# Patient Record
Sex: Male | Born: 1935 | Hispanic: No | Smoking: Former smoker
Health system: Southern US, Community
[De-identification: ages and names within clinical notes are randomized; demographics above are authoritative.]

## PROBLEM LIST (undated history)

## (undated) DIAGNOSIS — K222 Esophageal obstruction: Secondary | ICD-10-CM

## (undated) DIAGNOSIS — N400 Enlarged prostate without lower urinary tract symptoms: Secondary | ICD-10-CM

## (undated) DIAGNOSIS — R972 Elevated prostate specific antigen [PSA]: Secondary | ICD-10-CM

## (undated) DIAGNOSIS — K648 Other hemorrhoids: Secondary | ICD-10-CM

## (undated) DIAGNOSIS — F419 Anxiety disorder, unspecified: Secondary | ICD-10-CM

## (undated) DIAGNOSIS — K4090 Unilateral inguinal hernia, without obstruction or gangrene, not specified as recurrent: Secondary | ICD-10-CM

## (undated) DIAGNOSIS — G629 Polyneuropathy, unspecified: Secondary | ICD-10-CM

## (undated) DIAGNOSIS — M1990 Unspecified osteoarthritis, unspecified site: Secondary | ICD-10-CM

## (undated) DIAGNOSIS — I639 Cerebral infarction, unspecified: Secondary | ICD-10-CM

## (undated) DIAGNOSIS — Z860100 Personal history of colon polyps, unspecified: Secondary | ICD-10-CM

## (undated) DIAGNOSIS — D649 Anemia, unspecified: Secondary | ICD-10-CM

## (undated) DIAGNOSIS — I10 Essential (primary) hypertension: Secondary | ICD-10-CM

## (undated) DIAGNOSIS — E785 Hyperlipidemia, unspecified: Secondary | ICD-10-CM

## (undated) DIAGNOSIS — K449 Diaphragmatic hernia without obstruction or gangrene: Secondary | ICD-10-CM

## (undated) DIAGNOSIS — K8050 Calculus of bile duct without cholangitis or cholecystitis without obstruction: Secondary | ICD-10-CM

## (undated) DIAGNOSIS — K823 Fistula of gallbladder: Secondary | ICD-10-CM

## (undated) DIAGNOSIS — K219 Gastro-esophageal reflux disease without esophagitis: Secondary | ICD-10-CM

## (undated) HISTORY — DX: Gastro-esophageal reflux disease without esophagitis: K21.9

## (undated) HISTORY — DX: Anemia, unspecified: D64.9

## (undated) HISTORY — DX: Other hemorrhoids: K64.8

## (undated) HISTORY — PX: ERCP W/ SPHICTEROTOMY: SHX1523

## (undated) HISTORY — DX: Benign prostatic hyperplasia without lower urinary tract symptoms: N40.0

## (undated) HISTORY — DX: Anxiety disorder, unspecified: F41.9

## (undated) HISTORY — DX: Personal history of colonic polyps: Z86.010

## (undated) HISTORY — DX: Polyneuropathy, unspecified: G62.9

## (undated) HISTORY — DX: Calculus of bile duct without cholangitis or cholecystitis without obstruction: K80.50

## (undated) HISTORY — DX: Unilateral inguinal hernia, without obstruction or gangrene, not specified as recurrent: K40.90

## (undated) HISTORY — DX: Elevated prostate specific antigen (PSA): R97.20

## (undated) HISTORY — DX: Unspecified osteoarthritis, unspecified site: M19.90

## (undated) HISTORY — DX: Cerebral infarction, unspecified: I63.9

## (undated) HISTORY — PX: HERNIA REPAIR: SHX51

## (undated) HISTORY — PX: OTHER SURGICAL HISTORY: SHX169

## (undated) HISTORY — DX: Essential (primary) hypertension: I10

## (undated) HISTORY — DX: Hyperlipidemia, unspecified: E78.5

## (undated) HISTORY — DX: Personal history of colon polyps, unspecified: Z86.0100

## (undated) HISTORY — DX: Fistula of gallbladder: K82.3

## (undated) HISTORY — PX: CATARACT EXTRACTION, BILATERAL: SHX1313

## (undated) HISTORY — DX: Esophageal obstruction: K22.2

## (undated) HISTORY — DX: Diaphragmatic hernia without obstruction or gangrene: K44.9

---

## 1997-04-12 ENCOUNTER — Encounter: Admission: RE | Admit: 1997-04-12 | Discharge: 1997-04-12 | Payer: Self-pay | Admitting: Hematology and Oncology

## 2001-03-17 ENCOUNTER — Encounter: Payer: Self-pay | Admitting: Gastroenterology

## 2001-03-17 ENCOUNTER — Ambulatory Visit (HOSPITAL_COMMUNITY): Admission: RE | Admit: 2001-03-17 | Discharge: 2001-03-17 | Payer: Self-pay | Admitting: Gastroenterology

## 2001-08-23 ENCOUNTER — Ambulatory Visit (HOSPITAL_COMMUNITY): Admission: RE | Admit: 2001-08-23 | Discharge: 2001-08-23 | Payer: Self-pay | Admitting: Internal Medicine

## 2001-08-23 ENCOUNTER — Encounter: Payer: Self-pay | Admitting: Internal Medicine

## 2001-10-12 ENCOUNTER — Ambulatory Visit (HOSPITAL_COMMUNITY): Admission: RE | Admit: 2001-10-12 | Discharge: 2001-10-12 | Payer: Self-pay | Admitting: Ophthalmology

## 2001-10-17 ENCOUNTER — Ambulatory Visit (HOSPITAL_COMMUNITY): Admission: RE | Admit: 2001-10-17 | Discharge: 2001-10-17 | Payer: Self-pay | Admitting: Ophthalmology

## 2003-10-17 ENCOUNTER — Encounter: Admission: RE | Admit: 2003-10-17 | Discharge: 2003-10-17 | Payer: Self-pay | Admitting: Family Medicine

## 2004-06-10 ENCOUNTER — Ambulatory Visit: Payer: Self-pay | Admitting: Internal Medicine

## 2004-06-13 ENCOUNTER — Ambulatory Visit: Payer: Self-pay

## 2004-06-24 ENCOUNTER — Ambulatory Visit: Payer: Self-pay | Admitting: Internal Medicine

## 2004-10-16 ENCOUNTER — Ambulatory Visit: Payer: Self-pay | Admitting: Internal Medicine

## 2004-10-22 ENCOUNTER — Ambulatory Visit: Payer: Self-pay | Admitting: Internal Medicine

## 2004-10-23 ENCOUNTER — Ambulatory Visit: Payer: Self-pay | Admitting: Internal Medicine

## 2005-03-09 ENCOUNTER — Ambulatory Visit: Payer: Self-pay | Admitting: Internal Medicine

## 2005-04-27 ENCOUNTER — Ambulatory Visit: Payer: Self-pay | Admitting: Internal Medicine

## 2005-06-10 ENCOUNTER — Ambulatory Visit: Payer: Self-pay | Admitting: Internal Medicine

## 2005-09-02 ENCOUNTER — Ambulatory Visit: Payer: Self-pay | Admitting: Internal Medicine

## 2005-09-21 ENCOUNTER — Ambulatory Visit: Payer: Self-pay | Admitting: Family Medicine

## 2006-03-08 ENCOUNTER — Ambulatory Visit: Payer: Self-pay | Admitting: Internal Medicine

## 2006-04-16 ENCOUNTER — Ambulatory Visit (HOSPITAL_COMMUNITY): Admission: RE | Admit: 2006-04-16 | Discharge: 2006-04-16 | Payer: Self-pay | Admitting: Endocrinology

## 2006-04-21 ENCOUNTER — Inpatient Hospital Stay (HOSPITAL_COMMUNITY): Admission: RE | Admit: 2006-04-21 | Discharge: 2006-04-25 | Payer: Self-pay | Admitting: Orthopedic Surgery

## 2006-04-25 ENCOUNTER — Encounter: Payer: Self-pay | Admitting: Internal Medicine

## 2006-05-06 HISTORY — PX: TOTAL KNEE ARTHROPLASTY: SHX125

## 2006-05-17 ENCOUNTER — Encounter: Admission: RE | Admit: 2006-05-17 | Discharge: 2006-07-05 | Payer: Self-pay | Admitting: Orthopedic Surgery

## 2006-05-17 DIAGNOSIS — K219 Gastro-esophageal reflux disease without esophagitis: Secondary | ICD-10-CM | POA: Insufficient documentation

## 2006-05-17 DIAGNOSIS — N400 Enlarged prostate without lower urinary tract symptoms: Secondary | ICD-10-CM | POA: Insufficient documentation

## 2006-05-17 DIAGNOSIS — K222 Esophageal obstruction: Secondary | ICD-10-CM | POA: Insufficient documentation

## 2006-05-17 DIAGNOSIS — I10 Essential (primary) hypertension: Secondary | ICD-10-CM | POA: Insufficient documentation

## 2006-05-17 DIAGNOSIS — J309 Allergic rhinitis, unspecified: Secondary | ICD-10-CM | POA: Insufficient documentation

## 2006-05-18 ENCOUNTER — Ambulatory Visit: Payer: Self-pay | Admitting: Internal Medicine

## 2006-05-18 LAB — CONVERTED CEMR LAB
BUN: 8 mg/dL (ref 6–23)
Creatinine, Ser: 0.7 mg/dL (ref 0.4–1.5)
Hgb A1c MFr Bld: 5.2 % (ref 4.6–6.0)
Potassium: 4 meq/L (ref 3.5–5.1)

## 2006-06-28 ENCOUNTER — Ambulatory Visit: Payer: Self-pay | Admitting: Internal Medicine

## 2006-06-28 DIAGNOSIS — D126 Benign neoplasm of colon, unspecified: Secondary | ICD-10-CM | POA: Insufficient documentation

## 2006-06-30 LAB — CONVERTED CEMR LAB
ALT: 15 units/L (ref 0–40)
AST: 22 units/L (ref 0–37)
Albumin: 3.9 g/dL (ref 3.5–5.2)
Alkaline Phosphatase: 54 units/L (ref 39–117)
Basophils Absolute: 0 10*3/uL (ref 0.0–0.1)
Basophils Relative: 0.8 % (ref 0.0–1.0)
Bilirubin, Direct: 0.2 mg/dL (ref 0.0–0.3)
Cholesterol: 176 mg/dL (ref 0–200)
Eosinophils Absolute: 0.2 10*3/uL (ref 0.0–0.6)
Eosinophils Relative: 3.5 % (ref 0.0–5.0)
HCT: 40.3 % (ref 39.0–52.0)
HDL: 39.3 mg/dL (ref >39.0)
Hemoglobin: 13.9 g/dL (ref 13.0–17.0)
LDL Cholesterol: 100 mg/dL — ABNORMAL HIGH (ref 0–99)
Lymphocytes Relative: 32.1 % (ref 12.0–46.0)
MCHC: 34.5 g/dL (ref 30.0–36.0)
MCV: 91.9 fL (ref 78.0–100.0)
Monocytes Absolute: 0.6 10*3/uL (ref 0.2–0.7)
Monocytes Relative: 10.1 % (ref 3.0–11.0)
Neutro Abs: 3.1 10*3/uL (ref 1.4–7.7)
Neutrophils Relative %: 53.5 % (ref 43.0–77.0)
Platelets: 217 10*3/uL (ref 150–400)
RBC: 4.38 M/uL (ref 4.22–5.81)
RDW: 15.1 % — ABNORMAL HIGH (ref 11.5–14.6)
TSH: 2.63 microintl units/mL (ref 0.35–5.50)
Total Bilirubin: 1.4 mg/dL — ABNORMAL HIGH (ref 0.3–1.2)
Total CHOL/HDL Ratio: 4.5
Total Protein: 7.3 g/dL (ref 6.0–8.3)
Triglycerides: 185 mg/dL — ABNORMAL HIGH (ref 0–149)
VLDL: 37 mg/dL (ref 0–40)
WBC: 5.8 10*3/uL (ref 4.5–10.5)

## 2006-08-06 HISTORY — PX: TOTAL KNEE ARTHROPLASTY: SHX125

## 2006-08-16 ENCOUNTER — Ambulatory Visit: Payer: Self-pay | Admitting: Gastroenterology

## 2006-08-17 ENCOUNTER — Ambulatory Visit: Payer: Self-pay | Admitting: Gastroenterology

## 2006-08-20 ENCOUNTER — Encounter: Payer: Self-pay | Admitting: Internal Medicine

## 2006-08-20 ENCOUNTER — Encounter: Payer: Self-pay | Admitting: Gastroenterology

## 2006-08-20 ENCOUNTER — Ambulatory Visit: Payer: Self-pay | Admitting: Gastroenterology

## 2006-08-20 LAB — HM COLONOSCOPY: HM Colonoscopy: NORMAL

## 2006-08-24 ENCOUNTER — Ambulatory Visit: Payer: Self-pay | Admitting: Internal Medicine

## 2006-08-24 DIAGNOSIS — M1990 Unspecified osteoarthritis, unspecified site: Secondary | ICD-10-CM | POA: Insufficient documentation

## 2006-08-24 DIAGNOSIS — D649 Anemia, unspecified: Secondary | ICD-10-CM | POA: Insufficient documentation

## 2006-08-25 ENCOUNTER — Encounter: Payer: Self-pay | Admitting: Internal Medicine

## 2006-09-15 ENCOUNTER — Inpatient Hospital Stay (HOSPITAL_COMMUNITY): Admission: RE | Admit: 2006-09-15 | Discharge: 2006-09-18 | Payer: Self-pay | Admitting: Orthopedic Surgery

## 2006-10-04 ENCOUNTER — Ambulatory Visit: Payer: Self-pay | Admitting: Internal Medicine

## 2006-10-06 ENCOUNTER — Encounter (INDEPENDENT_AMBULATORY_CARE_PROVIDER_SITE_OTHER): Payer: Self-pay | Admitting: *Deleted

## 2006-10-06 LAB — CONVERTED CEMR LAB
BUN: 10 mg/dL (ref 6–23)
CO2: 31 meq/L (ref 19–32)
Calcium: 8.9 mg/dL (ref 8.4–10.5)
Chloride: 96 meq/L (ref 96–112)
Creatinine, Ser: 0.7 mg/dL (ref 0.4–1.5)
GFR calc Af Amer: 140 mL/min
GFR calc non Af Amer: 116 mL/min
Glucose, Bld: 116 mg/dL — ABNORMAL HIGH (ref 70–99)
Hemoglobin: 12.5 g/dL — ABNORMAL LOW (ref 13.0–17.0)
Potassium: 3.8 meq/L (ref 3.5–5.1)
Sodium: 132 meq/L — ABNORMAL LOW (ref 135–145)

## 2006-10-11 ENCOUNTER — Encounter: Admission: RE | Admit: 2006-10-11 | Discharge: 2006-11-24 | Payer: Self-pay | Admitting: Orthopedic Surgery

## 2006-10-25 ENCOUNTER — Ambulatory Visit: Payer: Self-pay | Admitting: Gastroenterology

## 2006-11-04 ENCOUNTER — Ambulatory Visit: Payer: Self-pay | Admitting: Internal Medicine

## 2006-11-04 DIAGNOSIS — R42 Dizziness and giddiness: Secondary | ICD-10-CM | POA: Insufficient documentation

## 2006-11-04 LAB — CONVERTED CEMR LAB: Hemoglobin: 14.4 g/dL

## 2006-11-09 ENCOUNTER — Ambulatory Visit: Payer: Self-pay | Admitting: Cardiology

## 2007-03-10 DIAGNOSIS — K648 Other hemorrhoids: Secondary | ICD-10-CM | POA: Insufficient documentation

## 2007-03-10 DIAGNOSIS — I6350 Cerebral infarction due to unspecified occlusion or stenosis of unspecified cerebral artery: Secondary | ICD-10-CM | POA: Insufficient documentation

## 2007-05-04 ENCOUNTER — Encounter: Payer: Self-pay | Admitting: Internal Medicine

## 2007-05-23 ENCOUNTER — Ambulatory Visit: Payer: Self-pay | Admitting: Internal Medicine

## 2007-05-23 ENCOUNTER — Telehealth (INDEPENDENT_AMBULATORY_CARE_PROVIDER_SITE_OTHER): Payer: Self-pay | Admitting: *Deleted

## 2007-06-02 ENCOUNTER — Telehealth (INDEPENDENT_AMBULATORY_CARE_PROVIDER_SITE_OTHER): Payer: Self-pay | Admitting: *Deleted

## 2007-06-02 ENCOUNTER — Ambulatory Visit: Payer: Self-pay | Admitting: Internal Medicine

## 2007-06-02 DIAGNOSIS — E785 Hyperlipidemia, unspecified: Secondary | ICD-10-CM | POA: Insufficient documentation

## 2007-06-02 DIAGNOSIS — R739 Hyperglycemia, unspecified: Secondary | ICD-10-CM | POA: Insufficient documentation

## 2007-06-06 ENCOUNTER — Telehealth (INDEPENDENT_AMBULATORY_CARE_PROVIDER_SITE_OTHER): Payer: Self-pay | Admitting: *Deleted

## 2007-06-10 ENCOUNTER — Telehealth (INDEPENDENT_AMBULATORY_CARE_PROVIDER_SITE_OTHER): Payer: Self-pay | Admitting: *Deleted

## 2007-09-14 ENCOUNTER — Ambulatory Visit: Payer: Self-pay | Admitting: Internal Medicine

## 2007-09-16 ENCOUNTER — Ambulatory Visit: Payer: Self-pay | Admitting: Internal Medicine

## 2007-09-22 ENCOUNTER — Telehealth (INDEPENDENT_AMBULATORY_CARE_PROVIDER_SITE_OTHER): Payer: Self-pay | Admitting: *Deleted

## 2007-09-22 LAB — CONVERTED CEMR LAB
ALT: 21 units/L (ref 0–53)
AST: 25 units/L (ref 0–37)
BUN: 12 mg/dL (ref 6–23)
CO2: 29 meq/L (ref 19–32)
Calcium: 8.9 mg/dL (ref 8.4–10.5)
Chloride: 104 meq/L (ref 96–112)
Cholesterol: 87 mg/dL (ref 0–200)
Creatinine, Ser: 0.8 mg/dL (ref 0.4–1.5)
GFR calc Af Amer: 120 mL/min
GFR calc non Af Amer: 99 mL/min
Glucose, Bld: 103 mg/dL — ABNORMAL HIGH (ref 70–99)
HDL: 30.6 mg/dL — ABNORMAL LOW (ref >39.0)
Hgb A1c MFr Bld: 5.4 % (ref 4.6–6.0)
LDL Cholesterol: 40 mg/dL (ref 0–99)
Potassium: 3.9 meq/L (ref 3.5–5.1)
Sodium: 138 meq/L (ref 135–145)
Total CHOL/HDL Ratio: 2.8
Triglycerides: 80 mg/dL (ref 0–149)
VLDL: 16 mg/dL (ref 0–40)
Vit D, 1,25-Dihydroxy: 58 (ref 30–89)

## 2007-09-28 ENCOUNTER — Ambulatory Visit: Payer: Self-pay | Admitting: Internal Medicine

## 2007-11-10 ENCOUNTER — Encounter: Payer: Self-pay | Admitting: Internal Medicine

## 2007-11-10 ENCOUNTER — Ambulatory Visit: Payer: Self-pay | Admitting: Internal Medicine

## 2007-11-28 ENCOUNTER — Encounter (INDEPENDENT_AMBULATORY_CARE_PROVIDER_SITE_OTHER): Payer: Self-pay | Admitting: *Deleted

## 2007-12-19 ENCOUNTER — Ambulatory Visit: Payer: Self-pay | Admitting: Internal Medicine

## 2007-12-19 LAB — CONVERTED CEMR LAB
Bilirubin Urine: NEGATIVE
Blood in Urine, dipstick: NEGATIVE
Glucose, Urine, Semiquant: NEGATIVE
Ketones, urine, test strip: NEGATIVE
Nitrite: NEGATIVE
Protein, U semiquant: NEGATIVE
Specific Gravity, Urine: 1.01
Urobilinogen, UA: 0.2
WBC Urine, dipstick: NEGATIVE
pH: 6

## 2008-01-18 ENCOUNTER — Telehealth (INDEPENDENT_AMBULATORY_CARE_PROVIDER_SITE_OTHER): Payer: Self-pay | Admitting: *Deleted

## 2008-01-19 ENCOUNTER — Telehealth (INDEPENDENT_AMBULATORY_CARE_PROVIDER_SITE_OTHER): Payer: Self-pay | Admitting: *Deleted

## 2008-03-13 ENCOUNTER — Encounter: Payer: Self-pay | Admitting: Internal Medicine

## 2008-03-14 ENCOUNTER — Encounter: Payer: Self-pay | Admitting: Internal Medicine

## 2008-03-14 LAB — CONVERTED CEMR LAB
ALT: 55 units/L
AST: 35 units/L
Alkaline Phosphatase: 53 units/L
BUN: 17 mg/dL
Calcium: 9 mg/dL
Cholesterol: 117 mg/dL
Creatinine, Ser: 0.9 mg/dL
Glucose, Bld: 120 mg/dL
HDL: 38 mg/dL
Hemoglobin: 13.5 g/dL
Hgb A1c MFr Bld: 5.7 %
LDL Cholesterol: 61 mg/dL
Platelets: 168 10*3/uL
Potassium, serum: 3.4 mmol/L
Sed Rate: 27 mm/hr
Total Bilirubin: 1.3 mg/dL
Triglycerides: 91 mg/dL
WBC, blood: 5.33 10*3/uL

## 2008-03-26 ENCOUNTER — Ambulatory Visit: Payer: Self-pay | Admitting: Internal Medicine

## 2008-03-26 DIAGNOSIS — F411 Generalized anxiety disorder: Secondary | ICD-10-CM | POA: Insufficient documentation

## 2008-03-26 LAB — CONVERTED CEMR LAB
Glucose, Urine, Semiquant: NEGATIVE
Nitrite: NEGATIVE
Protein, U semiquant: NEGATIVE
Specific Gravity, Urine: 1.015
Urobilinogen, UA: 0.2
pH: 6.5

## 2008-03-27 ENCOUNTER — Encounter: Payer: Self-pay | Admitting: Internal Medicine

## 2008-03-27 LAB — CONVERTED CEMR LAB
Bacteria, UA: NONE SEEN
RBC / HPF: NONE SEEN (ref <3)

## 2008-03-28 ENCOUNTER — Encounter (INDEPENDENT_AMBULATORY_CARE_PROVIDER_SITE_OTHER): Payer: Self-pay | Admitting: *Deleted

## 2008-03-28 ENCOUNTER — Encounter: Payer: Self-pay | Admitting: Internal Medicine

## 2008-03-28 LAB — CONVERTED CEMR LAB
ALT: 20 units/L (ref 0–53)
AST: 25 units/L (ref 0–37)
Basophils Absolute: 0.1 10*3/uL (ref 0.0–0.1)
Basophils Relative: 0.9 % (ref 0.0–3.0)
Eosinophils Absolute: 0.1 10*3/uL (ref 0.0–0.7)
Eosinophils Relative: 1.7 % (ref 0.0–5.0)
HCT: 40.2 % (ref 39.0–52.0)
Hemoglobin: 14.2 g/dL (ref 13.0–17.0)
Iron: 94 ug/dL (ref 42–165)
Lymphocytes Relative: 30.5 % (ref 12.0–46.0)
Lymphs Abs: 1.9 10*3/uL (ref 0.7–4.0)
MCHC: 35.3 g/dL (ref 30.0–36.0)
MCV: 94.7 fL (ref 78.0–100.0)
Monocytes Absolute: 0.8 10*3/uL (ref 0.1–1.0)
Monocytes Relative: 13.2 % — ABNORMAL HIGH (ref 3.0–12.0)
Neutro Abs: 3.4 10*3/uL (ref 1.4–7.7)
Neutrophils Relative %: 53.7 % (ref 43.0–77.0)
Platelets: 218 10*3/uL (ref 150.0–400.0)
RBC: 4.25 M/uL (ref 4.22–5.81)
RDW: 12.6 % (ref 11.5–14.6)
Saturation Ratios: 23.8 % (ref 20.0–50.0)
Transferrin: 282 mg/dL (ref 212.0–360.0)
WBC: 6.3 10*3/uL (ref 4.5–10.5)

## 2008-04-12 ENCOUNTER — Ambulatory Visit: Payer: Self-pay | Admitting: Gastroenterology

## 2008-04-12 ENCOUNTER — Telehealth: Payer: Self-pay | Admitting: Gastroenterology

## 2008-04-13 ENCOUNTER — Encounter: Payer: Self-pay | Admitting: Gastroenterology

## 2008-04-13 ENCOUNTER — Ambulatory Visit (HOSPITAL_COMMUNITY): Admission: RE | Admit: 2008-04-13 | Discharge: 2008-04-13 | Payer: Self-pay | Admitting: Gastroenterology

## 2008-04-13 LAB — CONVERTED CEMR LAB
ALT: 245 U/L — ABNORMAL HIGH
AST: 246 U/L — ABNORMAL HIGH
Albumin: 3.6 g/dL
Alkaline Phosphatase: 123 U/L — ABNORMAL HIGH
Amylase: 52 U/L
Bilirubin, Direct: 4.8 mg/dL — ABNORMAL HIGH
Lipase: 21 U/L
Total Bilirubin: 8.2 mg/dL — ABNORMAL HIGH
Total Protein: 7 g/dL

## 2008-04-16 ENCOUNTER — Telehealth: Payer: Self-pay | Admitting: Gastroenterology

## 2008-04-18 ENCOUNTER — Ambulatory Visit: Payer: Self-pay | Admitting: Gastroenterology

## 2008-04-18 LAB — CONVERTED CEMR LAB
ALT: 56 units/L — ABNORMAL HIGH (ref 0–53)
AST: 28 units/L (ref 0–37)
Albumin: 3.6 g/dL (ref 3.5–5.2)
Alkaline Phosphatase: 70 units/L (ref 39–117)
Bilirubin, Direct: 0.6 mg/dL — ABNORMAL HIGH (ref 0.0–0.3)
Total Bilirubin: 2.2 mg/dL — ABNORMAL HIGH (ref 0.3–1.2)
Total Protein: 7 g/dL (ref 6.0–8.3)

## 2008-04-26 ENCOUNTER — Ambulatory Visit: Payer: Self-pay | Admitting: Internal Medicine

## 2008-05-07 ENCOUNTER — Ambulatory Visit: Payer: Self-pay | Admitting: Gastroenterology

## 2008-05-07 LAB — CONVERTED CEMR LAB
ALT: 22 units/L (ref 0–53)
AST: 26 units/L (ref 0–37)
Albumin: 3.8 g/dL (ref 3.5–5.2)
Alkaline Phosphatase: 38 units/L — ABNORMAL LOW (ref 39–117)
Bilirubin, Direct: 0.4 mg/dL — ABNORMAL HIGH (ref 0.0–0.3)
Total Bilirubin: 1.6 mg/dL — ABNORMAL HIGH (ref 0.3–1.2)
Total Protein: 6.6 g/dL (ref 6.0–8.3)

## 2008-05-09 ENCOUNTER — Encounter (INDEPENDENT_AMBULATORY_CARE_PROVIDER_SITE_OTHER): Payer: Self-pay | Admitting: *Deleted

## 2008-05-09 ENCOUNTER — Ambulatory Visit: Payer: Self-pay | Admitting: Gastroenterology

## 2008-10-25 ENCOUNTER — Encounter (INDEPENDENT_AMBULATORY_CARE_PROVIDER_SITE_OTHER): Payer: Self-pay | Admitting: *Deleted

## 2008-11-14 ENCOUNTER — Telehealth (INDEPENDENT_AMBULATORY_CARE_PROVIDER_SITE_OTHER): Payer: Self-pay | Admitting: *Deleted

## 2009-01-05 HISTORY — PX: CATARACT EXTRACTION: SUR2

## 2009-02-19 ENCOUNTER — Telehealth (INDEPENDENT_AMBULATORY_CARE_PROVIDER_SITE_OTHER): Payer: Self-pay | Admitting: *Deleted

## 2009-02-19 ENCOUNTER — Ambulatory Visit: Payer: Self-pay | Admitting: Internal Medicine

## 2009-02-19 LAB — HM DIABETES FOOT EXAM

## 2009-02-21 LAB — CONVERTED CEMR LAB
ALT: 24 units/L (ref 0–53)
AST: 27 units/L (ref 0–37)
BUN: 14 mg/dL (ref 6–23)
Basophils Absolute: 0 10*3/uL (ref 0.0–0.1)
Basophils Relative: 0.5 % (ref 0.0–3.0)
CO2: 31 meq/L (ref 19–32)
Calcium: 9.5 mg/dL (ref 8.4–10.5)
Chloride: 99 meq/L (ref 96–112)
Creatinine, Ser: 0.8 mg/dL (ref 0.4–1.5)
Eosinophils Absolute: 0.2 10*3/uL (ref 0.0–0.7)
Eosinophils Relative: 3.3 % (ref 0.0–5.0)
GFR calc non Af Amer: 98.7 mL/min (ref >60)
Glucose, Bld: 99 mg/dL (ref 70–99)
HCT: 41.9 % (ref 39.0–52.0)
Hemoglobin: 14.2 g/dL (ref 13.0–17.0)
Hgb A1c MFr Bld: 5.5 % (ref 4.6–6.5)
Lymphocytes Relative: 27.7 % (ref 12.0–46.0)
Lymphs Abs: 2.1 10*3/uL (ref 0.7–4.0)
MCHC: 34 g/dL (ref 30.0–36.0)
MCV: 96.1 fL (ref 78.0–100.0)
Monocytes Absolute: 0.9 10*3/uL (ref 0.1–1.0)
Monocytes Relative: 12.1 % — ABNORMAL HIGH (ref 3.0–12.0)
Neutro Abs: 4.3 10*3/uL (ref 1.4–7.7)
Neutrophils Relative %: 56.4 % (ref 43.0–77.0)
Platelets: 181 10*3/uL (ref 150.0–400.0)
Potassium: 4.2 meq/L (ref 3.5–5.1)
RBC: 4.36 M/uL (ref 4.22–5.81)
RDW: 12.6 % (ref 11.5–14.6)
Sodium: 135 meq/L (ref 135–145)
TSH: 2.76 microintl units/mL (ref 0.35–5.50)
WBC: 7.5 10*3/uL (ref 4.5–10.5)

## 2009-03-12 ENCOUNTER — Ambulatory Visit: Payer: Self-pay | Admitting: Internal Medicine

## 2009-03-13 ENCOUNTER — Ambulatory Visit: Payer: Self-pay | Admitting: Internal Medicine

## 2009-03-15 LAB — CONVERTED CEMR LAB
Cholesterol: 114 mg/dL (ref 0–200)
HDL: 45.2 mg/dL (ref >39.00)
LDL Cholesterol: 40 mg/dL (ref 0–99)
Total CHOL/HDL Ratio: 3
Triglycerides: 144 mg/dL (ref 0.0–149.0)
VLDL: 28.8 mg/dL (ref 0.0–40.0)

## 2009-11-05 ENCOUNTER — Ambulatory Visit: Payer: Self-pay | Admitting: Internal Medicine

## 2009-11-05 ENCOUNTER — Telehealth: Payer: Self-pay | Admitting: Internal Medicine

## 2009-11-06 LAB — CONVERTED CEMR LAB
BUN: 14 mg/dL (ref 6–23)
CO2: 31 meq/L (ref 19–32)
Calcium: 9.6 mg/dL (ref 8.4–10.5)
Chloride: 100 meq/L (ref 96–112)
Creatinine, Ser: 0.9 mg/dL (ref 0.4–1.5)
GFR calc non Af Amer: 83.85 mL/min (ref >60)
Glucose, Bld: 103 mg/dL — ABNORMAL HIGH (ref 70–99)
Potassium: 4.9 meq/L (ref 3.5–5.1)
Sodium: 137 meq/L (ref 135–145)

## 2009-12-04 ENCOUNTER — Ambulatory Visit: Payer: Self-pay | Admitting: Internal Medicine

## 2009-12-04 DIAGNOSIS — J069 Acute upper respiratory infection, unspecified: Secondary | ICD-10-CM | POA: Insufficient documentation

## 2010-01-20 ENCOUNTER — Telehealth: Payer: Self-pay | Admitting: Internal Medicine

## 2010-01-26 ENCOUNTER — Encounter: Payer: Self-pay | Admitting: Gastroenterology

## 2010-01-28 ENCOUNTER — Telehealth: Payer: Self-pay | Admitting: Internal Medicine

## 2010-02-02 LAB — CONVERTED CEMR LAB
BUN: 11 mg/dL (ref 6–23)
Basophils Absolute: 0.1 10*3/uL (ref 0.0–0.1)
Basophils Relative: 1.3 % — ABNORMAL HIGH (ref 0.0–1.0)
CO2: 29 meq/L (ref 19–32)
Calcium: 9 mg/dL (ref 8.4–10.5)
Chloride: 103 meq/L (ref 96–112)
Creatinine, Ser: 0.9 mg/dL (ref 0.4–1.5)
Eosinophils Absolute: 0.2 10*3/uL (ref 0.0–0.6)
Eosinophils Relative: 3.5 % (ref 0.0–5.0)
GFR calc Af Amer: 105 mL/min
GFR calc non Af Amer: 87 mL/min
Glucose, Bld: 110 mg/dL — ABNORMAL HIGH (ref 70–99)
HCT: 38.1 % — ABNORMAL LOW (ref 39.0–52.0)
Hemoglobin: 13.2 g/dL (ref 13.0–17.0)
Hgb A1c MFr Bld: 5.2 % (ref 4.6–6.0)
Lymphocytes Relative: 29 % (ref 12.0–46.0)
MCHC: 34.8 g/dL (ref 30.0–36.0)
MCV: 92.4 fL (ref 78.0–100.0)
Monocytes Absolute: 0.4 10*3/uL (ref 0.2–0.7)
Monocytes Relative: 6.4 % (ref 3.0–11.0)
Neutro Abs: 3.6 10*3/uL (ref 1.4–7.7)
Neutrophils Relative %: 59.8 % (ref 43.0–77.0)
Platelets: 190 10*3/uL (ref 150–400)
Potassium: 3.9 meq/L (ref 3.5–5.1)
RBC: 4.12 M/uL — ABNORMAL LOW (ref 4.22–5.81)
RDW: 13.6 % (ref 11.5–14.6)
Sodium: 139 meq/L (ref 135–145)
WBC: 6.1 10*3/uL (ref 4.5–10.5)

## 2010-02-05 HISTORY — PX: CARDIOVASCULAR STRESS TEST: SHX262

## 2010-02-06 NOTE — Progress Notes (Signed)
Summary: NEEDS PAPERWORK REPRINTED  Phone Note Call from Patient   Caller: Patient Summary of Call: PATIENT IS IN LOBBY--HAS LOST ALL PAPERWORK AND INSTRUCTION SHEETS GIVEN TO HIM LAST WEEK FOR HIS PHYSICAL--NEEDS TO HAVE THESE PAPERS REPRINTED--WANTS TO WAIT IN LOBBY Initial call taken by: Jerolyn Shin,  June 06, 2007 12:47 PM  Follow-up for Phone Call        printed patient instructions & gave to pt Follow-up by: Shary Decamp,  June 06, 2007 4:06 PM

## 2010-02-06 NOTE — Letter (Signed)
Summary: Primary Care Appointment Letter  [facility]  [address] [place], [place] [address]   Phone: [phone]  Fax: [phone]    10/25/2008 MRN: [id number]  [name] [address] [place], [place]  [address]  Dear Mr. Rozell Searing,   Your Primary Care Physician Toronto E. Paz MD has indicated that:    ____X___it is time to schedule an appointment.    _______you missed your appointment on______ and need to call and          reschedule.    _______you need to have lab work done.    _______you need to schedule an appointment discuss lab or test results.    _______you need to call to reschedule your appointment that is                       scheduled on _________.     Please call our office as soon as possible. Our phone number is [phone]          _________. Please press option 1. Our office is open 8a-12noon and 1p-5p, Monday through Friday.     Thank you,    [facility]

## 2010-02-06 NOTE — Letter (Signed)
Summary: Results Follow up Letter  [place] [facility]  [address] [place], [place] [address]   Phone: [phone]  Fax: [phone]    03/28/2008 MRN: [id number]  [name] [address] [place], [place]  [address]  Dear Mr. Munce,  The following are the results of your recent test(s):  Test         Result    Pap Smear:        Normal _____  Not Normal _____ Comments: ______________________________________________________ Cholesterol: LDL(Bad cholesterol):         Your goal is less than:         HDL (Good cholesterol):       Your goal is more than: Comments:  ______________________________________________________ Mammogram:        Normal _____  Not Normal _____ Comments:  ___________________________________________________________________ Hemoccult:        Normal _____  Not normal _______ Comments:    _____________________________________________________________________ Other Tests:  All your lab work was normal.  Please follow up with Dr. Drue Novel in 3 months.  Please call me if you have any questions. Alena Bills

## 2010-02-06 NOTE — Progress Notes (Signed)
Summary: Paz--rx  Phone Note Refill Request   Refills Requested: Medication #1:  SIMVASTATIN 20 MG  TABS 1 by mouth at bedtime.  Medication #2:  AMLODIPINE BESYLATE 5 MG  TABS 1 by mouth once daily  Medication #3:  HYDROCHLOROTHIAZIDE 25 MG TABS 1 by mouth qd [facility] on [address] [phone] fax-[phone]--last filled--12.8.09  Initial call taken by: Freddy Jaksch,  January 19, 2008 8:13 AM  Follow-up for Phone Call        already faxed disregard per pharmacy Follow-up by: Kandice Hams,  January 19, 2008 3:57 PM

## 2010-02-06 NOTE — Miscellaneous (Signed)
Summary: BONE DENSITY  Clinical Lists Changes  Orders: Added new Test order of T-Bone Densitometry (77080) - Signed Added new Test order of T-Lumbar Vertebral Assessment (77082) - Signed 

## 2010-02-06 NOTE — Assessment & Plan Note (Signed)
Summary: EMP-YEARLY--N/S//PH   Vital Signs:  Patient Profile:   77 Years Old Male Weight:      133 pounds Pulse rate:   82 / minute BP sitting:   142 / 76  Vitals Entered By: Shary Decamp (Jun 02, 2007 9:57 AM)             Comments BP READINGS: 05/24/07 126/65 p 80 05/27/07 125/59 p 67 05/31/07 117/65 p 62 06/01/07 108/57 p 71  BS READINGS: 05/25/07 103 - fasting 05/28/07 128 - after dinner 05/28/07  99 - fasting 05/29/07 104 - fasting 05/31/07 114 - fasting 06/02/07  115 - fasting ...........Marland KitchenShary Decamp  Jun 02, 2007 9:59 AM      Chief Complaint:  yearly - not fasting - see comments.  History of Present Illness: yearly - not fasting - see comments had neck pain, HA, dizziness while in [place] last month, saw a local MD W/U was done, see below Was Rx Vid D and amlodipine he suggested decrease HCTZ and increase benicar due to low Na, start crestor now feels better  better  DIABETES MELLITUS-- see above CBGs BENIGN PROSTATIC HYPERTROPHY--Asx, sees urology HYPERTENSION --good amb BPs GERD--  essentially Asx       Updated Prior Medication List: HYDROCHLOROTHIAZIDE 25 MG TABS (HYDROCHLOROTHIAZIDE) 1 by mouth qd ATENOLOL 50 MG TABS (ATENOLOL) 1 by mouth qd PRILOSEC 20 MG  CPDR (OMEPRAZOLE) 1 by mouth BID BENICAR 20 MG  TABS (OLMESARTAN MEDOXOMIL) 1 by mouth once daily FLOMAX 0.4 MG  CP24 (TAMSULOSIN HCL) 1 by mouth once daily * CALCIUM  * MULIT-VITAMIN  BAYER LOW STRENGTH 81 MG  TBEC (ASPIRIN)  AMLODIPINE BESYLATE 5 MG  TABS (AMLODIPINE BESYLATE) 1 by mouth once daily * VITAMIN D 1.25 2 x/wk  Current Allergies (reviewed today): No known allergies   Past Medical History:    Reviewed history from 05/23/2007 and no changes required:       Allergic rhinitis       GERD w/ esoph stricture       DM II       Hypertension       Benign prostatic hypertrophy       Osteoarthritis       h/o CVA, per CT "old stroke"       INTERNAL HEMORRHOIDS (ICD-455.0)       h/o   ANEMIA NOS (ICD-285.9)       COLONIC POLYPS (ICD-211.3)       DEXA normal 10-07       NCS 5-07: polyneuropathy, ?diabetic       6-06 (-) cardiolite       Diabetes mellitus, type II       Hyperlipidemia  Past Surgical History:    Reviewed history from 10/04/2006 and no changes required:       Total knee replacement, 05-2006 (R)       Total knee replacement, 08-2006 (L)       BIL. Inguinal hernia repair   Family History:    N. C.  Social History:    Reviewed history from 06/28/2006 and no changes required:       Retired       Married       has two children, one in [place] and one in [place]   Risk Factors: Tobacco use:  never Alcohol use:  yes  Colonoscopy History:     Date of Last Colonoscopy:  08/20/2006    Results:  normal    Review of Systems  CV      Denies chest pain or discomfort and swelling of feet.  Resp      Denies cough, shortness of breath, and wheezing.  GI      Denies bloody stools and diarrhea.      c/o constipation x 1 week (on CCB x 1 month)  GU      Denies hematuria.  Psych      Denies anxiety and depression.   Physical Exam  General:     alert, well-developed, and well-nourished.   Neck:     no thyromegaly and normal carotid upstroke.   Lungs:     Normal respiratory effort, chest expands symmetrically. Lungs are clear to auscultation, no crackles or wheezes. Heart:     Normal rate and regular rhythm. S1 and S2 normal without gallop, murmur, click, rub or other extra sounds. Abdomen:     soft, non-tender, no hepatomegaly, and no splenomegaly.   Extremities:     no pretibial edema Neurologic:     alert & oriented X3, strength normal in all extremities, and gait normal.  speach fluent Psych:     memory intact for recent and remote, normally interactive, good eye contact, not anxious appearing, and not depressed appearing.      Impression & Recommendations:  Problem # 1:  Records  from [place] reviewed  05-03-07: Carotid u/s  w/minimal plaque B, EKG NSR , no acute 05-04-07: Na 131 , K 4.7, Cr 0.8, AST ALT Nl, Bil 2.3, CBC nl , Hg 15.6, UA neg, TC 204, HDL 56, LDL 129, TG 96, A1C 5.4, Vit D 16.2  (low)    Problem # 2:  HEALTH SCREENING (ICD-V70.0) EKG done  3-08 Tetanus/Td # 1:  Historical (11/26/1998) Pneumovax # 1:  Historical (10/05/2004) Clonoscopy 08/20/2006 : adenomatous polyp  Bone Density: 10/21/2005,   normal d/w pt shingles shot  Problem # 3:  DIABETES MELLITUS, TYPE II (ICD-250.00) 05-04-07: Na 131 , K 4.7, Cr 0.8, AST ALT Nl, Bil 2.3, CBC nl , Hg 15.6, UA neg, TC 204, HDL 56, LDL 129, TG 96, A1C 5.4, Vit D 16.2  (low) Well control LDL elevated Rec start Simvastin 10 His updated medication list for this problem includes:    Benicar 20 Mg Tabs (Olmesartan medoxomil) .Marland Kitchen... 1 by mouth once daily    Bayer Low Strength 81 Mg Tbec (Aspirin)   Problem # 4:  DIZZINESS (ICD-780.4) recent episode of HA, neck pain, dizzines now resolved similar sx 10-08, CT head neg reasses on rtc  Problem # 5:  HYPERTENSION (ICD-401.9) 05-04-07: Na 131 , K 4.7, Cr 0.8, AST ALT Nl, Bil 2.3, CBC nl , Hg 15.6, UA neg, TC 204, HDL 56, LDL 129, TG 96, A1C 5.4, Vit D 16.2  (low) BP slt high today started Amlodipine 1 month ago Na slt low: rec re asses in 6 weeks also c/o mild constipation, may be related to Amlodipine reasses on RTC His updated medication list for this problem includes:    Hydrochlorothiazide 25 Mg Tabs (Hydrochlorothiazide) .Marland Kitchen... 1 by mouth qd    Atenolol 50 Mg Tabs (Atenolol) .Marland Kitchen... 1 by mouth qd    Benicar 20 Mg Tabs (Olmesartan medoxomil) .Marland Kitchen... 1 by mouth once daily    Amlodipine Besylate 5 Mg Tabs (Amlodipine besylate) .Marland Kitchen... 1 by mouth once daily  BP today: 142/76 Prior BP: 100/52 (05/23/2007)  Labs Reviewed: Creat: 0.7 (10/04/2006) Chol: 176 (06/28/2006)   HDL: 39.3 (06/28/2006)   LDL: 100 (06/28/2006)   TG:  185 (06/28/2006)   Problem # 6:  UNSPECIFIED VITAMIN D DEFICIENCY (ICD-268.9) low  Vit D on supplements  Problem # 7:  HYPERLIPIDEMIA (ICD-272.4) 05-04-07: Na 131 , K 4.7, Cr 0.8, AST ALT Nl, Bil 2.3, CBC nl , Hg 15.6, UA neg, TC 204, HDL 56, LDL 129, TG 96, A1C 5.4, Vit D 16.2  (low) start statins His updated medication list for this problem includes:    Simvastatin 20 Mg Tabs (Simvastatin) .Marland Kitchen... 1 by mouth at bedtime  Labs Reviewed: Chol: 176 (06/28/2006)   HDL: 39.3 (06/28/2006)   LDL: 100 (06/28/2006)   TG: 185 (06/28/2006) SGOT: 22 (06/28/2006)   SGPT: 15 (06/28/2006)   Problem # 8:  F2F >30 min due to chart review and review of XR and labs done elsewhere  Complete Medication List: 1)  Hydrochlorothiazide 25 Mg Tabs (Hydrochlorothiazide) .Marland Kitchen.. 1 by mouth qd 2)  Atenolol 50 Mg Tabs (Atenolol) .Marland Kitchen.. 1 by mouth qd 3)  Prilosec 20 Mg Cpdr (Omeprazole) .Marland Kitchen.. 1 by mouth bid 4)  Benicar 20 Mg Tabs (Olmesartan medoxomil) .Marland Kitchen.. 1 by mouth once daily 5)  Flomax 0.4 Mg Cp24 (Tamsulosin hcl) .Marland Kitchen.. 1 by mouth once daily 6)  Calcium  7)  Mulit-vitamin  8)  Bayer Low Strength 81 Mg Tbec (Aspirin) 9)  Amlodipine Besylate 5 Mg Tabs (Amlodipine besylate) .Marland Kitchen.. 1 by mouth once daily 10)  Vitamin D 1.25  .... 2 x/wk 11)  Simvastatin 20 Mg Tabs (Simvastatin) .Marland Kitchen.. 1 by mouth at bedtime   Patient Instructions: 1)  take Vit D 2)  Start simvastatin at night, call if problems (muscle aches) 3)  call if constipation no better with OTC (Milk of Magnesia) 4)  Please schedule a follow-up appointment in  4 to 6  weeks.   Prescriptions: SIMVASTATIN 20 MG  TABS (SIMVASTATIN) 1 by mouth at bedtime  #30 x 3   Entered and Authorized by:   Nolon Rod. Paz MD   Signed by:   Nolon Rod. Paz MD on 06/02/2007   Method used:   Print then Give to Patient   RxID:   [id number]  ]  Tetanus/Td Immunization History:    Tetanus/Td # 1:  Historical (11/26/1998)  Pneumovax Immunization History:    Pneumovax # 1:  Historical (10/05/2004)     Preventive Care Screening  Colonoscopy:    Date:   08/20/2006    Results:  normal  Bone Density:    Date:  10/21/2005    Results:  normal  Last Pneumovax:    Date:  10/05/2004    Results:  Historical  Last Tetanus Booster:    Date:  11/26/1998    Results:  Historical

## 2010-02-06 NOTE — Assessment & Plan Note (Signed)
Summary: followup; fasting lab  FLP   dx  hyperlipidiemia///sph   Vital Signs:  Patient profile:   77 year old male Weight:      129.13 pounds Pulse rate:   72 / minute Pulse rhythm:   regular BP sitting:   130 / 72  (left arm) Cuff size:   regular  Vitals Entered By: Army Fossa CMA (November 05, 2009 8:11 AM) CC: 6 month f/u- fasting    History of Present Illness: ROV  GERD-- symptoms well controlled at present, + symptoms if misses PPIs DM-- CBGs  < 110 in AM, < 130 post prandial  Hypertension-- ambulatory BPs WNL  Osteoarthritis-- hadd foot pain, saw a podiatrist, got a local injection and Rx mobic which is helping       Current Medications (verified): 1)  Hydrochlorothiazide 25 Mg Tabs (Hydrochlorothiazide) .... 1/2 By Mouth Once Daily 2)  Atenolol 50 Mg Tabs (Atenolol) .... 1/2 By Mouth Once Daily 3)  Prilosec 20 Mg  Cpdr (Omeprazole) .Marland Kitchen.. 1 By Mouth Bid 4)  Benicar 20 Mg  Tabs (Olmesartan Medoxomil) .Marland Kitchen.. 1 By Mouth Once Daily - 5)  Flomax 0.4 Mg  Cp24 (Tamsulosin Hcl) .Marland Kitchen.. 1 By Mouth Once Daily 6)  Calcium 7)  Mulit-Vitamin 8)  Bayer Low Strength 81 Mg  Tbec (Aspirin) 9)  Amlodipine Besylate 5 Mg  Tabs (Amlodipine Besylate) .Marland Kitchen.. 1 By Mouth Once Daily 10)  Simvastatin 20 Mg  Tabs (Simvastatin) .... 1/2  By Mouth At Bedtime 11)  Fish Oil 1000 Mg Caps (Omega-3 Fatty Acids) .... Once Daily 12)  Gnp Flax Seed Oil 1000 Mg Caps (Flaxseed (Linseed)) .... Once Daily 13)  Centrum Silver  Tabs (Multiple Vitamins-Minerals) .... Once Daily 14)  Mobic 15 Mg Tabs (Meloxicam) .Marland Kitchen.. 1 By Mouth Daily  Allergies (verified): No Known Drug Allergies  Past History:  Past Medical History: Allergic rhinitis GERD w/ esoph stricture DM II HYPERLIPIDEMIA Hypertension Benign prostatic hypertrophy Osteoarthritis h/o CVA, per CT "old stroke" INTERNAL HEMORRHOIDS   h/o  ANEMIA NOS  COLONIC POLYPS (ICD-211.3) DEXA normal 10-07 NCS 5-07: polyneuropathy, ?diabetic 6-06 (-)  cardiolite anxiety  Past Surgical History: Reviewed history from 03/12/2009 and no changes required. Total knee replacement, 05-2006 (R) Total knee replacement, 08-2006 (L) BIL. Inguinal hernia repair (per patient L in the 90s, R in the 80s) Cataract extraction (01/2009) left  Social History: Retired Married original from [place] has two children, one in [place] and one in [place] Patient has never smoked.  Alcohol Use - no exercise-- walks daily  Review of Systems CV:  Denies chest pain or discomfort and swelling of feet. Resp:  Denies cough and shortness of breath. GI:  Denies bloody stools, nausea, and vomiting.  Physical Exam  General:  alert, well-developed, and well-nourished.   Lungs:  normal respiratory effort, no intercostal retractions, no accessory muscle use, and normal breath sounds.   Heart:  normal rate, regular rhythm, and no murmur.   Abdomen:  soft, non-tender, no distention, and no masses.   Extremities:  no edema   Psych:  Oriented X3, memory intact for recent and remote, normally interactive, good eye contact, not anxious appearing, and not depressed appearing.     Impression & Recommendations:  Problem # 1:  HYPERTENSION (ICD-401.9) at goal , taking NSAIDs, check a BMP His updated medication list for this problem includes:    Hydrochlorothiazide 25 Mg Tabs (Hydrochlorothiazide) .Marland Kitchen... 1/2 by mouth once daily    Atenolol 50 Mg Tabs (Atenolol) .Marland KitchenMarland KitchenMarland KitchenMarland Kitchen  1/2 by mouth once daily    Benicar 20 Mg Tabs (Olmesartan medoxomil) .Marland Kitchen... 1 by mouth once daily -    Amlodipine Besylate 5 Mg Tabs (Amlodipine besylate) .Marland Kitchen... 1 by mouth once daily  Orders: Venipuncture ([address]) TLB-BMP (Basic Metabolic Panel-BMET) (80048-METABOL)  BP today: 130/72 Prior BP: 122/60 (03/12/2009)  Labs Reviewed: K+: 4.2 (02/19/2009) Creat: : 0.8 (02/19/2009)   Chol: 114 (03/13/2009)   HDL: 45.20 (03/13/2009)   LDL: 40 (03/13/2009)   TG: 144.0 (03/13/2009)  Problem # 2:  DIABETES  MELLITUS, TYPE II (ICD-250.00) A1C < 6 consistently  His updated medication list for this problem includes:    Benicar 20 Mg Tabs (Olmesartan medoxomil) .Marland Kitchen... 1 by mouth once daily -    Bayer Low Strength 81 Mg Tbec (Aspirin)  Labs Reviewed: Creat: 0.8 (02/19/2009)    Reviewed HgBA1c results: 5.5 (02/19/2009)  5.7 (03/14/2008)  Problem # 3:  OSTEOARTHRITIS (ICD-715.90) on mobic , warned about s/e  His updated medication list for this problem includes:    Bayer Low Strength 81 Mg Tbec (Aspirin)    Mobic 15 Mg Tabs (Meloxicam) .Marland Kitchen... 1 by mouth daily  Problem # 4:  WEIGHT LOSS, RECENT (ICD-783.21) wt varies over time, labs normal   Complete Medication List: 1)  Hydrochlorothiazide 25 Mg Tabs (Hydrochlorothiazide) .... 1/2 by mouth once daily 2)  Atenolol 50 Mg Tabs (Atenolol) .... 1/2 by mouth once daily 3)  Prilosec 20 Mg Cpdr (Omeprazole) .Marland Kitchen.. 1 by mouth bid 4)  Benicar 20 Mg Tabs (Olmesartan medoxomil) .Marland Kitchen.. 1 by mouth once daily - 5)  Flomax 0.4 Mg Cp24 (Tamsulosin hcl) .Marland Kitchen.. 1 by mouth once daily 6)  Calcium  7)  Mulit-vitamin  8)  Bayer Low Strength 81 Mg Tbec (Aspirin) 9)  Amlodipine Besylate 5 Mg Tabs (Amlodipine besylate) .Marland Kitchen.. 1 by mouth once daily 10)  Simvastatin 20 Mg Tabs (Simvastatin) .... 1/2  by mouth at bedtime 11)  Fish Oil 1000 Mg Caps (Omega-3 fatty acids) .... Once daily 12)  Gnp Flax Seed Oil 1000 Mg Caps (Flaxseed (linseed)) .... Once daily 13)  Centrum Silver Tabs (Multiple vitamins-minerals) .... Once daily 14)  Mobic 15 Mg Tabs (Meloxicam) .Marland Kitchen.. 1 by mouth daily  Patient Instructions: 1)  Please schedule a follow-up appointment in 5 months , fasting, physical exam Prescriptions: SIMVASTATIN 20 MG  TABS (SIMVASTATIN) 1/2  by mouth at bedtime  #45 x 2   Entered by:   Army Fossa CMA   Authorized by:   Nolon Rod. Paz MD   Signed by:   Army Fossa CMA on 11/05/2009   Method used:   Electronically to        [id number]* (retail)        [address]       [place], [place]  [address]       Ph: [phone] or [phone]       Fax: [phone]   RxID:   [id number] AMLODIPINE BESYLATE 5 MG  TABS (AMLODIPINE BESYLATE) 1 by mouth once daily  #90 x 2   Entered by:   Army Fossa CMA   Authorized by:   Nolon Rod. Paz MD   Signed by:   Army Fossa CMA on 11/05/2009   Method used:   Electronically to        [id number]* (retail)       [address]  [place], [place]  [address]       Ph: [phone] or [phone]       Fax: [phone]   RxID:   [id number] FLOMAX 0.4 MG  CP24 (TAMSULOSIN HCL) 1 by mouth once daily  #90 x 2   Entered by:   Army Fossa CMA   Authorized by:   Nolon Rod. Paz MD   Signed by:   Army Fossa CMA on 11/05/2009   Method used:   Electronically to        [id number]* (retail)       [address]       [place], [place]  [address]       Ph: [phone] or [phone]       Fax: [phone]   RxID:   [id number] BENICAR 20 MG  TABS (OLMESARTAN MEDOXOMIL) 1 by mouth once daily -  #90 x 2   Entered by:   Army Fossa CMA   Authorized by:   Nolon Rod. Paz MD   Signed by:   Army Fossa CMA on 11/05/2009   Method used:   Electronically to        [id number] (retail)       [address]       [place], [place]  [address]       Ph: [phone] or [phone]       Fax: [phone]   RxID:   [id number] PRILOSEC 20 MG  CPDR (OMEPRAZOLE) 1 by mouth BID  #180 x 2   Entered by:   Army Fossa CMA   Authorized by:   Nolon Rod. Paz MD   Signed by:   Army Fossa CMA on 11/05/2009   Method used:   Electronically to        [id number] (retail)       [address]       [place], [place]  [address]       Ph: [phone] or [phone]       Fax: [phone]   RxID:    [id number] ATENOLOL 50 MG TABS (ATENOLOL) 1/2 by mouth once daily  #45 x 2   Entered by:   Army Fossa CMA   Authorized by:   Nolon Rod. Paz MD   Signed by:   Army Fossa CMA on 11/05/2009   Method used:   Electronically to        [id number] (retail)       [address]       [place], [place]  [address]       Ph: [phone] or [phone]       Fax: [phone]   RxID:   [id number] HYDROCHLOROTHIAZIDE 25 MG TABS (HYDROCHLOROTHIAZIDE) 1/2 by mouth once daily  #45 x 2   Entered by:   Army Fossa CMA   Authorized by:   Nolon Rod. Paz MD   Signed by:   Army Fossa CMA on 11/05/2009   Method used:   Electronically to        [id number] (retail)       [address]       [facility]  [place], [place]  [address]       Ph: [phone] or [phone]       Fax: [phone]   RxID:   [id number]    Orders Added: 1)  Venipuncture [[address]] 2)  TLB-BMP (Basic Metabolic Panel-BMET) [80048-METABOL] 3)  Est. Patient Level IV [[address]]   Immunization History:  Influenza Immunization History:    Influenza:  historical (10/05/2009)   Immunization History:  Influenza Immunization History:    Influenza:  Historical (10/05/2009)

## 2010-02-06 NOTE — Letter (Signed)
Summary: Results Follow up Letter  [facility]  [address] [place], [place] [address]   Phone: [phone]  Fax: [phone]    10/06/2006 MRN: [id number]  [name] [address] [place], [place]  [address]  Dear Mr. Indelicato,  The following are the results of your recent test(s):  Test         Result    Pap Smear:        Normal _____  Not Normal _____ Comments: ______________________________________________________ Cholesterol: LDL(Bad cholesterol):         Your goal is less than:         HDL (Good cholesterol):       Your goal is more than: Comments:  ______________________________________________________ Mammogram:        Normal _____  Not Normal _____ Comments:  ___________________________________________________________________ Hemoccult:        Normal _____  Not normal _______ Comments:    _____________________________________________________________________ Other Tests:  LABS NORMAL SEE ATTACHED COMMENTS  We routinely do not discuss normal results over the telephone.  If you desire a copy of the results, or you have any questions about this information we can discuss them at your next office visit.   Sincerely,

## 2010-02-06 NOTE — Assessment & Plan Note (Signed)
Summary: flu shot--ph   Nurse Visit    Prior Medications: HYDROCHLOROTHIAZIDE 25 MG TABS (HYDROCHLOROTHIAZIDE) 1 by mouth qd ATENOLOL 50 MG TABS (ATENOLOL) 1 by mouth qd PRILOSEC 20 MG  CPDR (OMEPRAZOLE) 1 by mouth BID BENICAR 20 MG  TABS (OLMESARTAN MEDOXOMIL) 1 by mouth once daily FLOMAX 0.4 MG  CP24 (TAMSULOSIN HCL) 1 by mouth once daily CALCIUM ()  MULIT-VITAMIN ()  BAYER LOW STRENGTH 81 MG  TBEC (ASPIRIN)  AMLODIPINE BESYLATE 5 MG  TABS (AMLODIPINE BESYLATE) 1 by mouth once daily VITAMIN D OTC () 1 a day SIMVASTATIN 20 MG  TABS (SIMVASTATIN) 1 by mouth at bedtime Current Allergies: No known allergies    Influenza Vaccine    Vaccine Type: Fluvax MCR    Site: right deltoid    Mfr: GlaxoSmithKline    Dose: 0.5 ml    Route: IM    Given by: Doristine Devoid    Exp. Date: 07/04/2008    Lot #: [id number]   Orders Added: 1)  Influenza Vaccine MCR [00025]    ]

## 2010-02-06 NOTE — Assessment & Plan Note (Signed)
Summary: EPIGASTRIC PAIN,FEVER,NAUSEA           [name]  Medications Added FISH OIL 1000 MG CAPS (OMEGA-3 FATTY ACIDS) once daily GNP FLAX SEED OIL 1000 MG CAPS (FLAXSEED (LINSEED)) once daily CALCIUM 600/VITAMIN D 600-400 MG-UNIT TABS (CALCIUM CARBONATE-VITAMIN D) once daily CENTRUM SILVER  TABS (MULTIPLE VITAMINS-MINERALS) once daily PREVPAC  MISC (AMOXICILL-CLARITHRO-LANSOPRAZ) Take as directed      Allergies Added: NKDA  History of Present Illness Visit Type: Follow-up Visit Primary GI MD: Melvia Heaps MD [facility] Primary Provider: Willow Ora, MD Chief Complaint: adbominal pain [name] has returned for evaluation of abdominal pain.  For the last 3 days he has had moderately severe midepigastric pain.  Pain is without radiation.  This is accompanied  by nausea.  It is unaffected by eating.  He takes Prilosec regularly.  He was seen at [facility] where laboratory was drawn but  the results are not currently known.  He was given a Prevpac for presumed H. pylori infection.  There has been no change in his bowel habits.  He is on no gastric irritants including nonsteroidals.  Today his pain is still present though it is much less severe.  History is notable for evaluation approximately 3 weeks ago for similar pain while travelling  in [place].  Blood work at that time was unremarkable.   GI Review of Systems    Reports abdominal pain and  nausea.     Location of  Abdominal pain: upper abdomen.    Denies acid reflux, belching, bloating, chest pain, dysphagia with liquids, dysphagia with solids, heartburn, loss of appetite, vomiting, vomiting blood, weight loss, and  weight gain.        Denies anal fissure, black tarry stools, change in bowel habit, constipation, diarrhea, diverticulosis, fecal incontinence, heme positive stool, hemorrhoids, irritable bowel syndrome, jaundice, light color stool, liver problems, rectal bleeding, and  rectal pain. Preventive Screening-Counseling & Management     Smoking Status: never      Drug Use:  no.      Current Medications (verified): 1)  Hydrochlorothiazide 25 Mg Tabs (Hydrochlorothiazide) .Marland Kitchen.. 1 By Mouth Qd 2)  Atenolol 50 Mg Tabs (Atenolol) .Marland Kitchen.. 1 By Mouth Qd 3)  Prilosec 20 Mg  Cpdr (Omeprazole) .Marland Kitchen.. 1 By Mouth Bid 4)  Benicar 20 Mg  Tabs (Olmesartan Medoxomil) .Marland Kitchen.. 1 By Mouth Once Daily 5)  Flomax 0.4 Mg  Cp24 (Tamsulosin Hcl) .Marland Kitchen.. 1 By Mouth Once Daily 6)  Calcium 7)  Mulit-Vitamin 8)  Bayer Low Strength 81 Mg  Tbec (Aspirin) 9)  Amlodipine Besylate 5 Mg  Tabs (Amlodipine Besylate) .Marland Kitchen.. 1 By Mouth Once Daily 10)  Simvastatin 20 Mg  Tabs (Simvastatin) .... 1/2  By Mouth At Bedtime 11)  Fish Oil 1000 Mg Caps (Omega-3 Fatty Acids) .... Once Daily 12)  Gnp Flax Seed Oil 1000 Mg Caps (Flaxseed (Linseed)) .... Once Daily 13)  Calcium 600/vitamin D 600-400 Mg-Unit Tabs (Calcium Carbonate-Vitamin D) .... Once Daily 14)  Centrum Silver  Tabs (Multiple Vitamins-Minerals) .... Once Daily 15)  Prevpac  Misc (Amoxicill-Clarithro-Lansopraz) .... Take As Directed  Allergies (verified): No Known Drug Allergies  Past History:  Past Medical History:    Reviewed history from 03/26/2008 and no changes required:    Allergic rhinitis    GERD w/ esoph stricture    DM II    HYPERLIPIDEMIA    Hypertension    Benign prostatic hypertrophy    Osteoarthritis    h/o CVA, per CT "old stroke"  INTERNAL HEMORRHOIDS      h/o  ANEMIA NOS     COLONIC POLYPS (ICD-211.3)    DEXA normal 10-07    NCS 5-07: polyneuropathy, ?diabetic    6-06 (-) cardiolite    anxiety  Past Surgical History:    Reviewed history from 12/19/2007 and no changes required:    Total knee replacement, 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 Inguinal hernia repair (per patient L in the 90s, R in the 80s)  Social History:    Retired    Married    has two children, one in [place] and one in [place]    Patient has never smoked.     Alcohol Use - no     Daily Caffeine Use-2    Illicit Drug Use - no    Drug Use:  no  Review of Systems       The patient complains of anxiety-new, arthritis/joint pain, back pain, depression-new, fatigue, fever, hearing problems, thirst - excessive, and urination - excessive.    Vital Signs:  Patient profile:   77 year old male Height:      66 inches Weight:      131.38 pounds BMI:     21.28 Pulse rate:   64 / minute Pulse rhythm:   regular BP sitting:   116 / 52  (left arm)  Vitals Entered By: June McMurray CMA (April 12, 2008 1:31 PM)  Physical Exam  Additional Exam:  He is a healthy-appearing male  There is  scleral icterus skin: anicteric HEENT: normocephalic; PEERLA; no nasal or pharyngeal abnormalities neck: supple nodes: no cervical lymphadenopathy chest: clear to ausculatation and percussion heart: no murmurs, gallops, or rubs abd: soft, nontender; BS normoactive; no abdominal masses,  organomegaly; there is slight epigastric tenderness to deep palpation without guarding or rebound rectal: deferred ext: no cynanosis, clubbing, edema skeletal: no deformities neuro: oriented x 3; no focal abnormalities    Impression & Recommendations:  Problem # 1:  ABDOMINAL PAIN OTHER SPECIFIED SITE (ICD-789.09) Etiology of his recurrent abdominal pain is unclear.  Question of scleral icterus raises the concern for biliary tract disease.  Recommendations #1 check LFTs, amylase and lipase #2 abdominal ultrasound #3 the patient was instructed to contact the office immediately if he develops recurrent pain Orders: TLB-Hepatic/Liver Function Pnl (80076-HEPATIC) TLB-Amylase (82150-AMYL) TLB-Lipase (83690-LIPASE)  Patient Instructions: 1)  Continue Prilosec, discontinue Prevpac 2)  Please call if any symptoms continue 3)  You will go to the basement for labs today 4)  Your test is scheduled for 04/16/2008 at [facility] radiology 5)  The medication list was reviewed and reconciled.  All changed / newly  prescribed medications were explained.  A complete medication list was provided to the patient / caregiver.  Appended Document: Orders Update (Cancel per Dr Arlyce Dice)    Clinical Lists Changes  Orders: Added new Test order of Ultrasound Abdomen (UAS) - Signed

## 2010-02-06 NOTE — Assessment & Plan Note (Signed)
Summary: adjust bp meds//lch   Vital Signs:  Patient profile:   77 year old male Height:      66 inches Weight:      128.8 pounds Pulse rate:   64 / minute BP sitting:   118 / 56  Vitals Entered By: rachel peeler CC: f/u discuss bp medicines Comments pt. compains of sleepy when driving, tired, weak   History of Present Illness: has not been seen in a while  HTN-- discuss bp medicines c/o sleepy when driving (has not fall asleep), tired, weak, sx going on x 1 year  thinks  from BP meds  ambulatory BPs 118/50s, sometimes DBP in the 40s  denies dizzines, slurred speach, motor deficits  + snoring x years (neg  sleep studies x 3  per patient)  DM -- on diet only, ambulatory CBGs , fasting, in the low 100s  HYPERLIPIDEMIA-- good medication compliance     GERD-- symptoms well controlled   Allergies: No Known Drug Allergies  Past History:  Past Medical History: Allergic rhinitis GERD w/ esoph stricture DM II HYPERLIPIDEMIA Hypertension Benign prostatic hypertrophy Osteoarthritis h/o CVA, per CT "old stroke" INTERNAL HEMORRHOIDS   h/o  ANEMIA NOS  COLONIC POLYPS (ICD-211.3) DEXA normal 10-07 NCS 5-07: polyneuropathy, ?diabetic 6-06 (-) cardiolite anxiety  Past Surgical History: Reviewed history from 12/19/2007 and no changes required. Total knee replacement, 05-2006 (R) Total knee replacement, 08-2006 (L) BIL. Inguinal hernia repair (per patient L in the 90s, R in the 80s)  Social History: Reviewed history from 04/12/2008 and no changes required. Retired Married has two children, one in [place] and one in [place] Patient has never smoked.  Alcohol Use - no Daily Caffeine Use-2 Illicit Drug Use - no  Review of Systems CV:  Denies chest pain or discomfort and swelling of feet. Resp:  Denies cough and shortness of breath. Neuro:  R>L foot pain (paresthesia)  , thinks related to neuropathy .  Physical Exam  General:  alert, well-developed, and  well-nourished.   Eyes:  not pale Lungs:  normal respiratory effort, no intercostal retractions, no accessory muscle use, and normal breath sounds.   Heart:  normal rate, regular rhythm, and no murmur.   Genitalia:    Pulses:  normal pedal pulses bilaterally Extremities:  no edema  Diabetes Management Exam:    Foot Exam (with socks and/or shoes not present):       Sensory-Pinprick/Light touch:          Left medial foot (L-4): normal          Left dorsal foot (L-5): normal          Left lateral foot (S-1): normal          Right medial foot (L-4): normal          Right dorsal foot (L-5): normal          Right lateral foot (S-1): normal       Sensory-Monofilament:          Left foot: normal          Right foot: normal       Inspection:          Left foot: abnormal             Comments: callouses          Right foot: abnormal             Comments: callouses        Nails:  Left foot: thickened          Right foot: thickened   Impression & Recommendations:  Problem # 1:  FATIGUE (ICD-780.79)  review of systems essentially negative, symptoms ongoing for one year his diastolic blood pressure is a slightly low,adjust medications labs  Orders: TLB-CBC Platelet - w/Differential (85025-CBCD) TLB-TSH (Thyroid Stimulating Hormone) (84443-TSH)  Problem # 2:  HYPERTENSION (ICD-401.9)  decrease dose of HCTZ  and atenolol His updated medication list for this problem includes:    Hydrochlorothiazide 25 Mg Tabs (Hydrochlorothiazide) .Marland Kitchen... 1/2 by mouth once daily    Atenolol 50 Mg Tabs (Atenolol) .Marland Kitchen... 1/2 by mouth once daily    Benicar 20 Mg Tabs (Olmesartan medoxomil) .Marland Kitchen... 1 by mouth once daily - office visit for additional refills    Amlodipine Besylate 5 Mg Tabs (Amlodipine besylate) .Marland Kitchen... 1 by mouth once daily - office visit for additional refills  Orders: TLB-BMP (Basic Metabolic Panel-BMET) (80048-METABOL)  Problem # 3:  GERD (ICD-530.81) well control His updated  medication list for this problem includes:    Prilosec 20 Mg Cpdr (Omeprazole) .Marland Kitchen... 1 by mouth bid  Problem # 4:  DIABETES MELLITUS, TYPE II (ICD-250.00) c/o paresthesias, monofilament exam neg , printed material provided regards feet care  on diet only, labs His updated medication list for this problem includes:    Benicar 20 Mg Tabs (Olmesartan medoxomil) .Marland Kitchen... 1 by mouth once daily - office visit for additional refills    Bayer Low Strength 81 Mg Tbec (Aspirin)    Labs Reviewed: Creat: 0.9 (03/14/2008)    Reviewed HgBA1c results: 5.7 (03/14/2008)  5.4 (09/16/2007)  Orders: Venipuncture (57846) TLB-A1C / Hgb A1C (Glycohemoglobin) (83036-A1C)  Problem # 5:  HYPERLIPIDEMIA (ICD-272.4)  reports good medication compliance, not fasting today. FLP on return to the office His updated medication list for this problem includes:    Simvastatin 20 Mg Tabs (Simvastatin) .Marland Kitchen... 1/2  by mouth at bedtime  Orders: TLB-ALT (SGPT) (84460-ALT) TLB-AST (SGOT) (84450-SGOT)  Complete Medication List: 1)  Hydrochlorothiazide 25 Mg Tabs (Hydrochlorothiazide) .... 1/2 by mouth once daily 2)  Atenolol 50 Mg Tabs (Atenolol) .... 1/2 by mouth once daily 3)  Prilosec 20 Mg Cpdr (Omeprazole) .Marland Kitchen.. 1 by mouth bid 4)  Benicar 20 Mg Tabs (Olmesartan medoxomil) .Marland Kitchen.. 1 by mouth once daily - office visit for additional refills 5)  Flomax 0.4 Mg Cp24 (Tamsulosin hcl) .Marland Kitchen.. 1 by mouth once daily 6)  Calcium  7)  Mulit-vitamin  8)  Bayer Low Strength 81 Mg Tbec (Aspirin) 9)  Amlodipine Besylate 5 Mg Tabs (Amlodipine besylate) .Marland Kitchen.. 1 by mouth once daily - office visit for additional refills 10)  Simvastatin 20 Mg Tabs (Simvastatin) .... 1/2  by mouth at bedtime 11)  Fish Oil 1000 Mg Caps (Omega-3 fatty acids) .... Once daily 12)  Gnp Flax Seed Oil 1000 Mg Caps (Flaxseed (linseed)) .... Once daily 13)  Centrum Silver Tabs (Multiple vitamins-minerals) .... Once daily  Patient Instructions: 1)  Please schedule  a follow-up appointment in 3 to 4  months (fasting, yearly checkup) Prescriptions: ATENOLOL 50 MG TABS (ATENOLOL) 1/2 by mouth once daily  #90 x 3   Entered and Authorized by:   Nolon Rod. Paz MD   Signed by:   Nolon Rod. Paz MD on 02/19/2009   Method used:   Print then Give to Patient   RxID:   [id number] HYDROCHLOROTHIAZIDE 25 MG TABS (HYDROCHLOROTHIAZIDE) 1/2 by mouth once daily  #90 x 3   Entered and Authorized by:  Jose E. Paz MD   Signed by:   Nolon Rod. Paz MD on 02/19/2009   Method used:   Print then Give to Patient   RxID:   [id number] ATENOLOL 50 MG TABS (ATENOLOL) 1/2 by mouth once daily  #30 x 3   Entered and Authorized by:   Nolon Rod. Paz MD   Signed by:   Nolon Rod. Paz MD on 02/19/2009   Method used:   Print then Give to Patient   RxID:   [id number] HYDROCHLOROTHIAZIDE 25 MG TABS (HYDROCHLOROTHIAZIDE) 1/2 by mouth once daily  #30 x 3   Entered and Authorized by:   Nolon Rod. Paz MD   Signed by:   Nolon Rod. Paz MD on 02/19/2009   Method used:   Print then Give to Patient   RxID:   [id number]

## 2010-02-06 NOTE — Letter (Signed)
Summary: EKG,  carotid ultrasound and labs  [facility]   Imported By: Freddy Jaksch 06/02/2007 14:27:35  _____________________________________________________________________  External Attachment:    Type:   Image     Comment:   External Document

## 2010-02-06 NOTE — Assessment & Plan Note (Signed)
Summary: for stomach pain--ph   Vital Signs:  Patient profile:   77 year old male Weight:      131 pounds Temp:     97.2 degrees F oral Pulse rate:   64 / minute Pulse rhythm:   regular BP sitting:   130 / 62  (left arm) Cuff size:   regular  Vitals Entered By: Shary Decamp (March 26, 2008 11:54 AM) Comments Patient would like to f/u with Dr. Drue Novel -- pt has been in [place] for 8 weeks visiting son -- he saw a MD there for HA & abd pain.  MD ordered abd xr -- pt did not have done because he was coming back to [facility]  March 26, 2008 12:09 PM    History of Present Illness: Patient would like to f/u with Dr. Drue Novel --  pt has been in [place] for 8 weeks visiting son -- he saw a MD there for HA & abd pain.   --HA  was actually described as pain in the  neck , lasted 1 day and now is resolved  --abdominal pain was R sided, lasted 1 day and it is now resolved --MD in [place]  ordered abdominal XR and a U/S  -- pt did not have done because he was coming back to [place]  LABS form [place] dated 03-13-2008 Bil 1.3  (slt elevated), CBG 120, creat 0.9, AST 35 (normal), ALT 55 (elevated) TC 117, TG91, LDL 61, HDL 38 WBC 5.3, Hg 13.5 (his MD in [place] noted Hg before to be 15.6),platelets 168 UA trace billi, protein, 4-10 RBCs A1C 5.7 , amylase 71 (normal), lipase normal, sed rate 27, Vit D 32  HTN-- ambulatory BPs normal DM--  ambulatory CBGs < 120   Allergies: No Known Drug Allergies  Past History:  Past Medical History:    Allergic rhinitis    GERD w/ esoph stricture    DM II    HYPERLIPIDEMIA    Hypertension    Benign prostatic hypertrophy    Osteoarthritis    h/o CVA, per CT "old stroke"    INTERNAL HEMORRHOIDS      h/o  ANEMIA NOS     COLONIC POLYPS (ICD-211.3)    DEXA normal 10-07    NCS 5-07: polyneuropathy, ?diabetic    6-06 (-) cardiolite    anxiety  Social History:    Reviewed history from 06/28/2006 and no changes required:       Retired       Married       has two children, one in  [place] and one in [place]  Review of Systems General:  Denies fatigue and weight loss. GI:  Denies bloody stools, diarrhea, nausea, and vomiting.  Physical Exam  General:  alert and well-developed.   Lungs:  normal respiratory effort, no intercostal retractions, no accessory muscle use, and normal breath sounds.   Heart:  normal rate, regular rhythm, and no murmur.   Abdomen:  soft, non-tender, no distention, no masses, no hepatomegaly, and no splenomegaly.   Extremities:  no pretibial edema bilaterally    Impression & Recommendations:  Problem # 1:  ABDOMINAL PAIN OTHER SPECIFIED SITE (ICD-789.09) single episode of abdominal pain ----> resolved, extensive blood work reviewed recheck CBC LFTs which were slightly  abnormal the UA showed some RBCs, he has already an appointment w/  urology will consider a U/S or CT His updated medication list for this problem includes:    Bayer Low Strength 81 Mg  Tbec (Aspirin)  Orders: Venipuncture (44034) TLB-ALT (SGPT) (84460-ALT) TLB-AST (SGOT) (84450-SGOT) TLB-IBC Pnl (Iron/FE;Transferrin) (83550-IBC) TLB-CBC Platelet - w/Differential (85025-CBCD)  Problem # 2:  HYPERLIPIDEMIA (ICD-272.4) LABS form [place] dated 03-13-2008 AST 35 (normal), ALT 55 (elevated) TC 117, TG 91, LDL 61, HDL 38 well control, repeat LFTs  His updated medication list for this problem includes:    Simvastatin 20 Mg Tabs (Simvastatin) .Marland Kitchen... 1/2  by mouth at bedtime  Problem # 3:  DIABETES MELLITUS, TYPE II (ICD-250.00) at goal  LABS form [place] dated 03-13-2008 A1C 5.7  His updated medication list for this problem includes:    Benicar 20 Mg Tabs (Olmesartan medoxomil) .Marland Kitchen... 1 by mouth once daily    Bayer Low Strength 81 Mg Tbec (Aspirin)  Labs Reviewed: Creat: 0.8 (09/16/2007)    Reviewed HgBA1c results: 5.4 (09/16/2007)  5.2 (05/18/2006)  Problem # 4:  HYPERTENSION (ICD-401.9) no change  LABS form [place] dated 03-13-2008 creat 0.9  TC 117, TG91, LDL 61, HDL  38  His updated medication list for this problem includes:    Hydrochlorothiazide 25 Mg Tabs (Hydrochlorothiazide) .Marland Kitchen... 1 by mouth qd    Atenolol 50 Mg Tabs (Atenolol) .Marland Kitchen... 1 by mouth qd    Benicar 20 Mg Tabs (Olmesartan medoxomil) .Marland Kitchen... 1 by mouth once daily    Amlodipine Besylate 5 Mg Tabs (Amlodipine besylate) .Marland Kitchen... 1 by mouth once daily  BP today: 130/62 Prior BP: 142/60 (12/19/2007)  Labs Reviewed: Creat: 0.8 (09/16/2007) Chol: 87 (09/16/2007)   HDL: 30.6 (09/16/2007)   LDL: 40 (09/16/2007)   TG: 80 (09/16/2007)  Problem # 5:  ANXIETY (ICD-300.00) before he left, he stated he is ubder stress, son getting divorce patient was tearful, not suicidal he was counseled will consider meds in the future but he does not seem to need them at present   Problem # 6:  UCX sent, to see urology soon  Complete Medication List: 1)  Hydrochlorothiazide 25 Mg Tabs (Hydrochlorothiazide) .Marland Kitchen.. 1 by mouth qd 2)  Atenolol 50 Mg Tabs (Atenolol) .Marland Kitchen.. 1 by mouth qd 3)  Prilosec 20 Mg Cpdr (Omeprazole) .Marland Kitchen.. 1 by mouth bid 4)  Benicar 20 Mg Tabs (Olmesartan medoxomil) .Marland Kitchen.. 1 by mouth once daily 5)  Flomax 0.4 Mg Cp24 (Tamsulosin hcl) .Marland Kitchen.. 1 by mouth once daily 6)  Calcium  7)  Mulit-vitamin  8)  Bayer Low Strength 81 Mg Tbec (Aspirin) 9)  Amlodipine Besylate 5 Mg Tabs (Amlodipine besylate) .Marland Kitchen.. 1 by mouth once daily 10)  Simvastatin 20 Mg Tabs (Simvastatin) .... 1/2  by mouth at bedtime  Other Orders: UA Dipstick w/o Micro (manual) (74259) T-Culture, Urine (56387-56433) T-Urine Microscopic (29518-84166)  Patient Instructions: 1)  Please schedule a follow-up appointment in 3 months .  2)  The medication list was reviewed and reconciled.  All changed / newly prescribed medications were explained.  A complete medication list was provided to the patient / caregiver. Prescriptions: SIMVASTATIN 20 MG  TABS (SIMVASTATIN) 1/2  by mouth at bedtime  #45 x 1   Entered by:   Shary Decamp   Authorized by:    Nolon Rod. Paz MD   Signed by:   Shary Decamp on 03/26/2008   Method used:   Electronically to        [id number] (retail)       [address]       [place], [place]  [address]  Ph: [phone] or [phone]       Fax: [phone]   RxID:   [id number] PRILOSEC 20 MG  CPDR (OMEPRAZOLE) 1 by mouth BID  #180 Capsule x 1   Entered by:   Shary Decamp   Authorized by:   Nolon Rod. Paz MD   Signed by:   Shary Decamp on 03/26/2008   Method used:   Electronically to        [id number]* (retail)       [address]       [place], [place]  [address]       Ph: [phone] or [phone]       Fax: [phone]   RxID:   [id number] ATENOLOL 50 MG TABS (ATENOLOL) 1 by mouth qd  #90 Tablet x 1   Entered by:   Shary Decamp   Authorized by:   Nolon Rod. Paz MD   Signed by:   Shary Decamp on 03/26/2008   Method used:   Electronically to        [id number] (retail)       [address]       [place], [place]  [address]       Ph: [phone] or [phone]       Fax: [phone]   RxID:   [id number] HYDROCHLOROTHIAZIDE 25 MG TABS (HYDROCHLOROTHIAZIDE) 1 by mouth qd  #90 Tablet x 1   Entered by:   Shary Decamp   Authorized by:   Nolon Rod. Paz MD   Signed by:   Shary Decamp on 03/26/2008   Method used:   Electronically to        [id number]* (retail)       [address]       [place], [place]  [address]       Ph: [phone] or [phone]       Fax: [phone]   RxID:   [id number]   Laboratory Results   Urine Tests    Routine Urinalysis   Glucose: negative   (Normal Range: Negative) Bilirubin: large   (Normal Range: Negative) Ketone: moderate (40)   (Normal Range: Negative) Spec. Gravity: 1.015   (Normal Range: 1.003-1.035) Blood: large   (Normal Range: Negative) pH: 6.5   (Normal  Range: 5.0-8.0) Protein: negative   (Normal Range: Negative) Urobilinogen: 0.2   (Normal Range: 0-1) Nitrite: negative   (Normal Range: Negative) Leukocyte Esterace: moderate   (Normal Range: Negative)

## 2010-02-06 NOTE — Progress Notes (Signed)
Summary: TRIAGE   Phone Note Call from Patient Call back at Home Phone [phone] Call back at C# [phone]   Caller: Patient Call For: KAPLAN  Reason for Call: Talk to Nurse Details for Reason: TRIAGE Summary of Call: excruating abd pn  Initial call taken by: Guadlupe Spanish [facility],  April 12, 2008 9:19 AM  Follow-up for Phone Call        Mesage left for patient to callback. Laureen Ochs LPN  April 13, 5282 10:47 AM  Pt. arrived in the lobby. C/O epigastric pain for 5 days, with fever,nausea  day 1-2.  Pt. went to urgent care 2 days ago and was given a Prev-Pac. He has only taken 1 dose. Continues w/epigastric pain and nausea. Pt. will see Dr.Kaplan today at 1:30pm. Follow-up by: Laureen Ochs LPN,  April 12, 2008 11:47 AM

## 2010-02-06 NOTE — Miscellaneous (Signed)
Summary: labs from md in [place]   Clinical Lists Changes  Observations: Added new observation of PLATELETS: 168 10*3/mm3 (03/14/2008 17:04) Added new observation of ESR: 27 mm/hr (03/14/2008 17:04) Added new observation of TRIGLYC TOT: 91 mg/dL (19/14/7829 56:21) Added new observation of LDL: 61 mg/dL (30/86/5784 69:62) Added new observation of HDL: 38 mg/dL (95/28/4132 44:01) Added new observation of CHOLESTEROL: 117 mg/dL (02/72/5366 44:03) Added new observation of BILI TOTAL: 1.3 mg/dL (47/42/5956 38:75) Added new observation of ALK PHOS: 53 units/L (03/14/2008 17:04) Added new observation of SGPT (ALT): 55 units/L (03/14/2008 17:04) Added new observation of SGOT (AST): 35 units/L (03/14/2008 17:04) Added new observation of CALCIUM: 9.0 mg/dL (64/33/2951 88:41) Added new observation of HGBA1C: 5.7 % (03/14/2008 17:04) Added new observation of GLUCOSE SER: 120 mg/dL (66/06/3014 01:09) Added new observation of CREATININE: 0.9 mg/dL (32/35/5732 20:25) Added new observation of BUN: 17 mg/dL (42/70/6237 62:83) Added new observation of POTASSIUM: 3.4 mmol/L (03/14/2008 17:04) Added new observation of HGB: 13.5 g/dL (15/17/6160 73:71) Added new observation of WBC: 5.33 10*3/mm3 (03/14/2008 17:04)        Lab Entry Test Date:                        Value        Units        H/L   Reference  Amylase (Ser):        71           U/L                (16-108)   Lipase (ser):         44           U/L                (7-60)   Vit D. 25-OH:         32.3         ng/ml              (16-74)

## 2010-02-06 NOTE — Letter (Signed)
    Dear Dr. Despina Hick,  Mr. Aizik Reh is doing very well, he denies shortness of breath, chest pain, edema. The EKG done few months ago was normal. On exam his blood pressure is142/70, lungs are clear, cardiovascular exam  is negative. He is cleared to proceed with his surgery in September.  If you have any questions please don't hesitate to call me. Sincerely,     Willow Ora M.D 08-24-06

## 2010-02-06 NOTE — Assessment & Plan Note (Signed)
Summary: cough,congestion.cbs   Vital Signs:  Patient profile:   77 year old male Weight:      130.6 pounds Temp:     97.5 degrees F oral Pulse rate:   64 / minute Resp:     17 per minute BP sitting:   136 / 66  (right arm) Cuff size:   regular  Vitals Entered By: Shonna Chock (April 26, 2008 1:51 PM) CC: ALLERGIES: ITCHY WATERY EYES/NOSE, DRY COUGH, SNEEZING,SORE THROAT X 1 WEEK Comments PATIENT TRIED LORATIDINE X 1 WEEK-NO RELIEF   Primary Care Provider:  Willow Ora, MD  CC:  ALLERGIES: ITCHY WATERY EYES/NOSE, DRY COUGH, SNEEZING, and SORE THROAT X 1 WEEK.  History of Present Illness: Onset as sneezing 1 week ago ; minimal response to Loratidine. Watery itchy eyes, congestion  also.  Allergies (verified): No Known Drug Allergies  Review of Systems General:  Denies chills, fever, and sweats. Eyes:  Denies discharge, eye pain, and vision loss-both eyes. ENT:  No frontal headache or facial pain or purulence. Resp:  Complains of cough; denies shortness of breath, sputum productive, and wheezing. Allergy:  Complains of itching eyes, seasonal allergies, and sneezing; denies hives or rash; No angioedema.  Physical Exam  General:  in no acute distress; alert,appropriate and cooperative throughout examination Eyes:  No corneal or conjunctival inflammation noted. EOMI. Perrla. Arcus  Vision grossly normal. Nose:  External nasal examination shows no deformity or inflammation. Nasal mucosa are dry without lesions or exudates. Mouth:  Oral mucosa and oropharynx without lesions or exudates.  S/P uvulectomy Lungs:  Normal respiratory effort, chest expands symmetrically. Lungs are clear to auscultation, no crackles or wheezes. Cervical Nodes:  No lymphadenopathy noted Axillary Nodes:  No palpable lymphadenopathy   Impression & Recommendations:  Problem # 1:  ALLERGIC RHINITIS (ICD-477.9)  His updated medication list for this problem includes:    Fexofenadine Hcl 180 Mg Tabs  (Fexofenadine hcl) .Marland Kitchen... 1 once daily    Nasonex 50 Mcg/act Susp (Mometasone furoate) .Marland Kitchen... 1 spray two times a day as needed  Problem # 2:  DIABETES MELLITUS, TYPE II (ICD-250.00) A1c @ goal by history His updated medication list for this problem includes:    Benicar 20 Mg Tabs (Olmesartan medoxomil) .Marland Kitchen... 1 by mouth once daily    Bayer Low Strength 81 Mg Tbec (Aspirin)  Complete Medication List: 1)  Hydrochlorothiazide 25 Mg Tabs (Hydrochlorothiazide) .Marland Kitchen.. 1 by mouth qd 2)  Atenolol 50 Mg Tabs (Atenolol) .Marland Kitchen.. 1 by mouth qd 3)  Prilosec 20 Mg Cpdr (Omeprazole) .Marland Kitchen.. 1 by mouth bid 4)  Benicar 20 Mg Tabs (Olmesartan medoxomil) .Marland Kitchen.. 1 by mouth once daily 5)  Flomax 0.4 Mg Cp24 (Tamsulosin hcl) .Marland Kitchen.. 1 by mouth once daily 6)  Calcium  7)  Mulit-vitamin  8)  Bayer Low Strength 81 Mg Tbec (Aspirin) 9)  Amlodipine Besylate 5 Mg Tabs (Amlodipine besylate) .Marland Kitchen.. 1 by mouth once daily 10)  Simvastatin 20 Mg Tabs (Simvastatin) .... 1/2  by mouth at bedtime 11)  Fish Oil 1000 Mg Caps (Omega-3 fatty acids) .... Once daily 12)  Gnp Flax Seed Oil 1000 Mg Caps (Flaxseed (linseed)) .... Once daily 13)  Calcium 600/vitamin D 600-400 Mg-unit Tabs (Calcium carbonate-vitamin d) .... Once daily 14)  Centrum Silver Tabs (Multiple vitamins-minerals) .... Once daily 15)  Prevpac Misc (Amoxicill-clarithro-lansopraz) .... Take as directed 16)  Fexofenadine Hcl 180 Mg Tabs (Fexofenadine hcl) .Marland Kitchen.. 1 once daily 17)  Singulair 10 Mg Tabs (Montelukast sodium) .Marland Kitchen.. 1 once daily 18)  Nasonex 50 Mcg/act Susp (Mometasone furoate) .Marland Kitchen.. 1 spray two times a day as needed  Patient Instructions: 1)  Neti pot once daily for congestion.Meds as directed ; stop Loratidine. Prescriptions: NASONEX 50 MCG/ACT SUSP (MOMETASONE FUROATE) 1 spray two times a day as needed  #1 x 5   Entered and Authorized by:   Marga Melnick MD   Signed by:   Marga Melnick MD on 04/26/2008   Method used:   Print then Give to Patient   RxID:    [id number] SINGULAIR 10 MG TABS (MONTELUKAST SODIUM) 1 once daily  #14 x 0   Entered and Authorized by:   Marga Melnick MD   Signed by:   Marga Melnick MD on 04/26/2008   Method used:   Print then Give to Patient   RxID:   [id number] FEXOFENADINE HCL 180 MG TABS (FEXOFENADINE HCL) 1 once daily  #30 x 5   Entered and Authorized by:   Marga Melnick MD   Signed by:   Marga Melnick MD on 04/26/2008   Method used:   Print then Give to Patient   RxID:   [id number]

## 2010-02-06 NOTE — Procedures (Signed)
Summary: ERCP   ERCP  Procedure date:  04/13/2008  Findings:      Location: [facility]   ERCP PROCEDURE REPORT  PATIENT:  [name], [name]  MR#:  [id number] BIRTHDATE:   1926/01/29   GENDER:   male  ENDOSCOPIST:   Barbette Hair. Arlyce Dice, MD ASSISTANT:    PROCEDURE DATE:  04/13/2008 PROCEDURE:  ERCP with removal of stones  INDICATIONS: suspected stone Abdominal pain, abnormal LFTs including jaundice  MEDICATIONS:    Fentanyl 50 mcg, Versed 6 mg, glycopyrrolate (Robinal) 0.2 mg, glucagon 1 mg IV, cipro 400mg  IV TOPICAL ANESTHETIC:   Cetacaine Spray  DESCRIPTION OF PROCEDURE:   After the risks benefits and alternatives of the procedure were thoroughly explained, informed consent was obtained.  The [id number] ([id number]) endoscope was introduced through the mouth and advanced to the second portion of the duodenum.  A stone was found in the common bile duct. At least 1 2-59mm stone fragment distal portion of CBD. Multiple stone fragments were seen in the cystic duct.  Pancreatic duct was not injected Sphincterotomy was performed with a regular 20 mm pappillotome using guidewire technique. 10mm sphincterotomy was made. Single stone fragment was seen in the duodenum (see image001). Final Cholangiogram was normal    The scope was then completely withdrawn from the patient and the procedure terminated. <<PROCEDUREIMAGES>><<OLD IMAGES>>  COMPLICATIONS:   None  ENDOSCOPIC IMPRESSION:  1) Stone in the common bile duct  cc Dr. Willow Ora RECOMMENDATIONS:  1) follow-up: office 3 week(s)  Repeat LFTs in 3 days    _______________________________ Barbette Hair. Arlyce Dice, MD   CC:     This report was created from the original endoscopy report, which was reviewed and signed by the above listed endoscopist.     Appended Document: ERCP Pt. for LFT's today. For repeat LFT's 05-07-08 anf REV w/Dr.Kaplan 05-09-08. Pt. instructed to call back as needed.

## 2010-02-06 NOTE — Progress Notes (Signed)
Summary: paz--refill #90  Phone Note Refill Request   Refills Requested: Medication #1:  AMLODIPINE BESYLATE 5 MG  TABS 1 by mouth once daily [facility] on [address] [phone]  Initial call taken by: Freddy Jaksch,  June 10, 2007 2:30 PM      Prescriptions: AMLODIPINE BESYLATE 5 MG  TABS (AMLODIPINE BESYLATE) 1 by mouth once daily  #30 x 5   Entered by:   Shary Decamp   Authorized by:   Nolon Rod. Paz MD   Signed by:   Shary Decamp on 06/10/2007   Method used:   Electronically sent to ...       [facility]  [address] # [id number]*       [address]       [place], [place]  [address]       Ph: [phone] or [phone]       Fax: [phone]   RxID:   [id number]

## 2010-02-06 NOTE — Progress Notes (Signed)
Summary: Benicar PA   Phone Note Call from Patient Call back at Work Phone [phone]   Caller: Patient Summary of Call: Patient called this morning and is in [place] visting his daughter. He is out of Benicar and he contacted his pharmacy and was informed that his insurance will no cover this medicine anymore. We need to call [phone] [facility]).  Initial call taken by: Harold Barban,  January 20, 2010 10:26 AM  Follow-up for Phone Call        Forms requested. Lucious Groves CMA  January 20, 2010 10:51 AM   Forms completed and faxed, will await insurance company reply. Lucious Groves CMA  January 20, 2010 11:41 AM

## 2010-02-06 NOTE — Progress Notes (Signed)
Summary: NEEDS 90 DAY PRESCRIPTIONS FOR ALL HIS MEDICATIONS  Phone Note Call from Patient Call back at Home Phone [phone] Call back at Work Phone [phone]   Caller: Patient Summary of Call: PATIENT LEFT FROM OFFICE VISIT AND CAME BACK IN TO REQUEST A "90 DAY PRESCRIPTION PLUS REFILLS"  OF ALL OF HIS MEDICATIONS LISTED ON HIS CURRENT MEDS LIST THAT ARE NOT OVER THE COUNTER PRESCRIPTIONS---  I KEPT THE PAPER COPY OF THE TWO PRESCRIPTIONIS (HYDROCHLOROTHIAZIDE AND ATENOLOL) SO THAT ALL PRESCRIPTIONS COULD BE CALLED IN TOGETHER TO:  [facility] [address] IN [place]  PLEASE CALL HIM TO CONFIRM THAT ALL PRESCRIPTIONS HAVE BEEN CALLED IN Initial call taken by: Jerolyn Shin,  February 19, 2009 12:57 PM    Prescriptions: SIMVASTATIN 20 MG  TABS (SIMVASTATIN) 1/2  by mouth at bedtime  #45 x 0   Entered by:   Shary Decamp   Authorized by:   Nolon Rod. Paz MD   Signed by:   Shary Decamp on 02/20/2009   Method used:   Electronically to        [facility] # [id number]* (retail)       [address]       [place], [place]  [address]       Ph: [phone] or [phone]       Fax: [phone]   RxID:   [id number] AMLODIPINE BESYLATE 5 MG  TABS (AMLODIPINE BESYLATE) 1 by mouth once daily - OFFICE VISIT FOR ADDITIONAL REFILLS  #90 x 0   Entered by:   Shary Decamp   Authorized by:   Nolon Rod. Paz MD   Signed by:   Shary Decamp on 02/20/2009   Method used:   Electronically to        [facility] # [id number]* (retail)       [address]       [place], [place]  [address]       Ph: [phone] or [phone]       Fax: [phone]   RxID:   [id number] BENICAR 20 MG  TABS (OLMESARTAN MEDOXOMIL) 1 by mouth once daily - OFFICE VISIT FOR ADDITIONAL REFILLS  #90 x 0   Entered by:   Shary Decamp   Authorized by:   Nolon Rod. Paz MD   Signed by:   Shary Decamp on 02/20/2009   Method used:   Electronically to        [facility]. # [id number]* (retail)       [address]       [place], [place]  [address]       Ph: [phone] or [phone]       Fax: [phone]   RxID:   [id number] PRILOSEC 20 MG  CPDR (OMEPRAZOLE) 1 by mouth BID  #180 x 0   Entered by:   Shary Decamp   Authorized by:   Nolon Rod. Paz MD   Signed by:   Shary Decamp on 02/20/2009   Method used:   Electronically to        [facility] # [id number]* (retail)       [address]       [place], [place]  [address]       Ph: [phone] or [phone]  Fax: [phone]   RxID:   [id number] ATENOLOL 50 MG TABS (ATENOLOL) 1/2 by mouth once daily  #90 x 0   Entered by:   Shary Decamp   Authorized by:   Nolon Rod. Paz MD   Signed by:   Shary Decamp on 02/20/2009   Method used:   Electronically to        [facility] # [id number]* (retail)       [address]       [place], [place]  [address]       Ph: [phone] or [phone]       Fax: [phone]   RxID:   [id number] HYDROCHLOROTHIAZIDE 25 MG TABS (HYDROCHLOROTHIAZIDE) 1/2 by mouth once daily  #90 x 0   Entered by:   Shary Decamp   Authorized by:   Nolon Rod. Paz MD   Signed by:   Shary Decamp on 02/20/2009   Method used:   Electronically to        [facility] # [id number]* (retail)       [address]       [place], [place]  [address]       Ph: [phone] or [phone]       Fax: [phone]   RxID:   [id number]

## 2010-02-06 NOTE — Progress Notes (Signed)
  Phone Note From Pharmacy   Caller: [facility]  [address] # [id number]* Summary of Call: PHARMACIST CALLED TO SAY PT IS GOING OUT OF TOWN FOR MORE THAN 3 WEEKS AND NEEDS 0 DAYS OF RX INFORMED PHARMACIST PT IS DUE FOR LABS FROM 9/09 WILL FAX 90 DAYS AND PHARMACIST WILL INFORM PT Initial call taken by: Kandice Hams,  January 18, 2008 4:07 PM      Prescriptions: ATENOLOL 50 MG TABS (ATENOLOL) 1 by mouth qd  #90 Tablet x 0   Entered by:   Kandice Hams   Authorized by:   Nolon Rod. Paz MD   Signed by:   Kandice Hams on 01/18/2008   Method used:   Faxed to ...       [facility]  [address] [id number]* (retail)       [address]       [place], [place]  [address]       Ph: [phone] or [phone]       Fax: [phone]   RxID:   [id number] HYDROCHLOROTHIAZIDE 25 MG TABS (HYDROCHLOROTHIAZIDE) 1 by mouth qd  #90 x 0   Entered by:   Kandice Hams   Authorized by:   Nolon Rod. Paz MD   Signed by:   Kandice Hams on 01/18/2008   Method used:   Faxed to ...       [facility]  [address] [id number]* (retail)       [address]       [place], [place]  [address]       Ph: [phone] or [phone]       Fax: [phone]   RxID:   [id number]

## 2010-02-06 NOTE — Progress Notes (Signed)
Summary: Benicar denied  Phone Note Call from Patient Call back at [phone] or [phone]   Summary of Call: Patient called to check on status of prior authorization due to about to run out of medicine.   I have not received denial or approval letter, so I called into automated system. RX has been denied and did not recommend alternatives. Please advise. Initial call taken by: Lucious Groves CMA,  January 28, 2010 12:05 PM  Follow-up for Phone Call        he could pay out of pocket if so desired.  If not we could switch him to losartan 50 mg one p.o. today, that is a generic and hopefully he won't have any problems. Followup in 2 months, fasting, physical exam Follow-up by: Jose E. Paz MD,  January 29, 2010 10:54 AM  Additional Follow-up for Phone Call Additional follow up Details #1::        Patient notified and states that he will finish the Benicar he has  (1 month) and then will come back to see Paz. Patient denied Losartan prescription. Patient is aware he needs CPX. Additional Follow-up by: Lucious Groves CMA,  January 29, 2010 11:40 AM

## 2010-02-06 NOTE — Assessment & Plan Note (Signed)
Summary: knee surgery f/u/tl   Vital Signs:  Patient Profile:   77 Years Old Male Weight:      131 pounds Temp:     98.1 degrees F oral Pulse rate:   80 / minute BP sitting:   120 / 60  Vitals Entered By: Shary Decamp (October 04, 2006 11:18 AM)                 Chief Complaint:  rov; rf meds.  History of Present Illness: ROV  left knee replaced two weeks ago feeling well  Current Allergies (reviewed today): No known allergies  Updated/Current Medications (including changes made in today's visit):  HYDROCHLOROTHIAZIDE 25 MG TABS (HYDROCHLOROTHIAZIDE) 1 by mouth qd ATENOLOL 50 MG TABS (ATENOLOL) 1 by mouth qd PRILOSEC 20 MG  CPDR (OMEPRAZOLE) 1 by mouth BID BENICAR 20 MG  TABS (OLMESARTAN MEDOXOMIL) 1 by mouth once daily FLOMAX 0.4 MG  CP24 (TAMSULOSIN HCL) 1 by mouth once daily * CALCIUM  * MULIT-VITAMIN  ROBAXIN 500 MG  TABS (METHOCARBAMOL) one p.o. q.i.d. p.r.n. pain PERCOCET 5-325 MG  TABS (OXYCODONE-ACETAMINOPHEN) 1 or 2 p.o. q.i.d. p.r.n.   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 Occ. has backache for which his orthopedic doctor has prescribed Robaxin.  Robaxin does help   Physical Exam  General:     alert and well-developed.   Lungs:     normal respiratory effort, no intercostal retractions, and no accessory muscle use.   Heart:     normal rate, regular rhythm, and no murmur.   Extremities:     no edema surgical scar healing well    Impression & Recommendations:  Problem # 1:  OSTEOARTHRITIS (ICD-715.90) Assessment: Improved recovering well from surgery His updated medication list for this problem includes:    Percocet 5-325 Mg Tabs (Oxycodone-acetaminophen) .Marland Kitchen... 1 or 2 p.o. q.i.d. p.r.n.   Problem # 2:  HYPERTENSION (ICD-401.9) check BMP His updated medication list for this problem includes:  Hydrochlorothiazide 25 Mg Tabs (Hydrochlorothiazide) .Marland Kitchen... 1 by mouth qd    Atenolol 50 Mg Tabs (Atenolol) .Marland Kitchen... 1 by mouth qd    Benicar 20 Mg Tabs (Olmesartan medoxomil) .Marland Kitchen... 1 by mouth once daily  BP today: 120/60 Prior BP: 142/70 (08/24/2006)  Labs Reviewed: Creat: 0.7 (05/18/2006) Chol: 176 (06/28/2006)   HDL: 39.3 (06/28/2006)   LDL: 100 (06/28/2006)   TG: 185 (06/28/2006)  Orders: TLB-BMP (Basic Metabolic Panel-BMET) (80048-METABOL) Venipuncture ([address])   Problem # 3:  ANEMIA NOS (ICD-285.9) history of anemia, check hemoglobin Orders: TLB-Hemoglobin (Hgb) (85018-HGB) Venipuncture ([address])   Complete Medication List: 1)  Hydrochlorothiazide 25 Mg Tabs (Hydrochlorothiazide) .Marland Kitchen.. 1 by mouth qd 2)  Atenolol 50 Mg Tabs (Atenolol) .Marland Kitchen.. 1 by mouth qd 3)  Prilosec 20 Mg Cpdr (Omeprazole) .Marland Kitchen.. 1 by mouth bid 4)  Benicar 20 Mg Tabs (Olmesartan medoxomil) .Marland Kitchen.. 1 by mouth once daily 5)  Flomax 0.4 Mg Cp24 (Tamsulosin hcl) .Marland Kitchen.. 1 by mouth once daily 6)  Calcium  7)  Mulit-vitamin  8)  Robaxin 500 Mg Tabs (Methocarbamol) .... One p.o. q.i.d. p.r.n. pain 9)  Percocet 5-325 Mg Tabs (Oxycodone-acetaminophen) .Marland Kitchen.. 1 or 2 p.o. q.i.d. p.r.n.   Patient Instructions: 1)  Please schedule a follow-up appointment in 6 months. 2)  don't forget your flu shot this fall    Prescriptions: BENICAR  20 MG  TABS (OLMESARTAN MEDOXOMIL) 1 by mouth once daily  #90 x 3   Entered and Authorized by:   Elita Quick E. Paz MD   Signed by:   Nolon Rod. Paz MD on 10/04/2006   Method used:   Print then Give to Patient   RxID:   [id number] ROBAXIN 500 MG  TABS (METHOCARBAMOL) one p.o. q.i.d. p.r.n. pain  #60 x 0   Entered and Authorized by:   Nolon Rod. Paz MD   Signed by:   Nolon Rod. Paz MD on 10/04/2006   Method used:   Print then Give to Patient   RxID:   [id number]  ]

## 2010-02-06 NOTE — Progress Notes (Signed)
Summary: lab results  Phone Note Outgoing Call   Details for Reason: LAB RESULTS: advise patient  his cholesterol is a lot better.  He  can decrease simvastatin to half tablet daily. diabetes well control rest of the labs normal  Signed by [facility] E. Paz MD on 09/22/2007 at 1:28 PM  vitamin D normal Signed by Nolon Rod. Paz MD on 09/22/2007 at 1:28 PM Summary of Call: DISCUSSED WITH PT COPY UP FRONT FOR PT TO PICK UP ...........Marland KitchenShary Decamp  September 22, 2007 1:32 PM

## 2010-02-06 NOTE — Assessment & Plan Note (Signed)
Summary: PT SAID HE IS A LITTLE DIZZINESS/PH   Vital Signs:  Patient Profile:   77 Years Old Male Weight:      131.8 pounds Pulse rate:   82 / minute BP sitting:   138 / 62  Vitals Entered By: Shary Decamp (November 04, 2006 11:20 AM)                 Chief Complaint:  10/26 had an episode of "eyes feeling tight, lightheaded, dizzy, blurred vision, neck pain" lasted for 2-3 minutes; since then has felt lightheaded off and on, and +HA; check incision on knee.  History of Present Illness: 10/26 had an episode of "eyes feeling tight, lightheaded, dizzy, blurred vision, neck pain" lasted for 2-3 minutes;  as above since then his usual lightheaded episodes are not more intense but more frequent. Often times  sx are  associated with standing up  Current Allergies (reviewed today): No known allergies      Review of Systems       denies slurred speech, diplopia, focal weaknesses.  No nausea diarrhea or blood in the stools. Headaches?  He denies headaches to me, at the time of the episode he did have some neck pain.   Physical Exam  General:     alert and well-developed.   Head:     normocephalic and atraumatic.   Neck:     no masses and normal carotid upstroke.   Lungs:     normal respiratory effort, no intercostal retractions, and no accessory muscle use.   Heart:     normal rate, regular rhythm, and no murmur.   Extremities:     no edema surgical incision looks well Neurologic:     alert & oriented X3, cranial nerves II-XII intact, strength normal in all extremities, and Romberg negative.      Impression & Recommendations:  Problem # 1:  DIZZINESS (ICD-780.4) episode of dizziness, resolved. Neuro exam is negative Did have  neck pain consequently we'll do a CT of the head Hg:14.4 Orders: Hgb ([address]) Radiology Referral (Radiology)   Complete Medication List: 1)  Hydrochlorothiazide 25 Mg Tabs (Hydrochlorothiazide) .Marland Kitchen.. 1 by mouth qd 2)  Atenolol 50 Mg  Tabs (Atenolol) .Marland Kitchen.. 1 by mouth qd 3)  Prilosec 20 Mg Cpdr (Omeprazole) .Marland Kitchen.. 1 by mouth bid 4)  Benicar 20 Mg Tabs (Olmesartan medoxomil) .Marland Kitchen.. 1 by mouth once daily 5)  Flomax 0.4 Mg Cp24 (Tamsulosin hcl) .Marland Kitchen.. 1 by mouth once daily 6)  Calcium  7)  Mulit-vitamin  8)  Bayer Low Strength 81 Mg Tbec (Aspirin)   Patient Instructions: 1)  rest 2)  fluids 3)  CT of the head 4)  if symptoms worsen or came back: let me know    ]  Laboratory Results  CBC HGB:  14.4 g/dL   (Normal Range: 60.4-54.0 in Males, 12.0-15.0 in Females)

## 2010-02-06 NOTE — Assessment & Plan Note (Signed)
Summary: CPX    PH  Medications Added HYDROCHLOROTHIAZIDE 25 MG TABS (HYDROCHLOROTHIAZIDE)  ATENOLOL 50 MG TABS (ATENOLOL)  PRILOSEC 20 MG  CPDR (OMEPRAZOLE) 1 by mouth BID BENICAR 20 MG  TABS (OLMESARTAN MEDOXOMIL) 1 by mouth once daily FLOMAX 0.4 MG  CP24 (TAMSULOSIN HCL) 1 by mouth once daily * FE  * CALCIUM  * MULIT-VITAMIN         Vital Signs:  Patient Profile:   77 Years Old Male Weight:      124 pounds Pulse rate:   64 / minute Pulse rhythm:   regular BP sitting:   142 / 70  (left arm) Cuff size:   regular  Vitals Entered By: [name] (June 28, 2006 8:35 AM)               Chief Complaint:  YEARLY;  - C/O WT LOSS LAST WT 5/13  WAS 129#; PT IS HUNGRY AND IS EATING WELL.  History of Present Illness: yearly checkup. Feels very well. Patient is anxious because he is losing weight despite very good appetite and p.o. intake     Social History:    Retired    Married    has two children, one in [place] and one in [place]   Risk Factors:  Tobacco use:  never Alcohol use:  yes    Comments:  very rarely   Review of Systems  The patient denies anorexia and fever.    CV      Denies chest pain or discomfort.  Resp      Denies shortness of breath.  GI      Denies constipation, nausea, vomiting, and vomiting blood.      the patient started to take iron 4 weeks ago and since then  his stools are dark denies dysphasia or odynophagia  GU      Denies dysuria and hematuria.  Psych      no overt anxiety or depression   Physical Exam  General:     alert and well-developed.   Neck:     no masses and no thyromegaly.   Lungs:     normal respiratory effort, no intercostal retractions, no accessory muscle use, and normal breath sounds.   Heart:     normal rate, regular rhythm, no murmur, and no gallop.   Abdomen:     soft, non-tender, no hepatomegaly, and no splenomegaly.   Extremities:     no edema Psych:     Oriented X3, memory intact for  recent and remote, not anxious appearing, and not depressed appearing.      Impression & Recommendations:  Problem # 1:  HYPERTENSION (ICD-401.9) blood pressure well controlled His updated medication list for this problem includes:    Hydrochlorothiazide 25 Mg Tabs (Hydrochlorothiazide)    Atenolol 50 Mg Tabs (Atenolol)    Benicar 20 Mg Tabs (Olmesartan medoxomil) .Marland Kitchen... 1 by mouth once daily  BP today: 142/70  Labs Reviewed: Creat: 0.7 (05/18/2006)  Orders: TLB-Hepatic/Liver Function Pnl (80076-HEPATIC) TLB-Lipid Panel (80061-LIPID) TLB-TSH (Thyroid Stimulating Hormone) (84443-TSH) Venipuncture (91478)   Problem # 2:  GERD (ICD-530.81) symptoms well-controlled with Prilosec His updated medication list for this problem includes:    Prilosec 20 Mg Cpdr (Omeprazole) .Marland Kitchen... 1 by mouth bid   Orders: TLB-Hepatic/Liver Function Pnl (80076-HEPATIC) TLB-Lipid Panel (80061-LIPID) TLB-TSH (Thyroid Stimulating Hormone) (84443-TSH)   Problem # 3:  COLONIC POLYPS (ICD-211.3) over due for colonoscopy, the patient put it off due to his knee surgery. Refer to  Dr. Arlyce [name]. Orders: Gastroenterology Referral (GI) TLB-Hepatic/Liver Function Pnl (80076-HEPATIC) TLB-Lipid Panel (80061-LIPID) TLB-TSH (Thyroid Stimulating Hormone) (84443-TSH) TLB-CBC Platelet - w/Differential (85025-CBCD)   Problem # 4:  primary care issues chart review. Follow-up with urology every 6 months for a history of BPH. Had a normal bone density test in October 2007. At some point he was glucose intolerance but his hemoglobin A1c was 5.2 last month. GI referral. information about shingles shot discussed with the patient  Problem # 5:  WEIGHT LOSS, RECENT [phone]) the patient feels well and looks healthy. on chart review a year ago he weights 131 pounds, a month ago he weighed 129 and today is 124 Check TSH.  Also check CBC and LFTs. pertinent chart review shows that the creatinine was normal last month.  Also in April,he had a preop chest x-ray that was abnormal, this was follow-up by a CT of the chest which show no acute changes , only a calcified granuloma Orders: TLB-Hepatic/Liver Function Pnl (80076-HEPATIC) TLB-Lipid Panel (80061-LIPID) TLB-TSH (Thyroid Stimulating Hormone) (84443-TSH) TLB-CBC Platelet - w/Differential (85025-CBCD) Venipuncture (19147)   Medications Added to Medication List This Visit: 1)  Hydrochlorothiazide 25 Mg Tabs (Hydrochlorothiazide) 2)  Atenolol 50 Mg Tabs (Atenolol) 3)  Prilosec 20 Mg Cpdr (Omeprazole) .Marland Kitchen.. 1 by mouth bid 4)  Benicar 20 Mg Tabs (Olmesartan medoxomil) .Marland Kitchen.. 1 by mouth once daily 5)  Flomax 0.4 Mg Cp24 (Tamsulosin hcl) .Marland Kitchen.. 1 by mouth once daily 6)  Fe  7)  Calcium  8)  Mulit-vitamin    Patient Instructions: 1)  take a daily multivitamin. 2)  we are referring [name] to Dr. Arlyce [name]. If you don't hear from [place] in two weeks please let us know 3)  come back in 3 months

## 2010-02-06 NOTE — Progress Notes (Signed)
Summary: bp readingsDR PAZ SEE PLEASE  Phone Note Call from Patient Call back at Home Phone [phone] Call back at [phone]   Caller: Patient Reason for Call: Talk to Nurse Summary of Call: dr. Drue Novel patients blood pressure went down and wanted to speak with the nurse......about the readings. dr. Drue Novel recommend that he stay on his medication but he wanted to know what he should do about his blood pressure going down.  Initial call taken by: Charolette Child,  Jun 02, 2007 12:11 PM  Follow-up for Phone Call        Spoke with pt who was in this am 06/02/07 went to pharmacy and checked his bp was 111/62, pt feel like his bp is droppingwas 142/76 at ov, he has no dizziness.Distolic has dropped as low as 50 pt said he is on atenolol 50mg  amlodipine 5 mg, hctz 25 mg, and benicar 20 mg,  He says he did not take his amlodipine today  please advise pt informed Dr Drue Novel out of office this pm will wait for call tomorrow .Kandice Hams  Jun 02, 2007 3:12 PM  Follow-up by: Kandice Hams,  Jun 02, 2007 3:12 PM  Additional Follow-up for Phone Call Additional follow up Details #1::        patient feels ok -- advised to start medication -- monitor bp -- call if start to feel bad...dizzy, lightheaded.... Shary Decamp  Jun 03, 2007 11:34 AM  PT CALLED DISCUSSED W/ STACIA.Marland KitchenMarland KitchenPER DR PAZ AND STACIA.Marland KitchenREADING OK..TO KEEP TAKING MEDS, PT SAYS HE TRUST HIS PHYSICIAN AND WILL START BACK ON THE MEDS. SAYS HE STOPPED AFTER GOING TO THE LOCAL DRUG STORE AND TAKING HIS BP THERE...SAYS HE BECAME ALARM.  AND CALLED THE OFFICE.   PT SAY HE IS OK NOW. NOT DIZZINESS.Marland KitchenDaine Gip  Jun 03, 2007 12:49 PM  Additional Follow-up by: Daine Gip,  Jun 03, 2007 12:49 PM

## 2010-02-06 NOTE — Letter (Signed)
Summary: Results Follow up Letter  [facility]  [address] [place], [place] [address]   Phone: [phone]  Fax: [phone]    11/28/2007 MRN: [id number]  [name] [address] [place], [place]  [address]  Dear Mr. Moeller,  The following are the results of your recent test(s):  Test         Result    Pap Smear:        Normal _____  Not Normal _____ Comments: ______________________________________________________ Cholesterol: LDL(Bad cholesterol):         Your goal is less than:         HDL (Good cholesterol):       Your goal is more than: Comments:  ______________________________________________________ Mammogram:        Normal _____  Not Normal _____ Comments:  ___________________________________________________________________ Hemoccult:        Normal _____  Not normal _______ Comments:    _____________________________________________________________________ Other Tests:  Your bone density test was normal.   We routinely do not discuss normal results over the telephone.  If you desire a copy of the results, or you have any questions about this information we can discuss them at your next office visit.   Sincerely,

## 2010-02-06 NOTE — Progress Notes (Signed)
Summary: cough - dr Drue Novel  Phone Note Call from Patient Call back at Home Phone [phone]   Caller: Patient Summary of Call: patient has cough, body ache congested  Initial call taken by: Okey Regal [place],  May 23, 2007 9:47 AM  Follow-up for Phone Call        DRY COUGH, BODY ACHES, THROAT IRRITATION DUE TO CONGESTION X 4 DAYS, PATIENT TO BE SEEN @ 4PM TODAY WITH DR.HOPPER./Chrae Malloy  May 23, 2007 9:55 AM

## 2010-02-06 NOTE — Progress Notes (Signed)
Summary: rx  Phone Note Refill Request Message from:  Patient  Refills Requested: Medication #1:  BENICAR 20 MG  TABS 1 by mouth once daily pt scheduled his yeary for 12/25/08 need refill of med until then [facility] [address]   Initial call taken by: Kandice Hams,  November 14, 2008 10:59 AM    Prescriptions: BENICAR 20 MG  TABS (OLMESARTAN MEDOXOMIL) 1 by mouth once daily  #30 Tablet x 1   Entered by:   Kandice Hams   Authorized by:   Nolon Rod. Paz MD   Signed by:   Kandice Hams on 11/14/2008   Method used:   Faxed to ...       [facility]  [address] # [id number]* (retail)       [address]       [place], [place]  [address]       Ph: [phone] or [phone]       Fax: [phone]   RxID:   [id number]

## 2010-02-06 NOTE — Assessment & Plan Note (Signed)
Summary: acute only for abd pain for 3 days//ph   Vital Signs:  Patient Profile:   77 Years Old Male Weight:      138.2 pounds Temp:     98.1 degrees F oral BP sitting:   142 / 60  (left arm) Cuff size:   regular  Vitals Entered By: Shary Decamp (December 19, 2007 10:43 AM)                 Chief Complaint:  RLQ pain off & on x 3-4 days; mild pain 2-3x/day only last for a couple of minutes.  History of Present Illness: 3 day h/o of R suprapubic pain, last 1 minute, one time was exhacerbated  by bending, had few episodes. No radiation, no bulging in that area      Updated Prior Medication List: HYDROCHLOROTHIAZIDE 25 MG TABS (HYDROCHLOROTHIAZIDE) 1 by mouth qd ATENOLOL 50 MG TABS (ATENOLOL) 1 by mouth qd PRILOSEC 20 MG  CPDR (OMEPRAZOLE) 1 by mouth BID BENICAR 20 MG  TABS (OLMESARTAN MEDOXOMIL) 1 by mouth once daily FLOMAX 0.4 MG  CP24 (TAMSULOSIN HCL) 1 by mouth once daily * CALCIUM  * MULIT-VITAMIN  BAYER LOW STRENGTH 81 MG  TBEC (ASPIRIN)  AMLODIPINE BESYLATE 5 MG  TABS (AMLODIPINE BESYLATE) 1 by mouth once daily * VITAMIN D OTC 1 a day SIMVASTATIN 20 MG  TABS (SIMVASTATIN) 1 by mouth at bedtime  Current Allergies (reviewed today): No known allergies   Past Medical History:    Reviewed history from 09/14/2007 and no changes required:       Allergic rhinitis       GERD w/ esoph stricture       DM II       HYPERLIPIDEMIA       Hypertension       Benign prostatic hypertrophy       Osteoarthritis       h/o CVA, per CT "old stroke"       INTERNAL HEMORRHOIDS (ICD-455.0)       h/o  ANEMIA NOS (ICD-285.9)       COLONIC POLYPS (ICD-211.3)       DEXA normal 10-07       NCS 5-07: polyneuropathy, ?diabetic       6-06 (-) cardiolite         Past Surgical History:    Total knee replacement, 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 Inguinal hernia repair (per patient L in the 90s, R in the 80s)     Review of Systems  General      Denies  fever.  GI      Denies abdominal pain, bloody stools, nausea, and vomiting.      some constipation (chronic)   GU      Denies dysuria and hematuria.   Physical Exam  General:     alert and well-developed.   Abdomen:     soft, non-tender, normal bowel sounds, no distention, no masses, no inguinal hernia, no hepatomegaly, and no splenomegaly.   Area of pain (R suprapubic) is palpated, no tenderness or mass Genitalia:     uncircumcised, no hydrocele, no varicocele, no scrotal masses, no testicular masses or atrophy, no cutaneous lesions, and no urethral discharge.      Impression & Recommendations:  Problem # 1:  SUPRAPUBIC PAIN (ICD-789.09) no evidence of hernia, no abdominal pain, Udip (-) ?muscleskeletal pain Rec: observe, Tylenol,warm compress call if worse  His updated medication list  for this problem includes:    Bayer Low Strength 81 Mg Tbec (Aspirin)  Orders: UA Dipstick w/o Micro (manual) ([address])   Complete Medication List: 1)  Hydrochlorothiazide 25 Mg Tabs (Hydrochlorothiazide) .Marland Kitchen.. 1 by mouth qd 2)  Atenolol 50 Mg Tabs (Atenolol) .Marland Kitchen.. 1 by mouth qd 3)  Prilosec 20 Mg Cpdr (Omeprazole) .Marland Kitchen.. 1 by mouth bid 4)  Benicar 20 Mg Tabs (Olmesartan medoxomil) .Marland Kitchen.. 1 by mouth once daily 5)  Flomax 0.4 Mg Cp24 (Tamsulosin hcl) .Marland Kitchen.. 1 by mouth once daily 6)  Calcium  7)  Mulit-vitamin  8)  Bayer Low Strength 81 Mg Tbec (Aspirin) 9)  Amlodipine Besylate 5 Mg Tabs (Amlodipine besylate) .Marland Kitchen.. 1 by mouth once daily 10)  Vitamin D Otc  .Marland Kitchen.. 1 a day 11)  Simvastatin 20 Mg Tabs (Simvastatin) .Marland Kitchen.. 1 by mouth at bedtime    ] Laboratory Results   Urine Tests    Routine Urinalysis   Glucose: negative   (Normal Range: Negative) Bilirubin: negative   (Normal Range: Negative) Ketone: negative   (Normal Range: Negative) Spec. Gravity: 1.010   (Normal Range: 1.003-1.035) Blood: negative   (Normal Range: Negative) pH: 6.0   (Normal Range: 5.0-8.0) Protein: negative    (Normal Range: Negative) Urobilinogen: 0.2   (Normal Range: 0-1) Nitrite: negative   (Normal Range: Negative) Leukocyte Esterace: negative   (Normal Range: Negative)

## 2010-02-06 NOTE — Assessment & Plan Note (Signed)
Summary: F/U ERCP AND LABS              DEBORAH    History of Present Illness Visit Type: Follow-up Visit Primary GI MD: Melvia Heaps MD [facility] Primary Provider: Willow Ora, MD Chief Complaint: F/U ERCP [name]  has return following his ERCP from April 13, 2008.  A single bile duct stone was removed following sphincterotomy.  Since that time he has had no further episodes of pain.  Altogether he is feeling quite well.  He has occasional episodes of pyrosis when he belches.   GI Review of Systems    Reports acid reflux and  belching.      Denies abdominal pain, bloating, chest pain, dysphagia with liquids, dysphagia with solids, heartburn, loss of appetite, nausea, vomiting, vomiting blood, weight loss, and  weight gain.        Denies anal fissure, black tarry stools, change in bowel habit, constipation, diarrhea, diverticulosis, fecal incontinence, heme positive stool, hemorrhoids, irritable bowel syndrome, jaundice, light color stool, liver problems, rectal bleeding, and  rectal pain.    Current Medications (verified): 1)  Hydrochlorothiazide 25 Mg Tabs (Hydrochlorothiazide) .Marland Kitchen.. 1 By Mouth Qd 2)  Atenolol 50 Mg Tabs (Atenolol) .Marland Kitchen.. 1 By Mouth Qd 3)  Prilosec 20 Mg  Cpdr (Omeprazole) .Marland Kitchen.. 1 By Mouth Bid 4)  Benicar 20 Mg  Tabs (Olmesartan Medoxomil) .Marland Kitchen.. 1 By Mouth Once Daily 5)  Flomax 0.4 Mg  Cp24 (Tamsulosin Hcl) .Marland Kitchen.. 1 By Mouth Once Daily 6)  Calcium 7)  Mulit-Vitamin 8)  Bayer Low Strength 81 Mg  Tbec (Aspirin) 9)  Amlodipine Besylate 5 Mg  Tabs (Amlodipine Besylate) .Marland Kitchen.. 1 By Mouth Once Daily 10)  Simvastatin 20 Mg  Tabs (Simvastatin) .... 1/2  By Mouth At Bedtime 11)  Fish Oil 1000 Mg Caps (Omega-3 Fatty Acids) .... Once Daily 12)  Gnp Flax Seed Oil 1000 Mg Caps (Flaxseed (Linseed)) .... Once Daily 13)  Calcium 600/vitamin D 600-400 Mg-Unit Tabs (Calcium Carbonate-Vitamin D) .... Once Daily 14)  Centrum Silver  Tabs (Multiple Vitamins-Minerals) .... Once Daily 15)   Fexofenadine Hcl 180 Mg Tabs (Fexofenadine Hcl) .Marland Kitchen.. 1 Once Daily 16)  Singulair 10 Mg Tabs (Montelukast Sodium) .Marland Kitchen.. 1 Once Daily  Allergies (verified): No Known Drug Allergies  Past History:  Past Medical History:    Reviewed history from 03/26/2008 and no changes required:    Allergic rhinitis    GERD w/ esoph stricture    DM II    HYPERLIPIDEMIA    Hypertension    Benign prostatic hypertrophy    Osteoarthritis    h/o CVA, per CT "old stroke"    INTERNAL HEMORRHOIDS      h/o  ANEMIA NOS     COLONIC POLYPS (ICD-211.3)    DEXA normal 10-07    NCS 5-07: polyneuropathy, ?diabetic    6-06 (-) cardiolite    anxiety  Past Surgical History:    Reviewed history from 12/19/2007 and no changes required:    Total knee replacement, 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 Inguinal hernia repair (per patient L in the 90s, R in the 80s)  Family History:    Reviewed history from 06/02/2007 and no changes required:       [place]  Social History:    Reviewed history from 04/12/2008 and no changes required:       Retired       Married       has  two children, one in [place] and one in [place]       Patient has never smoked.        Alcohol Use - no       Daily Caffeine Use-2       Illicit Drug Use - no  Review of Systems       The patient complains of allergy/sinus, arthritis/joint pain, back pain, change in vision, hearing problems, and vision changes.    Vital Signs:  Patient profile:   77 year old male Height:      66 inches Weight:      131 pounds BMI:     21.22 Pulse rate:   76 / minute Pulse rhythm:   regular BP sitting:   144 / 70  (right arm)  Vitals Entered By: June McMurray CMA (May 09, 2008 10:21 AM)   Impression & Recommendations:  Problem # 1:  ABDOMINAL PAIN, EPIGASTRIC (ICD-789.06) Assessment Improved status post sphincterotomy and bile duct stone removal.  LFTs have normalized.

## 2010-02-06 NOTE — Assessment & Plan Note (Signed)
Summary: F/U//CA   Vital Signs:  Patient Profile:   77 Years Old Male Weight:      132.8 pounds Pulse rate:   82 / minute Pulse rhythm:   regular BP sitting:   142 / 70  (left arm) Cuff size:   regular  Vitals Entered By: Shary Decamp (August 24, 2006 3:03 PM)               Chief Complaint:  roa.  History of Present Illness: doing well to have left knee replacement in September, needs clearance.  Current Allergies (reviewed today): No known allergies  Updated/Current Medications (including changes made in today's visit):  HYDROCHLOROTHIAZIDE 25 MG TABS (HYDROCHLOROTHIAZIDE)  ATENOLOL 50 MG TABS (ATENOLOL)  PRILOSEC 20 MG  CPDR (OMEPRAZOLE) 1 by mouth BID BENICAR 20 MG  TABS (OLMESARTAN MEDOXOMIL) 1 by mouth once daily FLOMAX 0.4 MG  CP24 (TAMSULOSIN HCL) 1 by mouth once daily * CALCIUM  * MULIT-VITAMIN    Past Medical History:    Allergic rhinitis    GERD    Hypertension    Benign prostatic hypertrophy    Osteoarthritis     Review of Systems       very happy with the results off the previews knee replacement. Able to walk and do all his normal activities. Denies chest pain, shortness of breath or lower extremity edema. Recently had a colonoscopy by Dr. Arlyce Dice, records pending   Physical Exam  General:     alert, well-developed, and well-nourished.  not pale or icteric Neck:     no JVD at 45 degree angle Lungs:     normal respiratory effort, no intercostal retractions, and no accessory muscle use.   Heart:     normal rate, regular rhythm, and no murmur.   Abdomen:     soft, non-tender, no hepatomegaly, and no splenomegaly.   Extremities:     no edema    Impression & Recommendations:  Problem # 1:  OSTEOARTHRITIS (ICD-715.90) to have another knee replacement he tolerated a similar procedure recently without any problems. No chest pain or CHF type of symptoms. Will clear patient for surgery   Problem # 2:  ANEMIA NOS  (ICD-285.9) currently on iron recent colonoscopy done last hemoglobin normal.  d/c iron Hgb: 13.9 (06/28/2006)   Hct: 40.3 (06/28/2006)   RDW: 15.1 (06/28/2006)   MCV: 91.9 (06/28/2006)   MCHC: 34.5 (06/28/2006) TSH: 2.63 (06/28/2006)   Problem # 3:  HYPERGLYCEMIA (ICD-790.29) the patient has been noted to have hyperglycemia from time to time. A1c a few months ago was normal. Recheck A1c on return to the office Labs Reviewed: HgBA1c: 5.2 (05/18/2006)   Creat: 0.7 (05/18/2006)      Complete Medication List: 1)  Hydrochlorothiazide 25 Mg Tabs (Hydrochlorothiazide) 2)  Atenolol 50 Mg Tabs (Atenolol) 3)  Prilosec 20 Mg Cpdr (Omeprazole) .Marland Kitchen.. 1 by mouth bid 4)  Benicar 20 Mg Tabs (Olmesartan medoxomil) .Marland Kitchen.. 1 by mouth once daily 5)  Flomax 0.4 Mg Cp24 (Tamsulosin hcl) .Marland Kitchen.. 1 by mouth once daily 6)  Calcium  7)  Mulit-vitamin       MLI_PICT  letter

## 2010-02-06 NOTE — Progress Notes (Signed)
Summary: PAZ--REFILL  Phone Note Refill Request   Refills Requested: Medication #1:  VITAMIN D 1.25 2 x/wk [facility] [address] [phone] [id number]  Initial call taken by: Freddy Jaksch,  June 06, 2007 1:46 PM      Prescriptions: VITAMIN D 1.25 2 x/wk  #8 x 1   Entered by:   Shary Decamp   Authorized by:   Nolon Rod. Paz MD   Signed by:   Shary Decamp on 06/06/2007   Method used:   Faxed to ...       [facility]  [address] # [id number]*       [address]       [place], [place]  [address]       Ph: [phone] or [phone]       Fax: [phone]   RxID:   [id number]

## 2010-02-06 NOTE — Letter (Signed)
Summary: Discharge Summary  Discharge Summary   Imported By: Freddy Jaksch 06/02/2006 11:35:55  _____________________________________________________________________  External Attachment:    Type:   Image     Comment:   DISCHARGE SUMMARY

## 2010-02-06 NOTE — Assessment & Plan Note (Signed)
Summary: DRY COUGH,CONGESTION,THROAT IRRITATION/SCM   Vital Signs:  Patient Profile:   77 Years Old Male Weight:      135.50 pounds Temp:     97.0 degrees F oral Pulse rate:   68 / minute Pulse rhythm:   regular Resp:     17 per minute BP sitting:   100 / 52  (left arm) Cuff size:   large  Vitals Entered By: Wendall Stade (May 23, 2007 4:49 PM)                 Chief Complaint:  sick for one week and Cough.  History of Present Illness: dry cough sneezing back pain throat irritation no fever,chills, or sweats  Cough; Rx: none      This is a 77 year old man who presents with Cough.  The patient reports non-productive cough, but denies productive cough, pleuritic chest pain, shortness of breath, wheezing, exertional dyspnea, fever, hemoptysis, and malaise.  The patient denies the following symptoms: cold/URI symptoms, sore throat, nasal congestion, chronic rhinitis, weight loss, acid reflux symptoms, and peripheral edema.  The cough is worse with lying down.  Risk factors include history of reflux.      Current Allergies: No known allergies   Past Medical History:    Reviewed history from 03/10/2007 and no changes required:       Allergic rhinitis       GERD       Hypertension       Benign prostatic hypertrophy       Osteoarthritis       BIL. Inguinal hernia, total knee replacements.  Past Surgical History:    Reviewed history from 10/04/2006 and no changes required:       Total knee replacement, 05-2006 (R)       Total knee replacement, 08-2006 (L)   Social History:    Reviewed history from 06/28/2006 and no changes required:       Retired       Married       has two children, one in [place] and one in [place]     Physical Exam  General:     Well-developed,well-nourished,in no acute distress; alert,appropriate and cooperative throughout examination Ears:     External ear exam shows no significant lesions or deformities.  Otoscopic examination reveals  clear canals, tympanic membranes are intact bilaterally without bulging, retraction, inflammation or discharge. Hearing is grossly normal bilaterally. Nose:     External nasal examination shows no deformity or inflammation. Nasal mucosa are pink and moist without lesions or exudates. Mouth:     Oral mucosa and oropharynx without lesions or exudates.  Teeth in good repair.S/P uvulectomy Lungs:     Normal respiratory effort, chest expands symmetrically. Lungs are clear to auscultation, no crackles or wheezes.Dry cough Heart:     Normal rate and regular rhythm. S1 and S2 normal without gallop, murmur, click, rub or other extra sounds. Cervical Nodes:     No lymphadenopathy noted Axillary Nodes:     No palpable lymphadenopathy    Impression & Recommendations:  Problem # 1:  BRONCHITIS-ACUTE (ICD-466.0)  His updated medication list for this problem includes:    Azithromycin 250 Mg Tabs (Azithromycin) .Marland Kitchen... As per pack    Hycodan 5-1.5 Mg/54ml Syrp (Hydrocodone-homatropine) .Marland Kitchen... 1 tsp q 6 hrs prn   Complete Medication List: 1)  Hydrochlorothiazide 25 Mg Tabs (Hydrochlorothiazide) .Marland Kitchen.. 1 by mouth qd 2)  Atenolol 50 Mg Tabs (Atenolol) .Marland Kitchen.. 1 by mouth  qd 3)  Prilosec 20 Mg Cpdr (Omeprazole) .Marland Kitchen.. 1 by mouth bid 4)  Benicar 20 Mg Tabs (Olmesartan medoxomil) .Marland Kitchen.. 1 by mouth once daily 5)  Flomax 0.4 Mg Cp24 (Tamsulosin hcl) .Marland Kitchen.. 1 by mouth once daily 6)  Calcium  7)  Mulit-vitamin  8)  Bayer Low Strength 81 Mg Tbec (Aspirin) 9)  Amlodipine Besylate 5 Mg Tabs (Amlodipine besylate) .Marland Kitchen.. 1 by mouth once daily 10)  Azithromycin 250 Mg Tabs (Azithromycin) .... As per pack 11)  Hycodan 5-1.5 Mg/39ml Syrp (Hydrocodone-homatropine) .Marland Kitchen.. 1 tsp q 6 hrs prn   Patient Instructions: 1)  Drink as much fluid as you can tolerate for the next few days.   Prescriptions: HYCODAN 5-1.5 MG/5ML  SYRP (HYDROCODONE-HOMATROPINE) 1 tsp q 6 hrs prn  #120cc x 0   Entered and Authorized by:   Marga Melnick MD    Signed by:   Marga Melnick MD on 05/23/2007   Method used:   Print then Give to Patient   RxID:   [id number] AZITHROMYCIN 250 MG  TABS (AZITHROMYCIN) as per pack  #1 pack x 0   Entered and Authorized by:   Marga Melnick MD   Signed by:   Marga Melnick MD on 05/23/2007   Method used:   Print then Give to Patient   RxID:   [id number]  ]

## 2010-02-06 NOTE — Progress Notes (Signed)
Summary: LABS/REV SCHEDULED   ---- Converted from flag ---- ---- 04/13/2008 5:40 PM, Louis Meckel MD wrote: Small CBD stone was removed. Pt needs repeat LFTs, OV when I return. If he develops recurrent pain he'll need a ultrasound and reevaluation. ------------------------------  Phone Note Outgoing Call   Call placed by: Laureen Ochs LPN,  April 16, 2008 10:59 AM Call placed to: Patient Summary of Call: Pt. will have labs drawn on 05-07-08 and REV is scheduled for 05-09-08 at 10:30am. Pt. knows to callback immediately for any worsening symptoms.  Initial call taken by: Laureen Ochs LPN,  April 16, 2008 11:00 AM

## 2010-02-06 NOTE — Assessment & Plan Note (Signed)
Summary: emp yearly/alr   Vital Signs:  Patient profile:   77 year old male Height:      66 inches Weight:      135.4 pounds BMI:     21.93 Pulse rate:   60 / minute BP sitting:   122 / 60  Vitals Entered By: Shary Decamp (March 12, 2009 12:57 PM)   History of Present Illness: yearly, chart reviewed  R 2nd toe pain , has a callous  needs med RF request an  EKG      Current Medications (verified): 1)  Hydrochlorothiazide 25 Mg Tabs (Hydrochlorothiazide) .... 1/2 By Mouth Once Daily 2)  Atenolol 50 Mg Tabs (Atenolol) .... 1/2 By Mouth Once Daily 3)  Prilosec 20 Mg  Cpdr (Omeprazole) .Marland Kitchen.. 1 By Mouth Bid 4)  Benicar 20 Mg  Tabs (Olmesartan Medoxomil) .Marland Kitchen.. 1 By Mouth Once Daily - 5)  Flomax 0.4 Mg  Cp24 (Tamsulosin Hcl) .Marland Kitchen.. 1 By Mouth Once Daily 6)  Calcium 7)  Mulit-Vitamin 8)  Bayer Low Strength 81 Mg  Tbec (Aspirin) 9)  Amlodipine Besylate 5 Mg  Tabs (Amlodipine Besylate) .Marland Kitchen.. 1 By Mouth Once Daily 10)  Simvastatin 20 Mg  Tabs (Simvastatin) .... 1/2  By Mouth At Bedtime 11)  Fish Oil 1000 Mg Caps (Omega-3 Fatty Acids) .... Once Daily 12)  Gnp Flax Seed Oil 1000 Mg Caps (Flaxseed (Linseed)) .... Once Daily 13)  Centrum Silver  Tabs (Multiple Vitamins-Minerals) .... Once Daily  Allergies (verified): No Known Drug Allergies  Past History:  Past Medical History: Reviewed history from 02/19/2009 and no changes required. Allergic rhinitis GERD w/ esoph stricture DM II HYPERLIPIDEMIA Hypertension Benign prostatic hypertrophy Osteoarthritis h/o CVA, per CT "old stroke" INTERNAL HEMORRHOIDS   h/o  ANEMIA NOS  COLONIC POLYPS (ICD-211.3) DEXA normal 10-07 NCS 5-07: polyneuropathy, ?diabetic 6-06 (-) cardiolite anxiety  Past Surgical History: Total knee replacement, 05-2006 (R) Total knee replacement, 08-2006 (L) BIL. Inguinal hernia repair (per patient L in the 90s, R in the 80s) Cataract extraction (01/2009) left  Family History: DM-- B MI-- B age around 19,  another B with heart problems dx 14 colon ca--no prostate ca--no liver Ca-- B  Social History: Retired Married original from [place] has two children, one in [place] and one in [place] Patient has never smoked.  Alcohol Use - no Daily Caffeine Use-2 Illicit Drug Use - no  Review of Systems CV:  Denies chest pain or discomfort and swelling of feet. Resp:  Denies cough and shortness of breath. GI:  Denies bloody stools, diarrhea, nausea, and vomiting. GU:  Denies dysuria; some nocturia x 2-3/ night  on flomax .  Physical Exam  General:  alert, well-developed, and well-nourished.   Neck:  no masses and no thyromegaly.   Lungs:  normal respiratory effort, no intercostal retractions, no accessory muscle use, and normal breath sounds.   Heart:  normal rate, regular rhythm, and no murmur.   Abdomen:  soft, non-tender, no distention, no masses, no hepatomegaly, and no splenomegaly.   Extremities:  no edema right second toe has a 4 x 2 mm callus, no redness or discharge Psych:  not anxious appearing and not depressed appearing.     Impression & Recommendations:  Problem # 1:  BENIGN PROSTATIC HYPERTROPHY (ICD-600.00) reports he sees urology q 6 months   Problem # 2:  HEALTH SCREENING (ICD-V70.0) conseled  about diet and exercise T d 2000 Pneumovax l (10/05/2004)  Colonoscopy 08/20/2006 : adenomatous polyp, Dr Arlyce Dice,  next 2013 Bone Density: 10/21/2005,   normal DEXA 11-09 neg    PSA per urology, see #1  Orders: EKG w/ Interpretation (93000)  Problem # 3:  HYPERTENSION (ICD-401.9) at goal  EKG unchanged His updated medication list for this problem includes:    Hydrochlorothiazide 25 Mg Tabs (Hydrochlorothiazide) .Marland Kitchen... 1/2 by mouth once daily    Atenolol 50 Mg Tabs (Atenolol) .Marland Kitchen... 1/2 by mouth once daily    Benicar 20 Mg Tabs (Olmesartan medoxomil) .Marland Kitchen... 1 by mouth once daily -    Amlodipine Besylate 5 Mg Tabs (Amlodipine besylate) .Marland Kitchen... 1 by mouth once daily  BP  today: 122/60 Prior BP: 118/56 (02/19/2009)  Labs Reviewed: K+: 4.2 (02/19/2009) Creat: : 0.8 (02/19/2009)   Chol: 117 (03/14/2008)   HDL: 38 (03/14/2008)   LDL: 61 (03/14/2008)   TG: 91 (03/14/2008)  Problem # 4:  HYPERLIPIDEMIA (ICD-272.4) labs  His updated medication list for this problem includes:    Simvastatin 20 Mg Tabs (Simvastatin) .Marland Kitchen... 1/2  by mouth at bedtime  Labs Reviewed: SGOT: 27 (02/19/2009)   SGPT: 24 (02/19/2009)   HDL:38 (03/14/2008), 30.6 (09/16/2007)  LDL:61 (03/14/2008), 40 (09/16/2007)  Chol:117 (03/14/2008), 87 (09/16/2007)  Trig:91 (03/14/2008), 80 (09/16/2007)  Problem # 5:  DIABETES MELLITUS, TYPE II (ICD-250.00) diet controlled His updated medication list for this problem includes:    Benicar 20 Mg Tabs (Olmesartan medoxomil) .Marland Kitchen... 1 by mouth once daily -    Bayer Low Strength 81 Mg Tbec (Aspirin)  Labs Reviewed: Creat: 0.8 (02/19/2009)    Reviewed HgBA1c results: 5.5 (02/19/2009)  5.7 (03/14/2008)  Complete Medication List: 1)  Hydrochlorothiazide 25 Mg Tabs (Hydrochlorothiazide) .... 1/2 by mouth once daily 2)  Atenolol 50 Mg Tabs (Atenolol) .... 1/2 by mouth once daily 3)  Prilosec 20 Mg Cpdr (Omeprazole) .Marland Kitchen.. 1 by mouth bid 4)  Benicar 20 Mg Tabs (Olmesartan medoxomil) .Marland Kitchen.. 1 by mouth once daily - 5)  Flomax 0.4 Mg Cp24 (Tamsulosin hcl) .Marland Kitchen.. 1 by mouth once daily 6)  Calcium  7)  Mulit-vitamin  8)  Bayer Low Strength 81 Mg Tbec (Aspirin) 9)  Amlodipine Besylate 5 Mg Tabs (Amlodipine besylate) .Marland Kitchen.. 1 by mouth once daily 10)  Simvastatin 20 Mg Tabs (Simvastatin) .... 1/2  by mouth at bedtime 11)  Fish Oil 1000 Mg Caps (Omega-3 fatty acids) .... Once daily 12)  Gnp Flax Seed Oil 1000 Mg Caps (Flaxseed (linseed)) .... Once daily 13)  Centrum Silver Tabs (Multiple vitamins-minerals) .... Once daily  Patient Instructions: 1)  come back fasting,  FLP  DX hyperlipidemia 2)  Please schedule a follow-up appointment in 6 months .   Prescriptions: FLOMAX 0.4 MG  CP24 (TAMSULOSIN HCL) 1 by mouth once daily  #90 x 2   Entered by:   Shary Decamp   Authorized by:   Nolon Rod. Paz MD   Signed by:   Shary Decamp on 03/12/2009   Method used:   Print then Give to Patient   RxID:   [id number] SIMVASTATIN 20 MG  TABS (SIMVASTATIN) 1/2  by mouth at bedtime  #45 x 2   Entered by:   Shary Decamp   Authorized by:   Nolon Rod. Paz MD   Signed by:   Shary Decamp on 03/12/2009   Method used:   Print then Give to Patient   RxID:   [id number] AMLODIPINE BESYLATE 5 MG  TABS (AMLODIPINE BESYLATE) 1 by mouth once daily  #90 x 2   Entered by:   Shary Decamp  Authorized by:   Nolon Rod Paz MD   Signed by:   Shary Decamp on 03/12/2009   Method used:   Print then Give to Patient   RxID:   [id number] BENICAR 20 MG  TABS (OLMESARTAN MEDOXOMIL) 1 by mouth once daily -  #90 x 2   Entered by:   Shary Decamp   Authorized by:   Nolon Rod. Paz MD   Signed by:   Shary Decamp on 03/12/2009   Method used:   Print then Give to Patient   RxID:   [id number] PRILOSEC 20 MG  CPDR (OMEPRAZOLE) 1 by mouth BID  #180 x 2   Entered by:   Shary Decamp   Authorized by:   Nolon Rod. Paz MD   Signed by:   Shary Decamp on 03/12/2009   Method used:   Print then Give to Patient   RxID:   [id number] ATENOLOL 50 MG TABS (ATENOLOL) 1/2 by mouth once daily  #45 x 2   Entered by:   Shary Decamp   Authorized by:   Nolon Rod. Paz MD   Signed by:   Shary Decamp on 03/12/2009   Method used:   Print then Give to Patient   RxID:   [id number] HYDROCHLOROTHIAZIDE 25 MG TABS (HYDROCHLOROTHIAZIDE) 1/2 by mouth once daily  #45 x 2   Entered by:   Shary Decamp   Authorized by:   Nolon Rod. Paz MD   Signed by:   Shary Decamp on 03/12/2009   Method used:   Print then Give to Patient   RxID:   [id number]    Preventive Care Screening  Prior Values:    Colonoscopy:  normal (08/20/2006)    Bone Density:  normal (10/21/2005)    Last Tetanus  Booster:  Historical (11/26/1998)    Last Flu Shot:  Fluvax MCR (09/28/2007)    Last Pneumovax:  Historical (10/05/2004)    Dexa Interp:  normal (10/21/2005)    Risk Factors: Tobacco use:  never Drug use:  no Alcohol use:  no  Colonoscopy History:    Date of Last Colonoscopy:  08/20/2006

## 2010-02-06 NOTE — Assessment & Plan Note (Signed)
Summary: BAD COUGH/STUFFY NOSE/KN   Vital Signs:  Patient profile:   77 year old male Weight:      131.38 pounds O2 Sat:      99 % on Room air Temp:     97.7 degrees F oral Pulse rate:   73 / minute Pulse rhythm:   regular BP sitting:   120 / 76  (left arm) Cuff size:   regular  Vitals Entered By: Army Fossa CMA (December 04, 2009 11:31 AM)  O2 Flow:  Room air CC: Pt here c/o head and chest congestion  Comments x 2 days Took Zyrtec last night [facility] [address]    History of Present Illness: one day history of sneezing and runny nose developed cough today He was able to forcefully expel a small amount of mucus this morning with traces of blood  Current Medications (verified): 1)  Hydrochlorothiazide 25 Mg Tabs (Hydrochlorothiazide) .... 1/2 By Mouth Once Daily 2)  Atenolol 50 Mg Tabs (Atenolol) .... 1/2 By Mouth Once Daily 3)  Prilosec 20 Mg  Cpdr (Omeprazole) .Marland Kitchen.. 1 By Mouth Bid 4)  Benicar 20 Mg  Tabs (Olmesartan Medoxomil) .Marland Kitchen.. 1 By Mouth Once Daily - 5)  Flomax 0.4 Mg  Cp24 (Tamsulosin Hcl) .Marland Kitchen.. 1 By Mouth Once Daily 6)  Calcium 7)  Mulit-Vitamin 8)  Bayer Low Strength 81 Mg  Tbec (Aspirin) 9)  Amlodipine Besylate 5 Mg  Tabs (Amlodipine Besylate) .Marland Kitchen.. 1 By Mouth Once Daily 10)  Simvastatin 20 Mg  Tabs (Simvastatin) .... 1/2  By Mouth At Bedtime 11)  Fish Oil 1000 Mg Caps (Omega-3 Fatty Acids) .... Once Daily 12)  Gnp Flax Seed Oil 1000 Mg Caps (Flaxseed (Linseed)) .... Once Daily 13)  Centrum Silver  Tabs (Multiple Vitamins-Minerals) .... Once Daily 14)  Mobic 15 Mg Tabs (Meloxicam) .Marland Kitchen.. 1 By Mouth Daily  Allergies (verified): No Known Drug Allergies  Past History:  Past Medical History: Reviewed history from 11/05/2009 and no changes required. Allergic rhinitis GERD w/ esoph stricture DM II HYPERLIPIDEMIA Hypertension Benign prostatic hypertrophy Osteoarthritis h/o CVA, per CT "old stroke" INTERNAL HEMORRHOIDS   h/o  ANEMIA NOS  COLONIC  POLYPS (ICD-211.3) DEXA normal 10-07 NCS 5-07: polyneuropathy, ?diabetic 6-06 (-) cardiolite anxiety  Past Surgical History: Reviewed history from 03/12/2009 and no changes required. Total knee replacement, 05-2006 (R) Total knee replacement, 08-2006 (L) BIL. Inguinal hernia repair (per patient L in the 90s, R in the 80s) Cataract extraction (01/2009) left  Review of Systems General:  Denies fever. Eyes:  Denies discharge and eye irritation. GI:  Denies diarrhea, nausea, and vomiting.  Physical Exam  General:  alert and well-developed.   Head:  face symmetric Ears:  R ear normal and L ear normal.   Nose:  slightly congested Mouth:  no redness or discharge, he does have a 3 mm ecchymoses? Hematoma? Hemangioma? At the left Arcus palatoglossus Lungs:  normal respiratory effort, no intercostal retractions, no accessory muscle use, and normal breath sounds.   Heart:  normal rate, regular rhythm, and no murmur.     Impression & Recommendations:  Problem # 1:  URI (ICD-465.9) URI symptoms, instructions He does have a ecchymoses? At the left Arcus palatoglossus plan: Come back in 2 weeks to reassess that area, may be related to forceful cough this morning His updated medication list for this problem includes:    Bayer Low Strength 81 Mg Tbec (Aspirin)    Mobic 15 Mg Tabs (Meloxicam) .Marland Kitchen... 1 by mouth daily  Complete Medication List: 1)  Hydrochlorothiazide 25 Mg Tabs (Hydrochlorothiazide) .... 1/2 by mouth once daily 2)  Atenolol 50 Mg Tabs (Atenolol) .... 1/2 by mouth once daily 3)  Prilosec 20 Mg Cpdr (Omeprazole) .Marland Kitchen.. 1 by mouth bid 4)  Benicar 20 Mg Tabs (Olmesartan medoxomil) .Marland Kitchen.. 1 by mouth once daily - 5)  Flomax 0.4 Mg Cp24 (Tamsulosin hcl) .Marland Kitchen.. 1 by mouth once daily 6)  Calcium  7)  Mulit-vitamin  8)  Bayer Low Strength 81 Mg Tbec (Aspirin) 9)  Amlodipine Besylate 5 Mg Tabs (Amlodipine besylate) .Marland Kitchen.. 1 by mouth once daily 10)  Simvastatin 20 Mg Tabs (Simvastatin) ....  1/2  by mouth at bedtime 11)  Fish Oil 1000 Mg Caps (Omega-3 fatty acids) .... Once daily 12)  Gnp Flax Seed Oil 1000 Mg Caps (Flaxseed (linseed)) .... Once daily 13)  Centrum Silver Tabs (Multiple vitamins-minerals) .... Once daily 14)  Mobic 15 Mg Tabs (Meloxicam) .Marland Kitchen.. 1 by mouth daily 15)  Flonase 50 Mcg/act Susp (Fluticasone propionate) .... 2 sprays on each side of the nose daily  Patient Instructions: 1)  rest, fluids, Tylenol 2)  Robitussin-DM as needed for cough 3)  Flonase 2 sprays in each side of the nose daily   4)  Come back in 2 weeks to recheck the throat Prescriptions: FLONASE 50 MCG/ACT SUSP (FLUTICASONE PROPIONATE) 2 sprays on each side of the nose daily  #1 x 1   Entered and Authorized by:   Elita Quick E. Paz MD   Signed by:   Nolon Rod. Paz MD on 12/04/2009   Method used:   Print then Give to Patient   RxID:   [id number]    Orders Added: 1)  Est. Patient Level III [14782]

## 2010-02-06 NOTE — Assessment & Plan Note (Signed)
  Nurse Visit    Preventive Screening-Counseling & Management  Comments: Patient referred by Dr. Arlyce Dice for [place] aspirin drug study but since patient takes 81 mg. once daily he is not eligible for this 325 mg aspirin study.   Allergies: No Known Drug Allergies

## 2010-02-06 NOTE — Progress Notes (Signed)
Summary: Medication   Phone Note Call from Patient Call back at Home Phone [phone]   Caller: Patient Summary of Call: Pt states during his OV he mentioned having a dry cough, sneezing, and eye irritation. He was wondering if you were going to call something in for him? Please advise. [facility] [address]  Follow-up for Phone Call        try over-the-counter Zyrtec 10 mg one p.o. daily Follow-up by: Nolon Rod. Paz MD,  November 05, 2009 1:15 PM  Additional Follow-up for Phone Call Additional follow up Details #1::        Left message for pt to call back. Army Fossa CMA  November 05, 2009 1:28 PM      Additional Follow-up for Phone Call Additional follow up Details #2::    Pt is aware. Army Fossa CMA  November 05, 2009 3:14 PM

## 2010-02-06 NOTE — Procedures (Signed)
Summary: [facility]ology [facility]  [facility]   Imported By: Harrietta Guardian 03/11/2007 16:21:56  _____________________________________________________________________  External Attachment:    Type:   Image     Comment:   External Document

## 2010-02-06 NOTE — Procedures (Signed)
Summary: Gastroenterology--COLONOSCOPY  Gastroenterology--COLONOSCOPY   Imported By: Freddy Jaksch 08/25/2006 15:40:29  _____________________________________________________________________  External Attachment:    Type:   Image     Comment:   GI

## 2010-02-06 NOTE — Assessment & Plan Note (Signed)
Summary: FOLLOW UP/CDJ   Vital Signs:  Patient Profile:   77 Years Old Male Weight:      136 pounds Pulse rate:   64 / minute BP sitting:   120 / 62  Vitals Entered By: Shary Decamp (September 14, 2007 10:38 AM)                 Chief Complaint:  ROV - NOT FASTING; PT STATES THAT THE VIT D IS VERY EXPENSIVE.  History of Present Illness: ROV - NOT FASTING    DM II-- ambulatory CBGs 105 to 120 fasting    HYPERLIPIDEMIA--on meds,no s/e    Hypertension-ambulatory BPs <125/70, good med compliance    Benign prostatic hypertrophy--to see Dr Aldean Ast tomorrow    Vit D deficiency-- suplements expensive, takes  them  twice a week    Prior Medication List:  HYDROCHLOROTHIAZIDE 25 MG TABS (HYDROCHLOROTHIAZIDE) 1 by mouth qd ATENOLOL 50 MG TABS (ATENOLOL) 1 by mouth qd PRILOSEC 20 MG  CPDR (OMEPRAZOLE) 1 by mouth BID BENICAR 20 MG  TABS (OLMESARTAN MEDOXOMIL) 1 by mouth once daily FLOMAX 0.4 MG  CP24 (TAMSULOSIN HCL) 1 by mouth once daily * CALCIUM  * MULIT-VITAMIN  BAYER LOW STRENGTH 81 MG  TBEC (ASPIRIN)  AMLODIPINE BESYLATE 5 MG  TABS (AMLODIPINE BESYLATE) 1 by mouth once daily * VITAMIN D 1.25 2 x/wk SIMVASTATIN 20 MG  TABS (SIMVASTATIN) 1 by mouth at bedtime   Current Allergies (reviewed today): No known allergies   Past Medical History:    Reviewed history from 06/02/2007 and no changes required:       Allergic rhinitis       GERD w/ esoph stricture       DM II       HYPERLIPIDEMIA       Hypertension       Benign prostatic hypertrophy       Osteoarthritis       h/o CVA, per CT "old stroke"       INTERNAL HEMORRHOIDS (ICD-455.0)       h/o  ANEMIA NOS (ICD-285.9)       COLONIC POLYPS (ICD-211.3)       DEXA normal 10-07       NCS 5-07: polyneuropathy, ?diabetic       6-06 (-) cardiolite         Past Surgical History:    Reviewed history from 06/02/2007 and no changes required:       Total knee replacement, 05-2006 (R)       Total knee replacement, 08-2006 (L)       BIL. Inguinal hernia repair   Social History:    Reviewed history from 06/28/2006 and no changes required:       Retired       Married       has two children, one in [place] and one in [place]    Review of Systems  CV      Denies chest pain or discomfort and swelling of feet.  Resp      Denies cough and shortness of breath.  GI      Denies diarrhea and nausea.  Neuro      cont. w/ chronic feet numbness, no pain   Physical Exam  General:     alert and well-developed.   Lungs:     normal respiratory effort, no intercostal retractions, no accessory muscle use, and normal breath sounds.   Heart:     normal rate, regular  rhythm, and no murmur.   Abdomen:     soft, non-tender, no distention, and no masses.   Pulses:     normal pedal pulses bilaterally  Extremities:     no pretibial edema bilaterally  Psych:     Oriented X3, not anxious appearing, and not depressed appearing.    Diabetes Management Exam:    Foot Exam (with socks and/or shoes not present):       Sensory-Pinprick/Light touch:          Left medial foot (L-4): normal          Left dorsal foot (L-5): normal          Left lateral foot (S-1): normal          Right medial foot (L-4): normal          Right dorsal foot (L-5): normal          Right lateral foot (S-1): normal       Sensory-Monofilament:          Left foot: normal          Right foot: normal       Inspection:          Left foot: normal          Right foot: normal       Nails:          Left foot: thickened          Right foot: thickened    Impression & Recommendations:  Problem # 1:  HYPERLIPIDEMIA (ICD-272.4) started statins see instructions  His updated medication list for this problem includes:    Simvastatin 20 Mg Tabs (Simvastatin) .Marland Kitchen... 1 by mouth at bedtime  Labs Reviewed: 05-04-07: Na 131 , K 4.7, Cr 0.8, AST ALT Nl, Bil 2.3, CBC nl , Hg 15.6, UA neg, TC 204, HDL 56, LDL 129, TG 96, A1C 5.4, Vit D 16.2  (low) start  statins   Labs Reviewed: Chol: 176 (06/28/2006)   HDL: 39.3 (06/28/2006)   LDL: 100 (06/28/2006)   TG: 185 (06/28/2006) SGOT: 22 (06/28/2006)   SGPT: 15 (06/28/2006)   Problem # 2:  DIABETES MELLITUS, TYPE II (ICD-250.00) see instructions  despite Dx of neuropathy , neuro exam today is normal His updated medication list for this problem includes:    Benicar 20 Mg Tabs (Olmesartan medoxomil) .Marland Kitchen... 1 by mouth once daily    Bayer Low Strength 81 Mg Tbec (Aspirin)  Labs Reviewed: HgBA1c: 5.2 (05/18/2006)   Creat: 0.7 (10/04/2006)      Problem # 3:  HYPERTENSION (ICD-401.9) stable His updated medication list for this problem includes:    Hydrochlorothiazide 25 Mg Tabs (Hydrochlorothiazide) .Marland Kitchen... 1 by mouth qd    Atenolol 50 Mg Tabs (Atenolol) .Marland Kitchen... 1 by mouth qd    Benicar 20 Mg Tabs (Olmesartan medoxomil) .Marland Kitchen... 1 by mouth once daily    Amlodipine Besylate 5 Mg Tabs (Amlodipine besylate) .Marland Kitchen... 1 by mouth once daily  BP today: 120/62 Prior BP: 142/76 (06/02/2007)  Labs Reviewed: 05-04-07: Na 131 , K 4.7, Cr 0.8, AST ALT Nl, Bil 2.3, CBC nl , Hg 15.6  Problem # 4:  UNSPECIFIED VITAMIN D DEFICIENCY (ICD-268.9) recheck switch to daily, inexpensive Vit D  Complete Medication List: 1)  Hydrochlorothiazide 25 Mg Tabs (Hydrochlorothiazide) .Marland Kitchen.. 1 by mouth qd 2)  Atenolol 50 Mg Tabs (Atenolol) .Marland Kitchen.. 1 by mouth qd 3)  Prilosec 20 Mg Cpdr (Omeprazole) .Marland Kitchen.. 1 by mouth bid 4)  Benicar 20 Mg Tabs (Olmesartan medoxomil) .Marland Kitchen.. 1 by mouth once daily 5)  Flomax 0.4 Mg Cp24 (Tamsulosin hcl) .Marland Kitchen.. 1 by mouth once daily 6)  Calcium  7)  Mulit-vitamin  8)  Bayer Low Strength 81 Mg Tbec (Aspirin) 9)  Amlodipine Besylate 5 Mg Tabs (Amlodipine besylate) .Marland Kitchen.. 1 by mouth once daily 10)  Vitamin D Otc  .Marland Kitchen.. 1 a day 11)  Simvastatin 20 Mg Tabs (Simvastatin) .Marland Kitchen.. 1 by mouth at bedtime   Patient Instructions: 1)  came back fasting: 2)  FLP AS ALT Dx cholesterol 3)  A1C Dx DM 4)  Vit D level    Dx   268.9 5)  Please schedule a follow-up appointment in 4 months.   ]

## 2010-03-17 ENCOUNTER — Encounter: Payer: Self-pay | Admitting: Internal Medicine

## 2010-03-17 ENCOUNTER — Encounter (INDEPENDENT_AMBULATORY_CARE_PROVIDER_SITE_OTHER): Payer: Medicare HMO | Admitting: Internal Medicine

## 2010-03-17 DIAGNOSIS — Z0000 Encounter for general adult medical examination without abnormal findings: Secondary | ICD-10-CM

## 2010-03-17 DIAGNOSIS — E119 Type 2 diabetes mellitus without complications: Secondary | ICD-10-CM

## 2010-03-17 DIAGNOSIS — Z23 Encounter for immunization: Secondary | ICD-10-CM

## 2010-03-17 DIAGNOSIS — Z136 Encounter for screening for cardiovascular disorders: Secondary | ICD-10-CM

## 2010-03-17 DIAGNOSIS — J069 Acute upper respiratory infection, unspecified: Secondary | ICD-10-CM

## 2010-03-17 DIAGNOSIS — E785 Hyperlipidemia, unspecified: Secondary | ICD-10-CM

## 2010-03-17 DIAGNOSIS — M1990 Unspecified osteoarthritis, unspecified site: Secondary | ICD-10-CM

## 2010-03-25 NOTE — Assessment & Plan Note (Signed)
Summary: physical/fasting/kn   Vital Signs:  Patient profile:   77 year old male Height:      66 inches Weight:      139.25 pounds BMI:     22.56 Pulse rate:   78 / minute Pulse rhythm:   regular BP sitting:   126 / 72  (left arm) Cuff size:   regular  Vitals Entered By: Army Fossa CMA (March 17, 2010 8:36 AM) CC: CPX, not fasting  Comments c/o sneezing and dry cough x 1 day Insurance does not cover Benicar  [facility] [address]    History of Present Illness: Here for Medicare AWV: 1.   Risk factors based on Past M, S, F history: reviewed  2.   Physical Activities: walks most days  3.   Depression/mood: occasionally has depressive mood but rarely  4.   Hearing: poor hearing , uses hearing aids  5.   ADL's: independent  6.   Fall Risk: no recent falls  7.   Home Safety: does feel safe at home  8.   Height, weight, &visual acuity: see VS,  vision good, had B cataract done  9.   Counseling: yes  10.   Labs ordered based on risk factors: yes  11.           Referral Coordination, if needed  12.           Care Plan, see a/p 13.            Cognitive Assessment: memory and cognition wnl, motor skills normal, just limited by DJD  in addition we discissed the following:  c/o sneezing and dry cough x 2 day,  stuffy nose  HTN--Insurance does not cover Benicar , ambulatory BPs  wnl   GERD--  on PPIs, doing well  DM-- ambulatory CBGs  ~118 fasting , 130s post prandial  Benign prostatic hypertrophy-- needs referal to see Dr Aldean Ast Osteoarthritis-- still symptomatic, on meloxicam, pain mostly at shoulder, hands, will see ortho soon . See ROS  ROS  as far as DJD he denies fever, weight loss, headache or myalgias  denies chest pain or shortness of breath No nausea, vomiting, and diarrhea  no blood in the stools     Preventive Screening-Counseling & Management  Caffeine-Diet-Exercise     Does Patient Exercise: yes     Type of exercise: walking   Current Medications  (verified): 1)  Hydrochlorothiazide 25 Mg Tabs (Hydrochlorothiazide) .... 1/2 By Mouth Once Daily 2)  Atenolol 50 Mg Tabs (Atenolol) .... 1/2 By Mouth Once Daily 3)  Prilosec 20 Mg  Cpdr (Omeprazole) .Marland Kitchen.. 1 By Mouth Bid 4)  Benicar 20 Mg  Tabs (Olmesartan Medoxomil) .Marland Kitchen.. 1 By Mouth Once Daily - 5)  Flomax 0.4 Mg  Cp24 (Tamsulosin Hcl) .Marland Kitchen.. 1 By Mouth Once Daily 6)  Calcium 7)  Mulit-Vitamin 8)  Bayer Low Strength 81 Mg  Tbec (Aspirin) 9)  Amlodipine Besylate 5 Mg  Tabs (Amlodipine Besylate) .Marland Kitchen.. 1 By Mouth Once Daily 10)  Simvastatin 20 Mg  Tabs (Simvastatin) .... 1/2  By Mouth At Bedtime 11)  Fish Oil 1000 Mg Caps (Omega-3 Fatty Acids) .... Once Daily 12)  Gnp Flax Seed Oil 1000 Mg Caps (Flaxseed (Linseed)) .... Once Daily 13)  Centrum Silver  Tabs (Multiple Vitamins-Minerals) .... Once Daily 14)  Mobic 15 Mg Tabs (Meloxicam) .Marland Kitchen.. 1 By Mouth Daily 15)  Flonase 50 Mcg/act Susp (Fluticasone Propionate) .... 2 Sprays On Each Side of The Nose Daily  Allergies (verified): No Known Drug Allergies  Past History:  Past Medical History: Allergic rhinitis GERD w/ esoph stricture DM II HYPERLIPIDEMIA Hypertension Benign prostatic hypertrophy Osteoarthritis h/o CVA, per CT "old stroke" INTERNAL HEMORRHOIDS   h/o  ANEMIA NOS  COLONIC POLYPS (ICD-211.3) DEXA normal 10-07 NCS by neurology per pt 5-07: polyneuropathy, ?diabetic 6-06 (-) cardiolite anxiety  Social History: Does Patient Exercise:  yes  Physical Exam  General:  alert, well-developed, and well-nourished.   Ears:  R ear normal and L ear normal.   Nose:  not congested  Mouth:  no red or d/c  Neck:  no masses and no thyromegaly.  normal carotid upstroke.   Lungs:  normal respiratory effort, no intercostal retractions, no accessory muscle use, and normal breath sounds.   Heart:  normal rate, regular rhythm, and no murmur.   Abdomen:  soft, non-tender, no distention, and no masses.   Extremities:  no edema hands w.  changes c/w DJD  Psych:  Oriented X3, memory intact for recent and remote, normally interactive, good eye contact, not anxious appearing, and not depressed appearing.     Impression & Recommendations:  Problem # 1:  HEALTH SCREENING (ICD-V70.0)  T d 2000 and 3-12 Pneumovax 2006  shingles shot Rx provided, benefits discussed  Colonoscopy 08/20/2006 : adenomatous polyp, Dr Arlyce Dice,  next 2013  Bone Density: 10/21/2005,   normal DEXA 11-09 neg    PSA per urology , referal done diet, exercise discussed   Orders: Medicare -1st Annual Wellness Visit [id number])  Problem # 2:  URI (ICD-465.9) URI vs allergy symptoms x 2 days, see instructions   Problem # 3:  HYPERTENSION (ICD-401.9)   blood pressure well controlled but he likes to change Benicar, will switch to losartan. See instructions EKG NSR  The following medications were removed from the medication list:    Benicar 20 Mg Tabs (Olmesartan medoxomil) .Marland Kitchen... 1 by mouth once daily - His updated medication list for this problem includes:    Hydrochlorothiazide 25 Mg Tabs (Hydrochlorothiazide) .Marland Kitchen... 1/2 by mouth once daily    Atenolol 50 Mg Tabs (Atenolol) .Marland Kitchen... 1/2 by mouth once daily    Losartan Potassium 50 Mg Tabs (Losartan potassium) ..... One by mouth twice a day    Amlodipine Besylate 5 Mg Tabs (Amlodipine besylate) .Marland Kitchen... 1 by mouth once daily  BP today: 126/72 Prior BP: 120/76 (12/04/2009)  Labs Reviewed: K+: 4.9 (11/05/2009) Creat: : 0.9 (11/05/2009)   Chol: 114 (03/13/2009)   HDL: 45.20 (03/13/2009)   LDL: 40 (03/13/2009)   TG: 144.0 (03/13/2009)  Orders: EKG w/ Interpretation (93000)  Problem # 4:  HYPERLIPIDEMIA (ICD-272.4) labs  tolerating well simva and  and amlodipine,  he has pain likely related to DJD. No myalgias per se. No change for now His updated medication list for this problem includes:    Simvastatin 20 Mg Tabs (Simvastatin) .Marland Kitchen... 1/2  by mouth at bedtime  Labs Reviewed: SGOT: 27 (02/19/2009)    SGPT: 24 (02/19/2009)   HDL:45.20 (03/13/2009), 38 (03/14/2008)  LDL:40 (03/13/2009), 61 (03/14/2008)  Chol:114 (03/13/2009), 117 (03/14/2008)  Trig:144.0 (03/13/2009), 91 (03/14/2008)  Problem # 5:  DIABETES MELLITUS, TYPE II (ICD-250.00) labs  The following medications were removed from the medication list:    Benicar 20 Mg Tabs (Olmesartan medoxomil) .Marland Kitchen... 1 by mouth once daily - His updated medication list for this problem includes:    Losartan Potassium 50 Mg Tabs (Losartan potassium) ..... One by mouth twice a day    Bayer Low Strength  81 Mg Tbec (Aspirin)  Labs Reviewed: Creat: 0.9 (11/05/2009)    Reviewed HgBA1c results: 5.5 (02/19/2009)  5.7 (03/14/2008)  Problem # 6:  OSTEOARTHRITIS (ICD-715.90)   continue with arthralgias, no fever, weight loss or headaches. We'll see orthopedic surgery soon.   likes a referal to PT [facility] His updated medication list for this problem includes:    Bayer Low Strength 81 Mg Tbec (Aspirin)    Mobic 15 Mg Tabs (Meloxicam) .Marland Kitchen... 1 by mouth daily  Orders: Misc. Referral (Misc. Ref)  Complete Medication List: 1)  Simvastatin 20 Mg Tabs (Simvastatin) .... 1/2  by mouth at bedtime 2)  Hydrochlorothiazide 25 Mg Tabs (Hydrochlorothiazide) .... 1/2 by mouth once daily 3)  Atenolol 50 Mg Tabs (Atenolol) .... 1/2 by mouth once daily 4)  Losartan Potassium 50 Mg Tabs (Losartan potassium) .... One by mouth twice a day 5)  Amlodipine Besylate 5 Mg Tabs (Amlodipine besylate) .Marland Kitchen.. 1 by mouth once daily 6)  Flomax 0.4 Mg Cp24 (Tamsulosin hcl) .Marland Kitchen.. 1 by mouth once daily 7)  Calcium  8)  Mulit-vitamin  9)  Bayer Low Strength 81 Mg Tbec (Aspirin) 10)  Fish Oil 1000 Mg Caps (Omega-3 fatty acids) .... Once daily 11)  Gnp Flax Seed Oil 1000 Mg Caps (Flaxseed (linseed)) .... Once daily 12)  Centrum Silver Tabs (Multiple vitamins-minerals) .... Once daily 13)  Mobic 15 Mg Tabs (Meloxicam) .Marland Kitchen.. 1 by mouth daily 14)  Flonase 50 Mcg/act Susp  (Fluticasone propionate) .... 2 sprays on each side of the nose daily  Other Orders: Tdap => 53yrs IM (62952) Admin 1st Vaccine (84132) Urology Referral (Urology)  Patient Instructions: 1)   rest, fluids, Tylenol as needed and Robitussin-DM for the  stuffy nose. Call if not better in few days . 2)   stop Benicar, start losartan twice a day. call if your blood pressure is more than  160-90 or less than 105-70 3)   schedule a nurse visit  in 3 weeks , fasting, for the following 4)   blood pressure check 5)  BMP, CBC --- dx HTN 6)   hemoglobin A1c--- dx DM 7)  AST ALT, FLP--- dx hyperlipidemia  8)  Please schedule a follow-up appointment in 4 months .  Prescriptions: LOSARTAN POTASSIUM 50 MG TABS (LOSARTAN POTASSIUM) one by mouth twice a day  #60 x 6   Entered and Authorized by:   Elita Quick E. Paz MD   Signed by:   Nolon Rod. Paz MD on 03/17/2010   Method used:   Print then Give to Patient   RxID:   [id number] FLONASE 50 MCG/ACT SUSP (FLUTICASONE PROPIONATE) 2 sprays on each side of the nose daily  #1 x 3   Entered by:   Army Fossa CMA   Authorized by:   Nolon Rod. Paz MD   Signed by:   Army Fossa CMA on 03/17/2010   Method used:   Electronically to        [id number] (retail)       [address]       [place], [place]  [address]       Ph: [phone] or [phone]       Fax: [phone]   RxID:   [id number] SIMVASTATIN 20 MG  TABS (SIMVASTATIN) 1/2  by mouth at bedtime  #45 x 2   Entered by:   Army Fossa CMA   Authorized by:  Jose E. Paz MD   Signed by:   Army Fossa CMA on 03/17/2010   Method used:   Electronically to        [id number]* (retail)       [address]       [place], [place]  [address]       Ph: [phone] or [phone]       Fax: [phone]   RxID:   [id number] AMLODIPINE BESYLATE 5 MG  TABS (AMLODIPINE BESYLATE) 1 by mouth once daily  #90 x 2    Entered by:   Army Fossa CMA   Authorized by:   Nolon Rod. Paz MD   Signed by:   Army Fossa CMA on 03/17/2010   Method used:   Electronically to        [id number] (retail)       [address]       [place], [place]  [address]       Ph: [phone] or [phone]       Fax: [phone]   RxID:   [id number] FLOMAX 0.4 MG  CP24 (TAMSULOSIN HCL) 1 by mouth once daily  #90 x 2   Entered by:   Army Fossa CMA   Authorized by:   Nolon Rod. Paz MD   Signed by:   Army Fossa CMA on 03/17/2010   Method used:   Electronically to        [id number] (retail)       [address]       [place], [place]  [address]       Ph: [phone] or [phone]       Fax: [phone]   RxID:   [id number] PRILOSEC 20 MG  CPDR (OMEPRAZOLE) 1 by mouth BID  #180 x 2   Entered by:   Army Fossa CMA   Authorized by:   Nolon Rod. Paz MD   Signed by:   Army Fossa CMA on 03/17/2010   Method used:   Electronically to        [id number] (retail)       [address]       [place], [place]  [address]       Ph: [phone] or [phone]       Fax: [phone]   RxID:   [id number] ATENOLOL 50 MG TABS (ATENOLOL) 1/2 by mouth once daily  #45 x 2   Entered by:   Army Fossa CMA   Authorized by:   Nolon Rod. Paz MD   Signed by:   Army Fossa CMA on 03/17/2010   Method used:   Electronically to        [id number] (retail)       [address]       [place], [place]  [address]       Ph: [phone] or [phone]       Fax: [phone]   RxID:   [id number] HYDROCHLOROTHIAZIDE 25 MG TABS (HYDROCHLOROTHIAZIDE) 1/2 by mouth once daily  #45 x 2   Entered by:   Army Fossa CMA   Authorized by:   Nolon Rod. Paz MD  Signed by:   Army Fossa CMA on 03/17/2010   Method used:   Electronically to        [id number] (retail)       [address]       [place], [place]  [address]       Ph: [phone] or [phone]       Fax: [phone]   RxID:   [id number]    Orders Added: 1)  Tdap => 76yrs IM [90715] 2)  Admin 1st Vaccine [90471] 3)  EKG w/ Interpretation [93000] 4)  Urology Referral [Urology] 5)  Misc. Referral [Misc. Ref] 6)  Medicare -1st Annual Wellness Visit [G0438] 7)  Est. Patient Level III [27[address]]   Immunizations Administered:  Tetanus Vaccine:    Vaccine Type: Tdap    Site: right deltoid    Mfr: GlaxoSmithKline    Dose: 0.5 ml    Route: IM    Given by: Army Fossa CMA    Exp. Date: 11/29/2011    Lot #: [id number]   Immunizations Administered:  Tetanus Vaccine:    Vaccine Type: Tdap    Site: right deltoid    Mfr: GlaxoSmithKline    Dose: 0.5 ml    Route: IM    Given by: Army Fossa CMA    Exp. Date: 11/29/2011    Lot #: [id number]   Risk Factors:  Exercise:  yes    Type:  walking

## 2010-03-26 ENCOUNTER — Other Ambulatory Visit: Payer: Self-pay | Admitting: Internal Medicine

## 2010-03-26 NOTE — Telephone Encounter (Signed)
The number to call is [phone]. Medications to hold are: Flonase, Flomax, Amlodipine Besylate, Losartan Potassium, Atenolol, HCTZ, and Simvastin

## 2010-03-28 NOTE — Telephone Encounter (Signed)
done

## 2010-04-15 ENCOUNTER — Telehealth: Payer: Self-pay | Admitting: Internal Medicine

## 2010-04-15 NOTE — Telephone Encounter (Signed)
Patient's instructions at bottom of sheet of 03/17/2010 visit states to return fasting for a nurse visit in three weeks, then have fasting labs---patient wanted to "finish" old meds, then start new medication---his questions are--1)  He needs to see nurse for a blood pressure check three weeks after starting new meds --right??    And 2)  Does he do the fasting labs after starting the new meds  Or can he come for the labs at any time??

## 2010-04-15 NOTE — Telephone Encounter (Signed)
Patient has appt for fasting labs for Wed 04/16/2010----he is aware that he needs a nurse visit for Blood pressure check that needs to be 3 weeks after he starts his new meds--he will call us and make that appt

## 2010-04-15 NOTE — Telephone Encounter (Signed)
BP Check needs to be after he starts the new meds. He can come in fasting for labs at anytime.

## 2010-04-16 ENCOUNTER — Other Ambulatory Visit (INDEPENDENT_AMBULATORY_CARE_PROVIDER_SITE_OTHER): Payer: Medicare HMO

## 2010-04-16 DIAGNOSIS — E785 Hyperlipidemia, unspecified: Secondary | ICD-10-CM

## 2010-04-16 DIAGNOSIS — T887XXA Unspecified adverse effect of drug or medicament, initial encounter: Secondary | ICD-10-CM

## 2010-04-16 LAB — CBC WITH DIFFERENTIAL/PLATELET
Basophils Absolute: 0.1 10*3/uL (ref 0.0–0.1)
Basophils Relative: 1 % (ref 0.0–3.0)
Eosinophils Absolute: 0.3 10*3/uL (ref 0.0–0.7)
Eosinophils Relative: 4.9 % (ref 0.0–5.0)
HCT: 38.4 % — ABNORMAL LOW (ref 39.0–52.0)
Hemoglobin: 13.4 g/dL (ref 13.0–17.0)
Lymphocytes Relative: 28.1 % (ref 12.0–46.0)
Lymphs Abs: 1.7 10*3/uL (ref 0.7–4.0)
MCHC: 34.9 g/dL (ref 30.0–36.0)
MCV: 95.5 fl (ref 78.0–100.0)
Monocytes Absolute: 0.6 10*3/uL (ref 0.1–1.0)
Monocytes Relative: 9.9 % (ref 3.0–12.0)
Neutro Abs: 3.5 10*3/uL (ref 1.4–7.7)
Neutrophils Relative %: 56.1 % (ref 43.0–77.0)
Platelets: 181 10*3/uL (ref 150.0–400.0)
RBC: 4.02 Mil/uL — ABNORMAL LOW (ref 4.22–5.81)
RDW: 13.6 % (ref 11.5–14.6)
WBC: 6.2 10*3/uL (ref 4.5–10.5)

## 2010-04-16 LAB — BASIC METABOLIC PANEL
BUN: 13 mg/dL (ref 6–23)
CO2: 28 mEq/L (ref 19–32)
Calcium: 8.9 mg/dL (ref 8.4–10.5)
Chloride: 98 mEq/L (ref 96–112)
Creatinine, Ser: 0.9 mg/dL (ref 0.4–1.5)
GFR: 89.34 mL/min (ref >60.00)
Glucose, Bld: 109 mg/dL — ABNORMAL HIGH (ref 70–99)
Potassium: 3.6 mEq/L (ref 3.5–5.1)
Sodium: 135 mEq/L (ref 135–145)

## 2010-04-16 LAB — LIPID PANEL
Cholesterol: 127 mg/dL (ref 0–200)
HDL: 47.8 mg/dL (ref >39.00)
LDL Cholesterol: 60 mg/dL (ref 0–99)
Total CHOL/HDL Ratio: 3
Triglycerides: 94 mg/dL (ref 0.0–149.0)
VLDL: 18.8 mg/dL (ref 0.0–40.0)

## 2010-04-16 LAB — GLUCOSE, CAPILLARY: Glucose-Capillary: 112 mg/dL — ABNORMAL HIGH (ref 70–99)

## 2010-04-16 LAB — ALT: ALT: 18 U/L (ref 0–53)

## 2010-04-16 LAB — AST: AST: 22 U/L (ref 0–37)

## 2010-04-16 NOTE — Progress Notes (Signed)
Addended by: Floydene Flock on: 04/16/2010 03:14 PM   Modules accepted: Orders

## 2010-05-20 NOTE — Discharge Summary (Signed)
[name], [name]               ACCOUNT NO.:  [id number]   MEDICAL RECORD NO.:  [id number]          PATIENT TYPE:  INP   LOCATION:  1603                         FACILITY:  [facility]   PHYSICIAN:  Ollen Gross, M.D.    DATE OF BIRTH:  05-18-1926   DATE OF ADMISSION:  09/15/2006  DATE OF DISCHARGE:  09/18/2006                               DISCHARGE SUMMARY   ADMITTING DIAGNOSIS:  1. Osteoarthritis left knee.  2. Mild anxiety.  3. History of mild cerebrovascular accident.  4. Dyslipidemia.  5. Hypertension.  6. Hiatal hernia.  7. Reflux disease.  8. Hemorrhoids.  9. Osteoarthritis.  10.Diet controlled diabetes mellitus.   DISCHARGE DIAGNOSIS:  1. Osteoarthritis left knee, left total knee arthroplasty.  2. Mild postop blood loss anemia, did not require transfusion.  3. Mild postop hyponatremia, improved.  4. Mild anxiety.  5. History of mild cerebrovascular accident.  6. Dyslipidemia.  7. Hypertension.  8. Hiatal hernia.  9. Reflux disease.  10.Hemorrhoids.  11.Osteoarthritis.  12.Diet controlled diabetes mellitus.   PROCEDURE:  September 15, 2006 left total knee.  Surgeon Dr. Lequita Halt,  assistant Avel Peace PA-C. Spinal anesthesia.   CONSULTS:  None.   BRIEF HISTORY:  [name] is a 77 year old male with severe end-stage  arthritis of the left knee, progressively worsening pain and  dysfunction, who has had a successful right total knee and now presents  for left total knee.   LABORATORY DATA:  The preop CBC showed a hemoglobin of 14.6, hematocrit  of 42.2, white cell count 5.7. Postop hemoglobin 10.7, drifted down to  10.6. Last noted H&H 10.1 and 28.2.  PT/PTT preop 13.5 and 28  respectively.  INR 1.0.  Serial protimes followed. Last noted PT/INR  16.7 and 1.3. Chem panel on admission slightly elevated, total bili of  1.8, slightly elevated glucose of 148.  Serial BMETs were followed.  Glucose came down to 118 back up 165.  Sodium dropped from 137 to 133  and back  up to 134.  The remaining electrolytes remained within normal  limits.  Preop UA negative.  Blood group type A+.   EKG March 08, 2006 sinus rhythm, probable normal variant, borderline,  confirmed by Dr. Drue Novel. A two-view chest x-ray April 15, 2006, chronic  lung changes, suspicious right lower lobe nodule.  CT recommended for  further assessment. He did have a follow-up CT on April 16, 2006 which  showed a single calcified right middle lobe granuloma, no other lung  nodule, mass or infiltrate, coronary and aortic calcifications, two  subcentimeter liver lesions too small to accurately characterize.   HOSPITAL COURSE:  The patient admitted to [facility],  tolerated procedure well, later transferred to the recovery room on the  orthopedic floor,  started on PCA and p.o. analgesic for pain control  following surgery. She had a pretty good evening and was doing pretty  well on the  morning of day 1, seen in rounds, started getting up out of  bed, started back on his home medications, hemoglobin was stable. By day  two he was doing very well, dressing  changed and incision looked  excellent.  No signs of infection, very minimal swelling for a total  knee.  He started to get up with physical therapy. He actually did very  well with PT ambulating approximately 80 feet that day. He continued to  progress well and  was weaned over to p.o. meds and by day three he was  doing well with this therapy and ready to go home.   DISCHARGE PLAN:  1. The patient was discharged home on September 18, 2006.  2. Discharge diagnoses, please see above.  3. Discharge medications:  Percocet, Robaxin, Coumadin.   DIET:  Resume home diet. Low-sodium heart healthy diabetic diet.   ACTIVITY:  He is weightbearing as tolerated to the left lower extremity.  Home health PT and home health nursing, total knee protocol.   FOLLOW-UP:  2 weeks.   DISPOSITION:  Home.   CONDITION ON DISCHARGE:   Improving.      [name], P.A.C.      Ollen Gross, M.D.  Electronically Signed    ALP/MEDQ  D:  10/08/2006  T:  10/09/2006  Job:  [id number]   cc:   Willow Ora, MD  [phone] [address] [place], [place] [address]

## 2010-05-20 NOTE — Op Note (Signed)
[name], [name]               ACCOUNT NO.:  [id number]   MEDICAL RECORD NO.:  [id number]          PATIENT TYPE:  INP   LOCATION:  1603                         FACILITY:  [facility]   PHYSICIAN:  Ollen Gross, M.D.    DATE OF BIRTH:  [birth year]   DATE OF PROCEDURE:  09/15/2006  DATE OF DISCHARGE:                               OPERATIVE REPORT   PREOPERATIVE DIAGNOSIS:  Osteoarthritis left knee.   POSTOPERATIVE DIAGNOSIS:  Osteoarthritis left knee.   PROCEDURE:  Left total knee arthroplasty.   SURGEON:  Ollen Gross, M.D.   ASSISTANT:  Avel Peace PA-C   ANESTHESIA:  Spinal.   ESTIMATED BLOOD LOSS:  Minimal.   DRAIN:  None.   TOURNIQUET TIME:  33 minutes at 300 mmHg.   COMPLICATIONS:  None.   CONDITION:  Stable to recovery.   BRIEF CLINICAL NOTE:  [name] is a 77 year old male with severe end-  stage arthritis of the left knee with progressively worsening pain and  dysfunction.  He had a previous successful right total knee  arthroplasty, presents for left total knee arthroplasty.   PROCEDURE IN DETAIL:  After successful administration of spinal  anesthetic, tourniquet placed high on the left thigh and left lower  extremity prepped and draped in the usual sterile fashion.  Extremity  was wrapped in Esmarch, knee flexed, tourniquet inflated 300 mmHg.  Midline incision made with a 10 blade through subcutaneous tissue to  level of the extensor mechanism.  Fresh blade is used make a medial  parapatellar arthrotomy.  Soft tissue over the proximal medial tibia  subperiosteally elevated to the joint line with the knife into the  semimembranosus bursa with a Cobb elevator.  Soft tissue laterally is  elevated with attention being paid to avoid patellar tendon on tibial  tubercle.  Patella subluxed laterally, knee flexed 90 degrees, ACL and  PCL removed.  Drill was used create a starting hole in the distal femur  and canal was thoroughly irrigated.  5 degrees left valgus  alignment  guide is placed referencing off the posterior condyles, rotations marked  and a block pinned to remove 10 mm off the distal femur.  Distal femoral  resection is made with an oscillating saw.  Sizing blocks placed, size  2.5 is most appropriate.  Rotations marked off the epicondylar axis and  the size 2.5 cutting block placed.  The anterior-posterior chamfer cuts  were then made.   Tibia subluxed forward and menisci removed.  Extramedullary tibial  alignment guides placed referencing proximally at the medial aspect of  the tibial tubercle and distally along the second metatarsal axis and  tibial crest.  We then pinned the block to remove approximately 10 mm  off the non deficient lateral side.  Had to get down a little further  get to the base of the medial defect.  Tibial resection is made with an  oscillating saw.  Size 2.5 is most appropriate tibial component and the  proximal tibia prepared with the modular drill and keel punch for a size  2.5.  Femoral preparation is completed with the intercondylar cut.  Size 2.5 mobile bearing tibial trial, 2.5 posterior stabilized femoral  trial and 12.5 mm posterior stabilized rotating platform insert trial  are placed.  With the 12.5 full extension was achieved with excellent  varus and valgus balance throughout full range of motion.  The patella  was then everted and thickness measured to 24 mm.  Freehand resection  taken 14 mm, 38 template is placed, lug holes were drilled, trial  patella was placed and it tracks normally.  Osteophytes were removed off  the posterior femur with a trial in place.  All trials were removed and  the cut bone surfaces are prepared with pulsatile lavage.  Cement mixed  and once ready for implantation, the size 2.5 mobile bearing tibial  tray, 2.5 posterior stabilized femur and 38 patella were cemented into  place.  The patella is held with a clamp.  Trial 12.5 inserts placed,  knee held in full  extension, all extruded cement removed.  Once cement  fully hardened, then the trials removed and the wound copiously  irrigated with saline solution.  The FloSeal injected onto the posterior  capsule and the permanent 12.5 mm posterior stabilized rotating platform  insert is placed into the tibial tray.  We then injected FloSeal into  medial lateral gutters and suprapatellar area.  Moist sponge is placed  and the tourniquet released for total time of 33 minutes.  We removed  the sponge and then minimal bleeding was encountered.  Any bleeding  encountered was stopped with cautery.  The wounds again irrigated and  the extensor mechanism closed with interrupted #1 PDS.  Flexion against  gravity is 135 degrees.  Subcu was closed interrupted 2-0 Vicryl,  subcuticular running 4-0 Monocryl.  The incisions cleaned and dried and  Steri-Strips and a bulky sterile dressing applied.  He was then placed  into a knee immobilizer, awakened and transported to recovery in stable  condition.      Ollen Gross, M.D.  Electronically Signed     FA/MEDQ  D:  09/15/2006  T:  09/16/2006  Job:  [id number]

## 2010-05-20 NOTE — Assessment & Plan Note (Signed)
[facility]                         GASTROENTEROLOGY OFFICE NOTE   NAME:[name] [name] AGE                      MRN:          [id number]  DATE:08/16/2006                            DOB:          [birth year]    REASON FOR CONSULTATION:  History of colon polyps.   [name] [name] is a pleasant 77 year old [place] male referred back through the  courtesy of Dr. Drue [name] for evaluation.  He has a history of colon polyps  and was last examined in 2003.  At that time, a benign non-adenomatous  polyp was seen.  He also has a history of an esophageal stricture for  which he has undergone dilatation.  He currently denies dysphagia.  He  has no current GI complaints including abdominal pain, change of bowel  habits, melena, or hematochezia.   PAST MEDICAL HISTORY:  1. Hypertension.  2. Arthritis.  3. He is status post herniorrhaphy.  4. He underwent a total knee replacement in April 2008, and is      scheduled for his other knee in September.   FAMILY HISTORY:  Noncontributory.   MEDICATIONS:  Atenolol, Benicar, HCTZ, Prilosec, baby aspirin, and  Flomax.   He has no allergies.   He neither smokes nor drinks.  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   REVIEW OF SYSTEMS:  Positive for headaches and some loss of hearing.   PHYSICAL EXAMINATION:  VITAL SIGNS:  Pulse 80, blood pressure 142/70,  weight 132.  HEENT: EOMI. PERRLA. Sclerae are anicteric.  Conjunctivae are pink.  NECK:  Supple without thyromegaly, adenopathy or carotid bruits.  CHEST:  Clear to auscultation and percussion without adventitious  sounds.  CARDIAC:  Regular rhythm; normal S1 S2.  There are no murmurs, gallops  or rubs.  ABDOMEN:  Bowel sounds are normoactive.  Abdomen is soft, non-tender and  non-distended.  There are no abdominal masses, tenderness, splenic  enlargement or hepatomegaly.  EXTREMITIES:  Full range of motion.  No cyanosis, clubbing or edema.  RECTAL:  Deferred.   IMPRESSION:  1. History of non-adenomatous colon polyp.  2. History of esophageal stricture.   RECOMMENDATION:  Followup colonoscopy.     [name] [name]. [name] Dice, MD,FACG  Electronically Signed    RDK/MedQ  DD: 08/16/2006  DT: 08/16/2006  Job #: [id number]   cc:   [name] Ora, MD

## 2010-05-20 NOTE — Assessment & Plan Note (Signed)
[facility]                         GASTROENTEROLOGY OFFICE NOTE   NAME:Hufnagle, SPYROS WINCH                      MRN:          [id number]  DATE:10/25/2006                            DOB:          January 05, 1927    PROBLEM:  1. Colon polyps.  2. Hemorrhoids.   REASON:  Mr. Rodriques has returned following colonoscopy.  A diminutive  adenomatous polyp was removed.  His only GI complaint is protrusion of  his hemorrhoids with bowel movements.  He is able to reduce the  hemorrhoids.  He has a history of an esophageal stricture but currently  is asymptomatic.   EXAM:  Pulse 80, blood pressure 118/50, weight 130.   IMPRESSION:  1. Esophageal stricture - Asymptomatic.  2. Adenomatous polyps.  3. Internal hemorrhoids.   He has grade hemorrhoids that are not painful.  I outlined therapeutic  options including doing nothing vs. suppositories and hemorrhoidal  banding.  He will take this under advisement.   RECOMMENDATION:  Followup colonoscopy in 5 years.     Barbette Hair. Arlyce Dice, MD,FACG  Electronically Signed    RDK/MedQ  DD: 10/25/2006  DT: 10/25/2006  Job #: [id number]   cc:   Willow Ora, MD

## 2010-05-20 NOTE — H&P (Signed)
[name], [name]               ACCOUNT NO.:  [id number]   MEDICAL RECORD NO.:  [id number]          PATIENT TYPE:  INP   LOCATION:  0008                         FACILITY:  [facility]   PHYSICIAN:  Ollen Gross, M.D.    DATE OF BIRTH:  03/10/1926   DATE OF ADMISSION:  09/15/2006  DATE OF DISCHARGE:                              HISTORY & PHYSICAL   CHIEF COMPLAINT:  Left knee pain.   HISTORY OF PRESENT ILLNESS:  The patient is a 77 year old male who has  been seen by Dr. Lequita Halt for ongoing left knee pain.  He is well known  to Dr. Ollen Gross having bilateral knee pain, had undergone a  previous right total knee back in 04/08.  He has done well with his  right knee and now would like to proceed with the left side.  The risks  and benefits were discussed.  The patient was subsequently admitted to  the hospital.   ALLERGIES:  NO KNOWN DRUG ALLERGIES.   CURRENT MEDICATIONS:  Atenolol, hydrochlorothiazide, Benicar, Prilosec,  Ecotrin, aspirin, and Flomax.   PAST MEDICAL HISTORY:  Mild anxiety, history of mild CVA, hypertension,  dyslipidemia, hiatal hernia, reflux disease, hemorrhoids, diet-  controlled diabetes mellitus, and osteoarthritis.   PAST SURGICAL HISTORY:  Right inguinal hernia repair in 1990, left  inguinal repair in 1995, and right total knee replacement in 04/08.   SOCIAL HISTORY:  The patient is married, retired, a nonsmoker, very rare  intake of alcohol.  The patient has two children.  Wife and brother-in-  law will be assisting with the care after surgery.   FAMILY HISTORY:  Mother with a history of arthritis and hypertension.  Several brothers with heart disease, diabetes, and cancer including  liver cancer.   REVIEW OF SYSTEMS:  GENERAL:  No fevers, chills, night sweats.  NEUROLOGICAL:  No seizures, syncope, or paralysis.  RESPIRATORY:  No  shortness of breath, productive cough, or hemoptysis.  CARDIOVASCULAR:  No chest pain, angina, or orthopnea.  GI: No  nausea, vomiting, diarrhea,  or constipation.  GU:  No dysuria, hematuria, or discharge.  MUSCULOSKELETAL:  Left knee.   PHYSICAL EXAMINATION:  VITAL SIGNS:  Pulse 72, respirations 12, blood  pressure 130/58.  GENERAL:  The patient is a 77 year old, petite, small framed, thin,  otherwise well-developed, no acute distress, alert, oriented, and  cooperative male.  The patient is a good historian.  HEENT:  Normocephalic, atraumatic.  Pupils are round and reactive.  Oropharynx is clear.  EOMs are intact.  NECK:  Supple.  CHEST:  The chest is clear, anterior and posterior chest walls.  No  rhonchi, rales, or wheezing.  HEART:  Regular rate and rhythm.  No murmur.  S1 and S2 are noted.  ABDOMEN:  The abdomen is soft, flat, nontender.  Bowel sounds are  present.  GENITAL:  Not done, not pertinent to present illness.  EXTREMITIES:  The left knee shows a varus malalignment deformity, marked  crepitus is noted.  No instability.  Range of motion is about 5 to 120.   IMPRESSION:  1. Osteoarthritis, left knee.  2.  Mild anxiety.  3. History of mild cerebrovascular accident.  4. Dyslipidemia.  5. Hypertension.  6. Hiatal hernia.  7. Reflux disease.  8. Hemorrhoids.  9. Osteoarthritis.  10.Diet-controlled diabetes mellitus.   PLAN:  The patient was admitted to [facility] to undergo a  left total knee replacement arthroplasty.  The surgery will be performed  by Dr. Ollen Gross or Dr. Drue Novel with [place].  The patient's medical  physician is Dr. Drue Novel or [facility] hospitalist, will be notified of the  room on admission, to be consulted if needed for any medical assistance  with this patient throughout the hospital course.      Alexzandrew L. Perkins, P.A.C.      Ollen Gross, M.D.  Electronically Signed    ALP/MEDQ  D:  09/14/2006  T:  09/15/2006  Job:  [id number]   cc:   Willow Ora, MD  [phone] [place], [place] [address]   Alexzandrew L. Perkins, P.A.C.   Ollen Gross, M.D.  Fax: [phone]

## 2010-05-23 NOTE — Op Note (Signed)
NAMEJOURDAIN, GUAY               ACCOUNT NO.:  [id number]   MEDICAL RECORD NO.:  [id number]          PATIENT TYPE:  INP   LOCATION:  NA                           FACILITY:  [facility]   PHYSICIAN:  Ollen Gross, M.D.    DATE OF BIRTH:  Jan 08, 1926   DATE OF PROCEDURE:  04/21/2006  DATE OF DISCHARGE:                               OPERATIVE REPORT   PREOPERATIVE DIAGNOSIS:  Osteoarthritis right knee.   POSTOPERATIVE DIAGNOSIS:  Osteoarthritis right knee.   PROCEDURE:  Right total knee arthroplasty.   SURGEON:  Ollen Gross, M.D.   ASSISTANT:  Avel Peace PA-C   ANESTHESIA:  Spinal.   BLOOD LOSS:  Minimal.   DRAIN:  None.   TOURNIQUET TIME:  32 minutes 300 mmHg.   COMPLICATIONS:  None.   CONDITION:  Stable to recovery.   BRIEF CLINICAL NOTE:  Mr. Deemer is a 77 year old male end-stage  arthritis, right knee with progressively worsening pain and dysfunction.  He has failed nonoperative management including injection and presents  now for total knee arthroplasty.   PROCEDURE IN DETAIL:  After successful administration of spinal  anesthetic, tourniquet placed high on the right thigh and right lower  extremity prepped and draped in the usual sterile fashion.  Extremity  was wrapped in Esmarch, knee flexed, tourniquet inflated 300 mmHg.  Midline incision made with 10 blade through subcutaneous tissue to the  level of the extensor mechanism.  Fresh blade is used make a medial  parapatellar arthrotomy.  Soft tissue over the proximal medial tibia,  subperiosteally elevated to the joint line with the knife and into the  semimembranosus bursa with a Cobb elevator.  Soft tissue laterally is  elevated with attention being paid to avoid patellar tendon on tibial  tubercle.  Patella subluxed laterally, knee flexed 90 degrees, ACL and  PCL removed.  Drill was used create a starting hole in the distal femur  and the canal was thoroughly irrigated.  5 degrees right valgus  alignment guide  is placed and referencing off the posterior condyles,  rotations marked and the block pinned to remove 10 mm off the distal  femur.  Distal femoral resection is made with an oscillating saw.  Sizing blocks placed, size 2.5 is most appropriate.  Rotations marked  off the epicondylar axis.  Size 2.5 cutting blocks placed and the  anterior-posterior and chamfer cuts made.   Tibia subluxed forward and menisci removed.  Extramedullary tibial  alignment guides placed referencing proximally at the medial aspect of  tibial tubercle and distally along the second metatarsal axis and tibial  crest.  Blocks pinned to remove 10 mm off the non deficient lateral  side.  Tibial resection is made with an oscillating saw.  Size 3 is the  most appropriate tibial component and the proximal tibia is prepared  with the modular drill and keel punch for size 3.  Femoral preparation  is completed with the intercondylar cut.   Size 3 mobile bearing tibial trial, size 2.5 posterior stabilized  femoral trial, and a 10-mm posterior stabilized rotating platform insert  trial were placed.  With the 10 he had a tiny bit of varus-valgus play  so I went to a 12.5 which allowed for full extension with excellent  varus and valgus balance throughout full range of motion.  The patella  was then everted and thickness measured to be 22 mm.  Freehand resection  is taken to 13 mm, 35 template is placed, lug holes were drilled, trial  patella is placed and it tracks normally.  Osteophytes removed off the  posterior femur with the trial in place.  All trials removed and the cut  bone surfaces prepared with pulsatile lavage.  Cements mixed and once  ready for implantation, the size 3 mobile bearing tibial tray, size 2.5  posterior stabilized femur and 35 patella are cemented into place.  Patella is held with the clamp.  Trial 12.5-mm insert is placed, knee  held in full extension and all extruded cement removed.  Once the cement   is fully hardened and the joint was thoroughly irrigated, we injected  the FloSeal to the posterior capsule and placed the 12.5 mm posterior  stabilized rotating platform insert into the tibial tray.  We then  placed FloSeal knee medial and lateral gutters and suprapatellar area.  The tourniquet released for total time of 32 minutes.  There was minimal  bleeding.  The joint was then again thoroughly irrigated and the  arthrotomy closed with interrupted #1 PDS.  Flexion against gravity is  140 degrees.  Subcu is then closed with interrupted 2-0 Vicryl,  subcuticular running 4-0 Monocryl.  Incisions cleaned and dried and  Steri-Strips and a bulky sterile dressing applied.  He is then taken to  recovery in stable condition.      Ollen Gross, M.D.  Electronically Signed     FA/MEDQ  D:  04/21/2006  T:  04/21/2006  Job:  [id number]

## 2010-05-23 NOTE — H&P (Signed)
[name], [name]               ACCOUNT NO.:  [id number]   MEDICAL RECORD NO.:  [id number]          PATIENT TYPE:  OUT   LOCATION:  XRAY                         FACILITY:  [facility]   PHYSICIAN:  Ollen Gross, M.D.    DATE OF BIRTH:  05-31-1926   DATE OF ADMISSION:  04/16/2006  DATE OF DISCHARGE:  04/16/2006                              HISTORY & PHYSICAL   CHIEF COMPLAINT:  Right knee pain.   HISTORY OF PRESENT ILLNESS:  The patient is 77 year old male who has  been seen by Dr. Lequita Halt for ongoing knee problem.  He has been seen by  Dr. Lequita Halt for bilateral knee pain.  He is known to have bilateral knee  arthritis.  Unfortunately the right knee has been much more progressive  recently and is continuing to have pain and interfering with his  activity.  He has been seen and found to have significant changes on his  radiograph and felt he had reached the point where he would benefit  undergoing knee replacement.  Risks and benefits have been discussed and  he elected to proceed with surgery.   ALLERGIES:  NO KNOWN DRUG ALLERGIES.   CURRENT MEDICATIONS:  Atenolol, hydrochlorothiazide,  Benicar, Prilosec,  Ecotrin, aspirin, Prilosec p.r.n.   PAST MEDICAL HISTORY:  1. Mild anxiety.  2. Dyslipidemia.  3. Hypertension.  4. Hiatal hernia.  5. Reflux disease.  6. Hemorrhoids.  7. Diet-controlled diabetes mellitus.  8. Osteoarthritis.   PAST SURGICAL HISTORY:  1. Right inguinal hernia repair in 1990.  2. Left inguinal hernia repair in 1995.   SOCIAL HISTORY:  Married, retired, nonsmoker.  Very rare intake of  alcohol.  Two children.  Wife and brother-in-law will be assisting with  care after surgery.   FAMILY HISTORY:  Mother with history of arthritis and hypertension.  He  has several brothers with heart disease, diabetes and cancer.   REVIEW OF SYSTEMS:  GENERAL:  No fevers, chills, night sweats.  NEURO:  No seizures, syncope or paralysis.  RESPIRATORY:  No shortness of  breath, productive cough or hemoptysis.  CARDIOVASCULAR:  No chest pain,  angina or orthopnea.  GI: No nausea, vomiting,  diarrhea, or  constipation.  GU: No dysuria, hematuria or discharge.  MUSCULOSKELETAL:  Right knee.   PHYSICAL EXAMINATION:  VITAL SIGNS:  Pulse 64, respirations 12, blood  pressure 128/58.  GENERAL:  A 77 year old male, petite, small framed, thin, otherwise well-  developed in no acute distress.  He is alert, oriented and cooperative,  a good historian.  HEENT: Normocephalic, atraumatic.  Pupils are round and reactive.  Oropharynx clear.  EOMs intact.  NECK:  Supple.  CHEST:  Clear.  HEART:  Regular rate and rhythm without murmur.  ABDOMEN:  Soft, nontender.  Bowel sounds present.  RECTAL/GENITALIA:  Not done, not pertinent to present illness.  EXTREMITIES:  Right knee; right knee shows range of motion of 5-120,  marked crepitus is noted.  No instability.  Tender more medial than  lateral.   IMPRESSION:  1. Osteoarthritis right knee.  2. Mild anxiety.  3. Dyslipidemia.  4. Hypertension.  5.  Hiatal hernia.  6. Reflux disease.  7. Hemorrhoids.  8. Osteoarthritis.  9. Diet-controlled diabetes mellitus.   PLAN:  The patient was admitted to [facility] to undergo a  right total knee replacement arthroplasty.  Surgery will be performed by  Dr. Ollen Gross, Dr. Drue Novel with [facility], this patient's medical  physician.  Dr. Drue Novel or [facility] hospitalists will be notified of the room  number and be consulted if needed for medical assistance with the  patient throughout the hospital course.      [name], P.A.C.      Ollen Gross, M.D.  Electronically Signed    ALP/MEDQ  D:  04/20/2006  T:  04/21/2006  Job:  [id number]   cc:   Willow Ora, MD  [phone] [place], [place] [address]   Ollen Gross, M.D.  Fax: [phone]

## 2010-05-23 NOTE — Discharge Summary (Signed)
NAMEBENNY, [name]               ACCOUNT NO.:  [id number]   MEDICAL RECORD NO.:  [id number]          PATIENT TYPE:  INP   LOCATION:  1507                         FACILITY:  [facility]   PHYSICIAN:  Ollen Gross, M.D.    DATE OF BIRTH:  02/26/1926   DATE OF ADMISSION:  04/21/2006  DATE OF DISCHARGE:  04/25/2006                               DISCHARGE SUMMARY   ADMISSION DIAGNOSIS:  1. Osteoarthritis right knee.  2. Anxiety.  3. Hypertension.  4. Dyslipidemia.  5. Hiatal hernia.  6. Gastroesophageal reflux disease.  7. History of hemorrhoids.  8. Diet controlled diabetes mellitus.  9. Osteoarthritis.   DISCHARGE DIAGNOSIS:  1. Osteoarthritis right knee status post right total knee      arthroplasty.  2. Mild postop blood loss anemia.  3. Postop hyponatremia.  4. Dyslipidemia.  5. Hiatal hernia.  6. Gastroesophageal reflux disease.  7. History of hemorrhoids.  8. Diet controlled diabetes mellitus.  9. Osteoarthritis.  10.Anxiety.  11.Hypertension.   PROCEDURE:  04/21/2006 Right total knee surgery Dr. Lequita Halt, assistant  Alexzandrew L. Perkins, P.A.C.   CONSULTATIONS:  None.   BRIEF HISTORY:  [name] is a 77 year old male with end stage arthritis  of the right knee, progressive worsening pain and dysfunction who now  presents for total knee arthroplasty.   LABORATORY DATA:  Preop CBC shows a hemoglobin of 15.0, hematocrit 42.8,  white cell count 6.1, postop hemoglobin 11.7, drug panel 11.4, H&H 10.7  and 30.4.  PT PTT on admission 13.9 and 29 respectively, INR 1.1,  serum  pro-times PT INR 19.1 and 1.5, chem panel on admission slightly elevated  total bili of 1.5, remaining chem panel within normal limits, mildly  elevated glucose at 126.  Sodium dropped from 141 to 130, stabilized at  129, potassium did climb from 4.7 to 3.8, preop UA negative, blood type  A positive.  Preop chest x-ray 04/15/06 chronic lung changes, suspicious  for right lower lobe nodules.  CT of the  chest recommended.  Followup CT  on 04/16/06 single calcified right middle lobe granuloma no other lung  nodule, mass or infiltrate.  Coronary and aortic calcifications 2 sub  centimeter liver lesions, too small to accurately characterize.   HOSPITAL COURSE:  The patient was admitted to the hospital, tolerated  surgery well, later sent to the recovery room on the Orthopedic Floor.  Started on PCA analgesics for pain control following surgery.  Given 24  hours postop IV antibiotics, started on Coumadin for deep vein  thrombosis prophylaxis, did pretty well through the night of surgery.  The night after surgery did have a drop in the sodium so we changed some  fluids around, had a little bit of low urinary output so we gave some  general fluids for fluid challenge.  Otherwise doing pretty well on the  morning of day 1, started getting up out of bed with PT.  By day 2 the  patient was doing well, had been up with therapy and walked about 160  feet, actually doing fantastic from a therapy standpoint.  Sodium and  potassium were  low so we stopped the fluids and also gave him potassium  supplements, dressing was changed on day 2, incision looked good, had  excellent output, was diuresing fluids well at this point, continued on  day 3 to progress with physical therapy.  Incision continued to heal  well.  The patient was ready to go home by the following day of 04/25/06.   DISCHARGE PLAN:  The patient was discharged home on 04/25/06.   DISCHARGE DIAGNOSIS:  Please see above.   DISCHARGE MEDICATIONS:  Percocet, Biaxin, Coumadin, continue home meds.   DIET:  Low sodium, healthy diet.   FOLLOWUP:  Two weeks.   ACTIVITY:  Weight bearing as tolerated, right lower extremity, home  health ED, home health nursing, total knee protocol.   DISPOSITION:  Home.   CONDITION ON DISCHARGE:  Improved.      Alexzandrew L. Perkins, P.A.C.      Ollen Gross, M.D.  Electronically Signed     ALP/MEDQ  D:  06/01/2006  T:  06/01/2006  Job:  [id number]   cc:   Ollen Gross, M.D.  Fax: [phone]   Willow Ora, MD  [phone] [address] [place], [place] [address]

## 2010-05-23 NOTE — Letter (Signed)
September 03, 2005     Ollen Gross, MD  [address]  [address]  [place], [place] [address]   RE:  [name], [name]  MRN:  [id number]  /  DOB:  03-28-1926   Dear Dr. Lequita [name]:   This letter is in reference to our common patient, [name].  As  you know, he is a 77 year old gentleman who is in need of a knee  replacement.  His past medical history includes hypertension, dyslipidemia,  GERD, osteoarthritis.  He had a Cardiolite performed about a year ago and it  was negative.  At the present time he is feeling well and he denies any  chest pain, shortness of breath, nausea or diarrhea.  He admits to some  occasional slow flow of the urine at night time but he recently saw urology  and he was told he was okay.   On physical exam, he is alert, oriented and in no apparent distress.  He  weighs 132 pounds.  Pulse 70, blood pressure 120/60.  Lungs are clear to  auscultation bilaterally.  Cardiovascular:  Regular rate and rhythm without  a murmur.  There is no JVD.  Extremities:  No edema.  EKG today showed  normal sinus rhythm without any ischemic changes.  His current medications  include:   1. Atenolol 50 mg one p.o. daily.  2. Hydrochlorothiazide 25 mg one p.o. daily.  3. Prilosec 20 mg one p.o. b.i.d.  4. Aspirin.  5. Benicar 20 mg one p.o. daily.  6. He also takes multivitamin, calcium, fish oil, flaxseed oil and      glucosamine.   In short, Mr. Reisig is doing well without any evidence of heart disease or  CHF.  I think he is an acceptable candidate for the procedure you are  planning.  Today I am drawing a CBC and a BMP as well as an A1c since the  last time we checked his sugar it was slightly high, and I am planning to  send the results to you.  He also recently has been taking Celebrex for some  hand pain, but the pain is better and I asked him to stop taking the  Celebrex.  If you have any question in regard to this nice gentleman, please  do not hesitate to call  me.    Sincerely,      Willow Ora, MD   JP/MedQ  DD:  09/02/2005  DT:  09/03/2005  Job #:  [id number]

## 2010-06-12 ENCOUNTER — Encounter: Payer: Self-pay | Admitting: Internal Medicine

## 2010-06-13 ENCOUNTER — Ambulatory Visit: Payer: Medicare HMO | Admitting: Internal Medicine

## 2010-06-16 ENCOUNTER — Ambulatory Visit: Payer: Medicare HMO | Admitting: Physical Therapy

## 2010-06-23 ENCOUNTER — Ambulatory Visit: Payer: Medicare HMO | Attending: Internal Medicine | Admitting: Physical Therapy

## 2010-06-23 DIAGNOSIS — M25559 Pain in unspecified hip: Secondary | ICD-10-CM | POA: Insufficient documentation

## 2010-06-23 DIAGNOSIS — M545 Low back pain, unspecified: Secondary | ICD-10-CM | POA: Insufficient documentation

## 2010-06-23 DIAGNOSIS — IMO0001 Reserved for inherently not codable concepts without codable children: Secondary | ICD-10-CM | POA: Insufficient documentation

## 2010-06-23 DIAGNOSIS — M2569 Stiffness of other specified joint, not elsewhere classified: Secondary | ICD-10-CM | POA: Insufficient documentation

## 2010-06-23 DIAGNOSIS — Z96659 Presence of unspecified artificial knee joint: Secondary | ICD-10-CM | POA: Insufficient documentation

## 2010-06-24 ENCOUNTER — Ambulatory Visit (INDEPENDENT_AMBULATORY_CARE_PROVIDER_SITE_OTHER): Payer: Medicare HMO | Admitting: Internal Medicine

## 2010-06-24 ENCOUNTER — Encounter: Payer: Self-pay | Admitting: Internal Medicine

## 2010-06-24 VITALS — BP 120/64 | HR 89 | Temp 98.3°F | Wt 134.0 lb

## 2010-06-24 DIAGNOSIS — I10 Essential (primary) hypertension: Secondary | ICD-10-CM

## 2010-06-24 DIAGNOSIS — J40 Bronchitis, not specified as acute or chronic: Secondary | ICD-10-CM | POA: Insufficient documentation

## 2010-06-24 MED ORDER — HYDROCODONE-HOMATROPINE 5-1.5 MG/5ML PO SYRP: 5.0000 mL | ORAL_SOLUTION | Freq: Three times a day (TID) | ORAL | Status: AC | PRN

## 2010-06-24 NOTE — Patient Instructions (Addendum)
Rest, fluids , tylenol For cough, take Mucinex DM twice a day as needed  If the cough is severe, you can take hydrocodone as needed. Continue flonase nasal spray Call if no better in few days Call anytime if the symptoms are severe    schedule a nurse visit  in 3 weeks after you change to losartan for the following blood pressure check BMP--- dx HTN

## 2010-06-24 NOTE — Assessment & Plan Note (Signed)
See previous entries, has not switched from Benicar to losartan but plans to do that within a few days. See instructions

## 2010-06-24 NOTE — Assessment & Plan Note (Signed)
History consistent with bronchitis, see history of present illness, recent chest x-ray showed no pneumonia. Plan: See instructions

## 2010-06-24 NOTE — Progress Notes (Signed)
  Subjective:    Patient ID: [name], male    DOB: 17-Jul-1926, 77 y.o.   MRN: [id number]  HPI Acute visit 2 weeks ago developed cough, runny nose, he was visiting [place], went to a local doctor and was prescribed prednisone, Levaquin for a week, Flonase and Mucinex. A chest x-ray was negative, he showed me the report. He didn't improve much, went back a few days ago and was prescribed a second round of his steroids but he decided not to take it. He is here for follow up, feels slightly better. Also she had chest pain while visiting [place], had a stress test 04-03-10 : He had EKG changes that suggested ischemia, nuclear imagins show no inducible ischemia. He was told he was okay by the cardiologist. Also he reports developed muscleskeletal  pain, went to see a physical therapist, doing better  HTN--has not changed to losartan yet (see OV from 3-12)  Past Medical History  Diagnosis Date  . Allergic rhinitis   . GERD (gastroesophageal reflux disease)     w/ esoph stricture  . Diabetes mellitus   . Hyperlipidemia   . Hypertension   . BPH (benign prostatic hypertrophy)   . Osteoarthritis   . Stroke     CVA, per CT "old stroke"  . Internal hemorrhoids   . Anemia   . History of colonic polyps   . Polyneuropathy     NCS by neurology per pt 5/07  . Anxiety    Past Surgical History  Procedure Date  . Total knee arthroplasty 05/2006    (R)  . Total knee arthroplasty 08/2006    (L)  . Hernia repair     bil inguinal- per pt L in 90s, R in the 80s  . Cataract extraction 01/2009    left   . Cardiovascular stress test 02-2010    done in [place], had CP, +EKG changes, neg nuclear  imagins    Review of Systems At the present time, he is feeling slightly better, the cough has decreased and only occasionally he sees his sputum. Denies sinus congestion or pain. No shortness of breath but occasional wheezing. No fevers.    Objective:   Physical Exam  Constitutional: He appears  well-developed and well-nourished.  HENT:  Head: Normocephalic and atraumatic.  Right Ear: External ear normal.  Left Ear: External ear normal.       Face symmetric, no tenderness to palpation  Cardiovascular: Normal rate, regular rhythm and normal heart sounds.   No murmur heard. Pulmonary/Chest:       Few rhonchi with cough, otherwise clear to auscultation  Musculoskeletal: He exhibits no edema.          Assessment & Plan:  F2F > 30 min, > 50% counseling , pt gave me a detail  description of his medical care in [place], see HPI

## 2010-06-26 ENCOUNTER — Ambulatory Visit: Payer: Medicare HMO | Admitting: Physical Therapy

## 2010-06-30 ENCOUNTER — Telehealth: Payer: Self-pay | Admitting: *Deleted

## 2010-06-30 MED ORDER — OLMESARTAN MEDOXOMIL 20 MG PO TABS: 20.0000 mg | ORAL_TABLET | Freq: Every day | ORAL | Status: DC

## 2010-06-30 NOTE — Telephone Encounter (Signed)
Thank you, agree. 

## 2010-06-30 NOTE — Telephone Encounter (Signed)
Pt walked in this am, he received a phone call last night that his son is very sick. His wife and him may have to leave at anytime to go be with him. Unsure how long he will be gone. Pt does not feel comfortable starting the losartan and being away from our office. Pt prefers to take the Benicar until he is back for good. I called in 3 month supply for pt, he will call us once he is back and start the losarrtan and then come in for a BP check 2-3 weeks after he starts new med.

## 2010-07-03 ENCOUNTER — Ambulatory Visit: Payer: Medicare HMO | Admitting: Physical Therapy

## 2010-07-04 ENCOUNTER — Ambulatory Visit: Payer: Medicare HMO | Admitting: Physical Therapy

## 2010-07-08 ENCOUNTER — Ambulatory Visit: Payer: Medicare HMO | Attending: Internal Medicine | Admitting: Physical Therapy

## 2010-07-08 DIAGNOSIS — M25559 Pain in unspecified hip: Secondary | ICD-10-CM | POA: Insufficient documentation

## 2010-07-08 DIAGNOSIS — IMO0001 Reserved for inherently not codable concepts without codable children: Secondary | ICD-10-CM | POA: Insufficient documentation

## 2010-07-08 DIAGNOSIS — M2569 Stiffness of other specified joint, not elsewhere classified: Secondary | ICD-10-CM | POA: Insufficient documentation

## 2010-07-08 DIAGNOSIS — Z96659 Presence of unspecified artificial knee joint: Secondary | ICD-10-CM | POA: Insufficient documentation

## 2010-07-08 DIAGNOSIS — M545 Low back pain, unspecified: Secondary | ICD-10-CM | POA: Insufficient documentation

## 2010-07-10 ENCOUNTER — Ambulatory Visit: Payer: Medicare HMO | Admitting: Physical Therapy

## 2010-08-12 ENCOUNTER — Other Ambulatory Visit: Payer: Self-pay | Admitting: Internal Medicine

## 2010-08-12 NOTE — Telephone Encounter (Signed)
Request Denied: patient medication changed to Losartan Potassium [03/17/10]

## 2010-09-02 ENCOUNTER — Other Ambulatory Visit: Payer: Self-pay | Admitting: Internal Medicine

## 2010-09-03 NOTE — Telephone Encounter (Signed)
Rx request for Prilosec-Not on med list in EMR.?

## 2010-09-04 MED ORDER — OMEPRAZOLE 20 MG PO CPDR: 20.0000 mg | DELAYED_RELEASE_CAPSULE | Freq: Two times a day (BID) | ORAL | Status: DC

## 2010-09-04 NOTE — Telephone Encounter (Signed)
Agree, not on the medication list  But if  he has been taking Prilosec all along, ok to RF  X 1 year

## 2010-09-04 NOTE — Telephone Encounter (Signed)
Discontinued in [facility] on 03/17/10 for Losartan Potassium 50mg  ???? Rx for Omeprazole 20 mg Done.

## 2010-09-04 NOTE — Telephone Encounter (Signed)
Patient called back he said he takes generic for prilosec (omeprazole) he said he has been taking it for years

## 2010-09-11 ENCOUNTER — Other Ambulatory Visit: Payer: Self-pay | Admitting: *Deleted

## 2010-09-11 MED ORDER — OMEPRAZOLE 20 MG PO CPDR: 20.0000 mg | DELAYED_RELEASE_CAPSULE | Freq: Two times a day (BID) | ORAL | Status: DC

## 2010-10-03 ENCOUNTER — Other Ambulatory Visit: Payer: Self-pay | Admitting: Internal Medicine

## 2010-10-06 NOTE — Telephone Encounter (Signed)
Pt last seen 06/24/10, follow up in 4 months.  No appt in system.  30 day rx sent with note to schedule appt.

## 2010-10-17 LAB — CBC
HCT: 28.2 — ABNORMAL LOW
HCT: 29.6 — ABNORMAL LOW
HCT: 29.9 — ABNORMAL LOW
HCT: 42.2
Hemoglobin: 10.1 — ABNORMAL LOW
Hemoglobin: 10.6 — ABNORMAL LOW
Hemoglobin: 10.7 — ABNORMAL LOW
Hemoglobin: 14.6
MCHC: 34.5
MCHC: 35.7
MCHC: 35.9
MCHC: 35.9
MCV: 93.2
MCV: 93.7
MCV: 93.8
MCV: 94.1
Platelets: 131 — ABNORMAL LOW
Platelets: 132 — ABNORMAL LOW
Platelets: 146 — ABNORMAL LOW
Platelets: 186
RBC: 3 — ABNORMAL LOW
RBC: 3.16 — ABNORMAL LOW
RBC: 3.19 — ABNORMAL LOW
RBC: 4.53
RDW: 12.9
RDW: 13.3
RDW: 13.4
RDW: 13.8
WBC: 5.7
WBC: 6.6
WBC: 7.2
WBC: 8.1

## 2010-10-17 LAB — BASIC METABOLIC PANEL
BUN: 10
BUN: 6
CO2: 24
CO2: 26
Calcium: 7.8 — ABNORMAL LOW
Calcium: 8.2 — ABNORMAL LOW
Chloride: 102
Chloride: 102
Creatinine, Ser: 0.84
Creatinine, Ser: 0.85
GFR calc Af Amer: >60
GFR calc Af Amer: >60
GFR calc non Af Amer: >60
GFR calc non Af Amer: >60
Glucose, Bld: 118 — ABNORMAL HIGH
Glucose, Bld: 165 — ABNORMAL HIGH
Potassium: 3.6
Potassium: 3.8
Sodium: 133 — ABNORMAL LOW
Sodium: 134 — ABNORMAL LOW

## 2010-10-17 LAB — PROTIME-INR
INR: 1
INR: 1.2
INR: 1.3
INR: 1.3
Prothrombin Time: 13.5
Prothrombin Time: 15.5 — ABNORMAL HIGH
Prothrombin Time: 16.5 — ABNORMAL HIGH
Prothrombin Time: 16.7 — ABNORMAL HIGH

## 2010-10-17 LAB — COMPREHENSIVE METABOLIC PANEL
ALT: 21
AST: 28
Albumin: 4
Alkaline Phosphatase: 46
BUN: 13
CO2: 29
Calcium: 9.2
Chloride: 101
Creatinine, Ser: 0.86
GFR calc Af Amer: >60
GFR calc non Af Amer: >60
Glucose, Bld: 148 — ABNORMAL HIGH
Potassium: 3.5
Sodium: 137
Total Bilirubin: 1.8 — ABNORMAL HIGH
Total Protein: 7.3

## 2010-10-17 LAB — URINALYSIS, ROUTINE W REFLEX MICROSCOPIC
Bilirubin Urine: NEGATIVE
Glucose, UA: NEGATIVE
Hgb urine dipstick: NEGATIVE
Ketones, ur: NEGATIVE
Nitrite: NEGATIVE
Protein, ur: NEGATIVE
Specific Gravity, Urine: 1.018
Urobilinogen, UA: 0.2
pH: 6.5

## 2010-10-17 LAB — TYPE AND SCREEN
ABO/RH(D): A POS
Antibody Screen: NEGATIVE

## 2010-10-17 LAB — APTT: aPTT: 28

## 2010-12-12 ENCOUNTER — Ambulatory Visit (INDEPENDENT_AMBULATORY_CARE_PROVIDER_SITE_OTHER): Payer: Medicare HMO | Admitting: Internal Medicine

## 2010-12-12 VITALS — BP 120/58 | HR 69 | Temp 97.5°F | Ht 65.0 in | Wt 140.0 lb

## 2010-12-12 DIAGNOSIS — R42 Dizziness and giddiness: Secondary | ICD-10-CM

## 2010-12-12 DIAGNOSIS — I10 Essential (primary) hypertension: Secondary | ICD-10-CM

## 2010-12-12 DIAGNOSIS — J309 Allergic rhinitis, unspecified: Secondary | ICD-10-CM

## 2010-12-12 MED ORDER — FLUTICASONE PROPIONATE 50 MCG/ACT NA SUSP: 2.0000 | Freq: Every day | NASAL | Status: DC

## 2010-12-12 NOTE — Assessment & Plan Note (Signed)
Sx x 5 days, not usin flonase rec to restart flonase

## 2010-12-12 NOTE — Assessment & Plan Note (Signed)
Mild dizzines, doubt related to DBP rec observation

## 2010-12-12 NOTE — Patient Instructions (Signed)
Return in 4 months for a physical exam, fasting

## 2010-12-12 NOTE — Progress Notes (Signed)
  Subjective:    Patient ID: [name], male    DOB: 1926-06-01, 77 y.o.   MRN: [id number]  HPI 4-5 days history of allergies: Itchy eyes, itchy nose. Has not been using his Flonase. Hypertension--good medication compliance, concerned because his DBPs is slightly low, 58 today, at home is also in the 50s. Systolic blood pressure normal. Wonders if that is related to the occasional dizziness he experienced. On chart review, he has complained of dizziness on and off, this time reports in the symptoms are mild, usually when he stands up quickly or moves his head. High cholesterol--good medication compliance  Past Medical History: Allergic rhinitis GERD w/ esoph stricture Borderline DM A1C 5.7 02-2008 HYPERLIPIDEMIA Hypertension Benign prostatic hypertrophy Osteoarthritis h/o CVA, per CT "old stroke" INTERNAL HEMORRHOIDS   h/o  ANEMIA NOS  COLONIC POLYPS   DEXA normal 10-07 NCS by neurology per pt 5-07: polyneuropathy, ?diabetic 6-06 (-) cardiolite anxiety  Past Surgical History: Total knee replacement, 05-2006 (R) Total knee replacement, 08-2006 (L) BIL. Inguinal hernia repair (per patient L in the 90s, R in the 80s) Cataract extraction (01/2009) left    Review of Systems No fever or chills No nausea, vomiting, diarrhea or myalgias Denies a slurred speech, facial weaknesses or diplopia. No recent falls.     Objective:   Physical Exam  Constitutional: He appears well-developed and well-nourished. No distress.  HENT:  Head: Normocephalic and atraumatic.       Face symmetric, nontender to palpation. Nose is slightly congested  Eyes: Pupils are equal, round, and reactive to light.  Cardiovascular: Normal rate, regular rhythm and normal heart sounds.   No murmur heard. Pulmonary/Chest: Effort normal and breath sounds normal. No respiratory distress. He has no wheezes.  Musculoskeletal: He exhibits no edema.  Neurological:       Motor intact, speech fluent, gait normal    Skin: He is not diaphoretic.  Psychiatric: He has a normal mood and affect. His behavior is normal. Judgment and thought content normal.      Assessment & Plan:

## 2010-12-12 NOTE — Assessment & Plan Note (Signed)
Due for labs  He asked to switch to losartan but still on benicar

## 2010-12-13 LAB — BASIC METABOLIC PANEL
BUN: 19 mg/dL (ref 6–23)
CO2: 24 mEq/L (ref 19–32)
Calcium: 8.8 mg/dL (ref 8.4–10.5)
Chloride: 101 mEq/L (ref 96–112)
Creat: 0.83 mg/dL (ref 0.50–1.35)
Glucose, Bld: 185 mg/dL — ABNORMAL HIGH (ref 70–99)
Potassium: 3.9 mEq/L (ref 3.5–5.3)
Sodium: 138 mEq/L (ref 135–145)

## 2010-12-17 ENCOUNTER — Other Ambulatory Visit: Payer: Self-pay | Admitting: Internal Medicine

## 2010-12-17 MED ORDER — TAMSULOSIN HCL 0.4 MG PO CAPS: ORAL_CAPSULE | ORAL | Status: DC

## 2010-12-17 MED ORDER — AMLODIPINE BESYLATE 5 MG PO TABS: ORAL_TABLET | ORAL | Status: DC

## 2010-12-17 MED ORDER — ATENOLOL 50 MG PO TABS: 25.0000 mg | ORAL_TABLET | Freq: Every day | ORAL | Status: DC

## 2010-12-17 MED ORDER — OLMESARTAN MEDOXOMIL 20 MG PO TABS: 20.0000 mg | ORAL_TABLET | Freq: Every day | ORAL | Status: DC

## 2010-12-17 MED ORDER — HYDROCHLOROTHIAZIDE 25 MG PO TABS: 12.5000 mg | ORAL_TABLET | Freq: Every day | ORAL | Status: DC

## 2010-12-17 NOTE — Telephone Encounter (Signed)
Rx sent 

## 2010-12-19 ENCOUNTER — Telehealth: Payer: Self-pay | Admitting: Internal Medicine

## 2010-12-19 MED ORDER — AZELASTINE HCL 0.1 % NA SOLN: 2.0000 | Freq: Every day | NASAL | Status: DC

## 2010-12-19 NOTE — Telephone Encounter (Signed)
Patient was seen last week -using nasal spray - stuffy noise is better - but still has watery - itchy eyes - [facility] [address]

## 2010-12-19 NOTE — Telephone Encounter (Signed)
Addendum: If he has  fever, chills, cough ---> needs  office visit

## 2010-12-19 NOTE — Telephone Encounter (Signed)
Claritin 10 mg over-the-counter one daily. Also add astepro 2 sprays on each side of the nose at bedtime, call #1 and 3 refills.

## 2010-12-19 NOTE — Telephone Encounter (Signed)
Discuss with patient  

## 2010-12-19 NOTE — Telephone Encounter (Signed)
Left message to call office

## 2011-02-19 ENCOUNTER — Other Ambulatory Visit: Payer: Self-pay | Admitting: *Deleted

## 2011-02-19 MED ORDER — OMEPRAZOLE 20 MG PO CPDR: 20.0000 mg | DELAYED_RELEASE_CAPSULE | Freq: Two times a day (BID) | ORAL | Status: DC

## 2011-02-19 MED ORDER — OLMESARTAN MEDOXOMIL 20 MG PO TABS: 20.0000 mg | ORAL_TABLET | Freq: Every day | ORAL | Status: DC

## 2011-02-19 MED ORDER — TAMSULOSIN HCL 0.4 MG PO CAPS: ORAL_CAPSULE | ORAL | Status: DC

## 2011-02-19 MED ORDER — ATENOLOL 50 MG PO TABS: 25.0000 mg | ORAL_TABLET | Freq: Every day | ORAL | Status: DC

## 2011-02-19 MED ORDER — AMLODIPINE BESYLATE 5 MG PO TABS: ORAL_TABLET | ORAL | Status: DC

## 2011-02-23 MED ORDER — ATENOLOL 50 MG PO TABS: 25.0000 mg | ORAL_TABLET | Freq: Every day | ORAL | Status: DC

## 2011-02-23 MED ORDER — OMEPRAZOLE 20 MG PO CPDR: 20.0000 mg | DELAYED_RELEASE_CAPSULE | Freq: Two times a day (BID) | ORAL | Status: DC

## 2011-02-23 MED ORDER — TAMSULOSIN HCL 0.4 MG PO CAPS: ORAL_CAPSULE | ORAL | Status: DC

## 2011-02-23 MED ORDER — OLMESARTAN MEDOXOMIL 20 MG PO TABS: 20.0000 mg | ORAL_TABLET | Freq: Every day | ORAL | Status: DC

## 2011-02-23 MED ORDER — AMLODIPINE BESYLATE 5 MG PO TABS: ORAL_TABLET | ORAL | Status: DC

## 2011-02-23 NOTE — Progress Notes (Signed)
Addended by: Candie Echevaria L on: 02/23/2011 12:17 PM   Modules accepted: Orders

## 2011-03-02 ENCOUNTER — Telehealth: Payer: Self-pay | Admitting: *Deleted

## 2011-03-02 MED ORDER — SIMVASTATIN 20 MG PO TABS: 10.0000 mg | ORAL_TABLET | Freq: Every day | ORAL | Status: DC

## 2011-03-02 NOTE — Telephone Encounter (Signed)
Notified pt he is suppose to take 1 tablet daily.

## 2011-03-02 NOTE — Telephone Encounter (Signed)
Our records indicate that he is supposed to take Benicar 20 mg one tablet daily

## 2011-03-02 NOTE — Telephone Encounter (Signed)
Pt states that he was suppose to take 2 tab of the benicar 20 mg however the chart indicated only 1 tab daily please clarify how med is suppose to be taken.

## 2011-03-04 ENCOUNTER — Telehealth: Payer: Self-pay

## 2011-03-04 MED ORDER — OMEPRAZOLE 20 MG PO CPDR: 20.0000 mg | DELAYED_RELEASE_CAPSULE | Freq: Two times a day (BID) | ORAL | Status: DC

## 2011-03-04 NOTE — Telephone Encounter (Signed)
Pt aware and rx sent to pharmacy. 

## 2011-03-04 NOTE — Telephone Encounter (Signed)
Okay to increase the medicine again

## 2011-03-04 NOTE — Telephone Encounter (Signed)
Pt states that he has been taking Prilosec BID for quite some time. Reports that his PCP, Dr. Drue Novel,  has changed it to one a day. Pt would like for Dr. Arlyce Dice to change his rx so that he may again take the medication BID. Dr. Arlyce Dice please advise.

## 2011-03-06 ENCOUNTER — Encounter: Payer: Self-pay | Admitting: Family Medicine

## 2011-03-06 ENCOUNTER — Ambulatory Visit (INDEPENDENT_AMBULATORY_CARE_PROVIDER_SITE_OTHER): Payer: Medicare Other | Admitting: Family Medicine

## 2011-03-06 VITALS — BP 105/65 | HR 62 | Temp 97.2°F | Ht 65.0 in | Wt 137.8 lb

## 2011-03-06 DIAGNOSIS — J309 Allergic rhinitis, unspecified: Secondary | ICD-10-CM

## 2011-03-06 MED ORDER — LORATADINE 10 MG PO TABS: 10.0000 mg | ORAL_TABLET | Freq: Every day | ORAL | Status: DC

## 2011-03-06 NOTE — Patient Instructions (Signed)
This all appears to be allergy related Restart the nasonex daily- 2 sprays in each nostril Start the Claritin daily Drink plenty of fluids Call with any questions or concerns Have a safe trip!

## 2011-03-06 NOTE — Progress Notes (Signed)
  Subjective:    Patient ID: [name], male    DOB: [birth year], 78 y.o.   MRN: [id number]  HPI Allergy sxs- 'very bad sneezing for the last 3-4 days.  Today i'm having a stuffy nose'  Denise body aches.  + burning eyes.  Not currently taking any allergy medications.  No fevers.  No ear pain.  + dry cough.  Hx of seasonal allergies.  Previously on nasonex and astepro- 'nasonex works better'.   Review of Systems For ROS see HPI     Objective:   Physical Exam  Vitals reviewed. Constitutional: He appears well-developed and well-nourished. No distress.  HENT:  Head: Normocephalic and atraumatic.       No TTP over sinuses + turbinate edema + PND TMs normal bilaterally  Eyes: Conjunctivae and EOM are normal. Pupils are equal, round, and reactive to light.  Neck: Normal range of motion. Neck supple.  Cardiovascular: Normal rate, regular rhythm and normal heart sounds.   Pulmonary/Chest: Effort normal and breath sounds normal. No respiratory distress. He has no wheezes.  Lymphadenopathy:    He has no cervical adenopathy.  Skin: Skin is warm and dry.          Assessment & Plan:

## 2011-03-06 NOTE — Assessment & Plan Note (Signed)
Deteriorated.  Pt w/ hx of similar but not currently taking meds.  Restart Claritin and nasonex.  Reviewed supportive care and red flags that should prompt return.  Pt expressed understanding and is in agreement w/ plan.

## 2011-03-09 ENCOUNTER — Ambulatory Visit: Payer: Medicare HMO | Admitting: Internal Medicine

## 2011-03-19 ENCOUNTER — Encounter: Payer: Medicare HMO | Admitting: Internal Medicine

## 2011-04-22 ENCOUNTER — Encounter: Payer: Medicare HMO | Admitting: Internal Medicine

## 2011-05-08 ENCOUNTER — Ambulatory Visit (INDEPENDENT_AMBULATORY_CARE_PROVIDER_SITE_OTHER): Payer: Medicare Other | Admitting: Internal Medicine

## 2011-05-08 VITALS — BP 130/60 | HR 67 | Temp 97.5°F | Ht 65.0 in | Wt 134.0 lb

## 2011-05-08 DIAGNOSIS — R269 Unspecified abnormalities of gait and mobility: Secondary | ICD-10-CM

## 2011-05-08 DIAGNOSIS — E119 Type 2 diabetes mellitus without complications: Secondary | ICD-10-CM

## 2011-05-08 DIAGNOSIS — N400 Enlarged prostate without lower urinary tract symptoms: Secondary | ICD-10-CM

## 2011-05-08 DIAGNOSIS — Z0000 Encounter for general adult medical examination without abnormal findings: Secondary | ICD-10-CM

## 2011-05-08 DIAGNOSIS — E785 Hyperlipidemia, unspecified: Secondary | ICD-10-CM

## 2011-05-08 DIAGNOSIS — I10 Essential (primary) hypertension: Secondary | ICD-10-CM

## 2011-05-08 MED ORDER — AMLODIPINE BESYLATE 5 MG PO TABS: 2.5000 mg | ORAL_TABLET | Freq: Every day | ORAL | Status: DC

## 2011-05-08 MED ORDER — HYDROCHLOROTHIAZIDE 25 MG PO TABS: 12.5000 mg | ORAL_TABLET | Freq: Every day | ORAL | Status: DC

## 2011-05-08 MED ORDER — OLMESARTAN MEDOXOMIL 20 MG PO TABS: 20.0000 mg | ORAL_TABLET | Freq: Every day | ORAL | Status: DC

## 2011-05-08 MED ORDER — OMEPRAZOLE 20 MG PO CPDR: 20.0000 mg | DELAYED_RELEASE_CAPSULE | Freq: Two times a day (BID) | ORAL | Status: DC

## 2011-05-08 MED ORDER — ATENOLOL 50 MG PO TABS: 25.0000 mg | ORAL_TABLET | Freq: Every day | ORAL | Status: DC

## 2011-05-08 MED ORDER — TAMSULOSIN HCL 0.4 MG PO CAPS: ORAL_CAPSULE | ORAL | Status: DC

## 2011-05-08 MED ORDER — SIMVASTATIN 20 MG PO TABS: 10.0000 mg | ORAL_TABLET | Freq: Every day | ORAL | Status: DC

## 2011-05-08 NOTE — Assessment & Plan Note (Signed)
Previously A1c where in the mids 5s. Labs

## 2011-05-08 NOTE — Patient Instructions (Addendum)
Take calcium and vitamin D every day Decrease amlodipine to half tablet daily Check the  blood pressure 2 or 3 times a week, be sure it is between 120/65 and 140/85. If it is consistently higher or lower , let me know -------- Please come back fasting for labs  BMP, CBC ---- dx  hypertension FLP, AST, ALT---- dx  high cholesterol

## 2011-05-08 NOTE — Progress Notes (Signed)
  Subjective:    Patient ID: [name], male    DOB: 02/07/1926, 78 y.o.   MRN: [id number]  HPI Here for Medicare AWV: 1.Risk factors based on Past M, S, F history: reviewed  2. physical Activities: walks less than before d/t decreased balance problems, active w/ home             chores   3. depression/mood: recently  lost a son (59 y/o) , wife had a Fx ; pt counseled today,  denies need for meds, no suicidal  4. Hearing: poor hearing , uses hearing aids  5. ADL's: independent , still drives  6.Fall Risk: no recent falls , some balance issues , feels unsecured in his feet, no dizziness;             counseled about fall prevention, offered PT referral------------[place] 7. Home Safety: does feel safe at home  8. height, weight, &visual acuity: see VS,  vision good, had B cataract done  9. Counseling: yes  10. Labs ordered based on risk factors: yes  11. Referral Coordination, if needed  12. Care Plan, see a/p 13. Cognitive Assessment: memory and cognition wnl, motor skills normal, just limited by DJD  in addition we discissed the following: Hypertension, good medication compliance, BP at times is as low as 115/55 and he feels slightly lightheaded when he stands up. High cholesterol, good medication compliance, he is not fasting today. Allergies, have not been an issue up until the last few days.  Past Medical History:  Allergic rhinitis  GERD w/ esoph stricture  Borderline DM A1C 5.7 02-2008  HYPERLIPIDEMIA  Hypertension  Benign prostatic hypertrophy  Osteoarthritis  h/o CVA, per CT "old stroke"  INTERNAL HEMORRHOIDS  h/o ANEMIA NOS  COLONIC POLYPS  DEXA normal 10-07  NCS by neurology per pt 5-07: polyneuropathy, ?diabetic  6-06 (-) cardiolite  anxiety  Past Surgical History:  Total knee replacement, 05-2006 (R)  Total knee replacement, 08-2006 (L)  BIL. Inguinal hernia repair (per patient L in the 90s, R in the 80s)  Cataract extraction (01/2009)  left  Family History: DM-- B MI-- B age around 19, another B with heart problems dx 50 colon ca--no prostate ca--no liver Ca-- B  Social History: Retired, Married, original from [place] has two children, lost a son in  [place] and one in [place] Patient has never smoked.  Alcohol Use - no   Review of Systems No chest pain, shortness or breath or palpitations. No nausea, vomiting, diarrhea.     Objective:   Physical Exam General -- alert, well-developed, and not overweight appearing. No apparent distress.  Neck --no LADs, normal carotid pulses Lungs -- normal respiratory effort, no intercostal retractions, no accessory muscle use, and normal breath sounds.   Heart-- normal rate, regular rhythm, no murmur, and no gallop.   Abdomen--soft, non-tender, no distention, no masses, no HSM, no guarding, and no rigidity.  No bruit Extremities-- no pretibial edema bilaterally  Neurologic-- alert & oriented X3 and strength normal in all extremities. Psych-- Cognition and judgment appear intact. Alert and cooperative with normal attention span and concentration.  not anxious appearing and not depressed appearing.         Assessment & Plan:

## 2011-05-08 NOTE — Assessment & Plan Note (Signed)
Over controlled? Occasionally diastolic blood pressure control in the 50s and he feels slightly lightheaded when he stands up. Plan: Decrease amlodipine to half tablet daily.

## 2011-05-08 NOTE — Assessment & Plan Note (Signed)
Due for labs

## 2011-05-08 NOTE — Assessment & Plan Note (Signed)
Per u rology 

## 2011-05-10 ENCOUNTER — Encounter: Payer: Self-pay | Admitting: Internal Medicine

## 2011-05-10 DIAGNOSIS — Z0000 Encounter for general adult medical examination without abnormal findings: Secondary | ICD-10-CM | POA: Insufficient documentation

## 2011-05-10 NOTE — Assessment & Plan Note (Signed)
T d 2000 and 3-12 Pneumovax 2006  shingles shot Rx provided, never used, benefits discussed again  Colonoscopy 08/20/2006 : adenomatous polyp, Dr Arlyce Dice,  next per GI   Bone Density: 10/21/2005 and  11-09, both normal- neg ; rec ca and vit D   Last OV w/ urology ~ 6 months ago, pt reports they stopped checking PSAs diet, exercise discussed  Refer to PT

## 2011-05-11 ENCOUNTER — Other Ambulatory Visit (INDEPENDENT_AMBULATORY_CARE_PROVIDER_SITE_OTHER): Payer: Medicare Other

## 2011-05-11 DIAGNOSIS — E7800 Pure hypercholesterolemia, unspecified: Secondary | ICD-10-CM

## 2011-05-11 DIAGNOSIS — I10 Essential (primary) hypertension: Secondary | ICD-10-CM

## 2011-05-11 LAB — BASIC METABOLIC PANEL
BUN: 10 mg/dL (ref 6–23)
CO2: 28 mEq/L (ref 19–32)
Calcium: 9.3 mg/dL (ref 8.4–10.5)
Chloride: 99 mEq/L (ref 96–112)
Creatinine, Ser: 0.8 mg/dL (ref 0.4–1.5)
GFR: 102.58 mL/min (ref >60.00)
Glucose, Bld: 95 mg/dL (ref 70–99)
Potassium: 3.7 mEq/L (ref 3.5–5.1)
Sodium: 138 mEq/L (ref 135–145)

## 2011-05-11 LAB — AST: AST: 22 U/L (ref 0–37)

## 2011-05-11 LAB — CBC WITH DIFFERENTIAL/PLATELET
Basophils Absolute: 0 10*3/uL (ref 0.0–0.1)
Basophils Relative: 0.7 % (ref 0.0–3.0)
Eosinophils Absolute: 0.4 10*3/uL (ref 0.0–0.7)
Eosinophils Relative: 6.1 % — ABNORMAL HIGH (ref 0.0–5.0)
HCT: 41 % (ref 39.0–52.0)
Hemoglobin: 14 g/dL (ref 13.0–17.0)
Lymphocytes Relative: 32.7 % (ref 12.0–46.0)
Lymphs Abs: 2 10*3/uL (ref 0.7–4.0)
MCHC: 34.1 g/dL (ref 30.0–36.0)
MCV: 91.5 fl (ref 78.0–100.0)
Monocytes Absolute: 0.6 10*3/uL (ref 0.1–1.0)
Monocytes Relative: 9.1 % (ref 3.0–12.0)
Neutro Abs: 3.1 10*3/uL (ref 1.4–7.7)
Neutrophils Relative %: 51.4 % (ref 43.0–77.0)
Platelets: 180 10*3/uL (ref 150.0–400.0)
RBC: 4.48 Mil/uL (ref 4.22–5.81)
RDW: 13.8 % (ref 11.5–14.6)
WBC: 6.1 10*3/uL (ref 4.5–10.5)

## 2011-05-11 LAB — ALT: ALT: 17 U/L (ref 0–53)

## 2011-05-11 LAB — LIPID PANEL
Cholesterol: 120 mg/dL (ref 0–200)
HDL: 46.6 mg/dL (ref >39.00)
LDL Cholesterol: 50 mg/dL (ref 0–99)
Total CHOL/HDL Ratio: 3
Triglycerides: 115 mg/dL (ref 0.0–149.0)
VLDL: 23 mg/dL (ref 0.0–40.0)

## 2011-05-11 NOTE — Progress Notes (Signed)
Lab only 

## 2011-05-12 ENCOUNTER — Encounter: Payer: Self-pay | Admitting: *Deleted

## 2011-05-25 ENCOUNTER — Telehealth: Payer: Self-pay | Admitting: Internal Medicine

## 2011-05-25 ENCOUNTER — Ambulatory Visit: Payer: Medicare Other | Attending: Internal Medicine | Admitting: Physical Therapy

## 2011-05-25 DIAGNOSIS — M542 Cervicalgia: Secondary | ICD-10-CM | POA: Insufficient documentation

## 2011-05-25 DIAGNOSIS — IMO0001 Reserved for inherently not codable concepts without codable children: Secondary | ICD-10-CM | POA: Insufficient documentation

## 2011-05-25 DIAGNOSIS — R262 Difficulty in walking, not elsewhere classified: Secondary | ICD-10-CM | POA: Insufficient documentation

## 2011-05-25 MED ORDER — TAMSULOSIN HCL 0.4 MG PO CAPS: 0.4000 mg | ORAL_CAPSULE | Freq: Every day | ORAL | Status: DC

## 2011-05-25 NOTE — Telephone Encounter (Signed)
Last available urology note was from last year, he was prescribed rapaflu. Needs to call urology for urology meds

## 2011-05-25 NOTE — Telephone Encounter (Signed)
Discussed with pt & he states his urologist changed his flomax to 1-per day. Sent in new rx to pharmacy.

## 2011-05-25 NOTE — Telephone Encounter (Signed)
Notified pt. 

## 2011-05-25 NOTE — Telephone Encounter (Signed)
Please clarify med sig both direction are on med list.

## 2011-05-25 NOTE — Telephone Encounter (Signed)
patient called and stated he is suppose to be on 1-per day of flowmax per his urologist. Please call patient at [phone]

## 2011-06-03 ENCOUNTER — Ambulatory Visit: Payer: Medicare Other | Admitting: Physical Therapy

## 2011-07-06 ENCOUNTER — Other Ambulatory Visit: Payer: Self-pay | Admitting: Internal Medicine

## 2011-07-06 NOTE — Telephone Encounter (Signed)
Refill done.  

## 2011-07-08 ENCOUNTER — Ambulatory Visit: Payer: Medicare Other | Admitting: Internal Medicine

## 2011-08-05 ENCOUNTER — Telehealth: Payer: Self-pay | Admitting: Internal Medicine

## 2011-08-05 NOTE — Telephone Encounter (Signed)
Spoke with Pt who indicated that he has already spoken with someone who clarified how he is to take med. Pt verbalized that he understood instructions and has a pending OV with Dr Drue Novel.

## 2011-08-05 NOTE — Telephone Encounter (Signed)
Caller: Zayvien/Patient is calling with a question about Amlodipine 5MG .The medication was written by Willow Ora. Pt ? his dose of Amlodipine. Per EPIC: Amlodopine 2.5mg  (1/2 tab) q d.

## 2011-08-07 ENCOUNTER — Ambulatory Visit (INDEPENDENT_AMBULATORY_CARE_PROVIDER_SITE_OTHER): Payer: Medicare Other | Admitting: Internal Medicine

## 2011-08-07 ENCOUNTER — Encounter: Payer: Self-pay | Admitting: Internal Medicine

## 2011-08-07 VITALS — BP 128/66 | HR 78 | Temp 97.6°F | Wt 133.4 lb

## 2011-08-07 DIAGNOSIS — I10 Essential (primary) hypertension: Secondary | ICD-10-CM

## 2011-08-07 DIAGNOSIS — N400 Enlarged prostate without lower urinary tract symptoms: Secondary | ICD-10-CM

## 2011-08-07 MED ORDER — ATENOLOL 50 MG PO TABS: 25.0000 mg | ORAL_TABLET | Freq: Every day | ORAL | Status: DC

## 2011-08-07 MED ORDER — OLMESARTAN MEDOXOMIL 20 MG PO TABS: 20.0000 mg | ORAL_TABLET | Freq: Every day | ORAL | Status: DC

## 2011-08-07 MED ORDER — HYDROCHLOROTHIAZIDE 25 MG PO TABS: 12.5000 mg | ORAL_TABLET | Freq: Every day | ORAL | Status: DC

## 2011-08-07 MED ORDER — AMLODIPINE BESYLATE 5 MG PO TABS: 2.5000 mg | ORAL_TABLET | Freq: Every day | ORAL | Status: DC

## 2011-08-07 MED ORDER — SIMVASTATIN 20 MG PO TABS: 10.0000 mg | ORAL_TABLET | Freq: Every day | ORAL | Status: DC

## 2011-08-07 MED ORDER — OMEPRAZOLE 20 MG PO CPDR: 20.0000 mg | DELAYED_RELEASE_CAPSULE | Freq: Two times a day (BID) | ORAL | Status: DC

## 2011-08-07 NOTE — Progress Notes (Signed)
  Subjective:    Patient ID: [name], male    DOB: 03-24-1926, 78 y.o.   MRN: [id number]  HPI Followup from previous visit. At the last visit, he reported low diastolic BPs, Norvasc was reduced from 5 mg to 2.5 mg. Ambulatory BPs continue to be great, around 120/60-70 In general he feels better. Also he went to urology, Flomax was discontinued and since then he is feeling less lightheaded. Needs "all medications refilled"   Past Medical History:   Allergic rhinitis   GERD w/ esoph stricture   Borderline DM A1C 5.7 02-2008   HYPERLIPIDEMIA   Hypertension   Benign prostatic hypertrophy   Osteoarthritis   h/o CVA, per CT "old stroke"   INTERNAL HEMORRHOIDS   h/o ANEMIA NOS   COLONIC POLYPS   DEXA normal 10-07   NCS by neurology per pt 5-07: polyneuropathy, ?diabetic   6-06 (-) cardiolite   anxiety    Past Surgical History:   Total knee replacement, 05-2006 (R)   Total knee replacement, 08-2006 (L)   BIL. Inguinal hernia repair (per patient L in the 90s, R in the 80s)   Cataract extraction (01/2009) left  Family History: DM-- B MI-- B age around 25, another B with heart problems dx 63 colon ca--no prostate ca--no liver Ca-- B  Social History: Retired, Married, original from [place] has two children, lost a son in  [place] and one in [place] Patient has never smoked.   Alcohol Use - no   Review of Systems No chest pain or shortness or breath No nausea, vomiting, diarrhea    Objective:   Physical Exam  General -- alert, well-developed, and well-nourished.   Lungs -- normal respiratory effort, no intercostal retractions, no accessory muscle use, and normal breath sounds.   Heart-- normal rate, regular rhythm, no murmur, and no gallop.   Extremities-- no pretibial edema bilaterally Psych-- Cognition and judgment appear intact. Alert and cooperative with normal attention span and concentration.  not anxious appearing and not depressed appearing.         Assessment & Plan:

## 2011-08-07 NOTE — Assessment & Plan Note (Signed)
Saw urology, Flomax discontinue, lightheadedness decreased. Currently on finasteride and rapaflo

## 2011-08-07 NOTE — Patient Instructions (Addendum)
Continue with his same medications, next followup in 4-6 months.

## 2011-08-07 NOTE — Assessment & Plan Note (Addendum)
Doing great with a reduced dose of amlodipine, continue with: Amlodipine 5 mg half tablet Hydrochlorothiazide 25 1/2 tablet Benicar 20 mg one tablet Atenolol 50 1/2 tablet  Samples of Benicar provided. Prescriptions refilled. At some point will simplify his regimen however changing his medications is a challenge and a source of confusion, currently managing medicines well.

## 2011-10-21 ENCOUNTER — Other Ambulatory Visit: Payer: Self-pay | Admitting: Internal Medicine

## 2011-10-21 MED ORDER — OLMESARTAN MEDOXOMIL 20 MG PO TABS: 20.0000 mg | ORAL_TABLET | Freq: Every day | ORAL | Status: DC

## 2011-10-21 NOTE — Telephone Encounter (Signed)
BENICAR tablet 20mg   Qty:90 Last fill: 07/16/11 Take 1 tablet by mouth daily

## 2011-10-21 NOTE — Telephone Encounter (Signed)
Refill done.  

## 2011-10-22 NOTE — Telephone Encounter (Signed)
OV 08/07/11. Last filled not able to see showing historical med.  PLz advise      MW

## 2011-10-22 NOTE — Telephone Encounter (Signed)
Refill done.  

## 2012-01-19 DIAGNOSIS — M79609 Pain in unspecified limb: Secondary | ICD-10-CM | POA: Diagnosis not present

## 2012-01-19 DIAGNOSIS — B351 Tinea unguium: Secondary | ICD-10-CM | POA: Diagnosis not present

## 2012-01-21 DIAGNOSIS — N529 Male erectile dysfunction, unspecified: Secondary | ICD-10-CM | POA: Diagnosis not present

## 2012-01-21 DIAGNOSIS — R351 Nocturia: Secondary | ICD-10-CM | POA: Diagnosis not present

## 2012-01-21 DIAGNOSIS — N400 Enlarged prostate without lower urinary tract symptoms: Secondary | ICD-10-CM | POA: Diagnosis not present

## 2012-01-22 ENCOUNTER — Ambulatory Visit (INDEPENDENT_AMBULATORY_CARE_PROVIDER_SITE_OTHER): Payer: Medicare Other | Admitting: Internal Medicine

## 2012-01-22 VITALS — BP 132/68 | HR 65 | Temp 97.4°F | Wt 138.0 lb

## 2012-01-22 DIAGNOSIS — I10 Essential (primary) hypertension: Secondary | ICD-10-CM | POA: Diagnosis not present

## 2012-01-22 LAB — BASIC METABOLIC PANEL
BUN: 10 mg/dL (ref 6–23)
CO2: 24 mEq/L (ref 19–32)
Calcium: 9 mg/dL (ref 8.4–10.5)
Chloride: 99 mEq/L (ref 96–112)
Creat: 0.79 mg/dL (ref 0.50–1.35)
Glucose, Bld: 150 mg/dL — ABNORMAL HIGH (ref 70–99)
Potassium: 3.8 mEq/L (ref 3.5–5.3)
Sodium: 136 mEq/L (ref 135–145)

## 2012-01-22 MED ORDER — HYDROCHLOROTHIAZIDE 25 MG PO TABS: 12.5000 mg | ORAL_TABLET | Freq: Every day | ORAL | Status: DC

## 2012-01-22 MED ORDER — ATENOLOL 50 MG PO TABS: 25.0000 mg | ORAL_TABLET | Freq: Every day | ORAL | Status: DC

## 2012-01-22 MED ORDER — OMEPRAZOLE 20 MG PO CPDR: 20.0000 mg | DELAYED_RELEASE_CAPSULE | Freq: Two times a day (BID) | ORAL | Status: DC

## 2012-01-22 MED ORDER — LOSARTAN POTASSIUM 50 MG PO TABS: 50.0000 mg | ORAL_TABLET | Freq: Every day | ORAL | Status: DC

## 2012-01-22 MED ORDER — FINASTERIDE 5 MG PO TABS: 5.0000 mg | ORAL_TABLET | Freq: Every day | ORAL | Status: DC

## 2012-01-22 MED ORDER — AMLODIPINE BESYLATE 5 MG PO TABS: 5.0000 mg | ORAL_TABLET | Freq: Every day | ORAL | Status: DC

## 2012-01-22 MED ORDER — SIMVASTATIN 20 MG PO TABS: 10.0000 mg | ORAL_TABLET | Freq: Every day | ORAL | Status: DC

## 2012-01-22 NOTE — Progress Notes (Signed)
  Subjective:    Patient ID: [name], male    DOB: September 09, 1926, 79 y.o.   MRN: [id number]  HPI Routine office visit Hypertension, he was visiting [place] back in October, he had a sudden episode of dizziness that quickly resolve, at that time he did not have headaches, palpitations, diplopia or slurred speech. No syncope. Went to see a local doctor, BP was elevated, EKG reportedly wnl, amlodipine dose was increased , BP gradually decreased. At this point, her BP is around 140-130/80. No further dizziness. High cholesterol, good medication compliance. BPH, was switch from rapaflo to finesteride by urology.   Past Medical History:   Allergic rhinitis   GERD w/ esoph stricture   Borderline DM A1C 5.7 02-2008   HYPERLIPIDEMIA   Hypertension   Benign prostatic hypertrophy   Osteoarthritis   h/o CVA, per CT "old stroke"   INTERNAL HEMORRHOIDS   h/o ANEMIA NOS   COLONIC POLYPS   DEXA normal 10-07   NCS by neurology per pt 5-07: polyneuropathy, ?diabetic   6-06 (-) cardiolite   anxiety    Past Surgical History:   Total knee replacement, 05-2006 (R)   Total knee replacement, 08-2006 (L)   BIL. Inguinal hernia repair (per patient L in the 90s, R in the 80s)   Cataract extraction (01/2009) left  Family History: DM-- B MI-- B age around 29, another B with heart problems dx 80 colon ca--no prostate ca--no liver Ca-- B  Social History: Retired, Married, original from [place] has two children, lost a son in  [place] and one in [place] Patient has never smoked.   Alcohol Use - no    Review of Systems Denies chest pain or shortness of breath No  nausea, vomiting, diarrhea. No blood in the stools.     Objective:   Physical Exam General -- alert, well-developed, and well-nourished.   Neck --no thyromegaly , normal carotid pulse Lungs -- normal respiratory effort, no intercostal retractions, no accessory muscle use, and normal breath sounds.   Heart-- normal rate, regular  rhythm, no murmur, and no gallop.   Extremities-- no pretibial edema bilaterally Neurologic-- alert & oriented X3 ; speech gait and motor are intact, EOMI, PERRLA. Psych-- Cognition and judgment appear intact. Alert and cooperative with normal attention span and concentration.  not anxious appearing and not depressed appearing.       Assessment & Plan:

## 2012-01-22 NOTE — Patient Instructions (Addendum)
Stop Benicar Start losartan  Continue taking your BP   2 or 3 times a week, be sure it is between 110/60 and 140/85. If it is consistently higher or lower, let me know Please come back in 4 months for your physical. If you have more dizziness, let me know

## 2012-01-22 NOTE — Assessment & Plan Note (Signed)
Good medication compliance, recently his BP was elevated, see history of present illness, but now is better with the increase amlodipine. Benicar is being very expensive, losartan?. Also, had a single episode of dizziness a few months ago, advise patient to call if symptoms resurface. Plan: Discontinue Benicar Start losartan Continue monitoring BP.

## 2012-01-24 ENCOUNTER — Encounter: Payer: Self-pay | Admitting: Internal Medicine

## 2012-01-26 ENCOUNTER — Encounter: Payer: Self-pay | Admitting: *Deleted

## 2012-04-21 ENCOUNTER — Encounter: Payer: Self-pay | Admitting: Gastroenterology

## 2012-05-04 ENCOUNTER — Encounter: Payer: Self-pay | Admitting: Gastroenterology

## 2012-05-25 ENCOUNTER — Ambulatory Visit: Payer: Medicare Other | Admitting: Gastroenterology

## 2012-05-26 ENCOUNTER — Ambulatory Visit (INDEPENDENT_AMBULATORY_CARE_PROVIDER_SITE_OTHER): Payer: Medicare Other | Admitting: Internal Medicine

## 2012-05-26 ENCOUNTER — Encounter: Payer: Self-pay | Admitting: Internal Medicine

## 2012-05-26 VITALS — BP 138/70 | HR 62 | Temp 97.6°F | Ht 64.5 in | Wt 134.0 lb

## 2012-05-26 DIAGNOSIS — R7309 Other abnormal glucose: Secondary | ICD-10-CM | POA: Diagnosis not present

## 2012-05-26 DIAGNOSIS — N400 Enlarged prostate without lower urinary tract symptoms: Secondary | ICD-10-CM

## 2012-05-26 DIAGNOSIS — D649 Anemia, unspecified: Secondary | ICD-10-CM | POA: Diagnosis not present

## 2012-05-26 DIAGNOSIS — Z0000 Encounter for general adult medical examination without abnormal findings: Secondary | ICD-10-CM

## 2012-05-26 DIAGNOSIS — E785 Hyperlipidemia, unspecified: Secondary | ICD-10-CM | POA: Diagnosis not present

## 2012-05-26 DIAGNOSIS — I10 Essential (primary) hypertension: Secondary | ICD-10-CM

## 2012-05-26 DIAGNOSIS — R7303 Prediabetes: Secondary | ICD-10-CM

## 2012-05-26 LAB — ALT: ALT: 19 U/L (ref 0–53)

## 2012-05-26 LAB — LIPID PANEL
Cholesterol: 134 mg/dL (ref 0–200)
HDL: 43.8 mg/dL (ref >39.00)
LDL Cholesterol: 63 mg/dL (ref 0–99)
Total CHOL/HDL Ratio: 3
Triglycerides: 137 mg/dL (ref 0.0–149.0)
VLDL: 27.4 mg/dL (ref 0.0–40.0)

## 2012-05-26 LAB — AST: AST: 24 U/L (ref 0–37)

## 2012-05-26 LAB — HEMOGLOBIN A1C: Hgb A1c MFr Bld: 5.9 % (ref 4.6–6.5)

## 2012-05-26 LAB — HEMOGLOBIN: Hemoglobin: 13.9 g/dL (ref 13.0–17.0)

## 2012-05-26 MED ORDER — AMLODIPINE BESYLATE 5 MG PO TABS: 5.0000 mg | ORAL_TABLET | Freq: Every day | ORAL | Status: DC

## 2012-05-26 MED ORDER — OLMESARTAN MEDOXOMIL 20 MG PO TABS: 20.0000 mg | ORAL_TABLET | Freq: Every day | ORAL | Status: DC

## 2012-05-26 MED ORDER — ATENOLOL 50 MG PO TABS: 25.0000 mg | ORAL_TABLET | Freq: Every day | ORAL | Status: DC

## 2012-05-26 MED ORDER — HYDROCHLOROTHIAZIDE 25 MG PO TABS: 12.5000 mg | ORAL_TABLET | Freq: Every day | ORAL | Status: DC

## 2012-05-26 MED ORDER — SIMVASTATIN 20 MG PO TABS: 10.0000 mg | ORAL_TABLET | Freq: Every day | ORAL | Status: DC

## 2012-05-26 MED ORDER — FINASTERIDE 5 MG PO TABS: 5.0000 mg | ORAL_TABLET | Freq: Every day | ORAL | Status: DC

## 2012-05-26 NOTE — Progress Notes (Signed)
  Subjective:    Patient ID: [name], male    DOB: 1926/02/08, 79 y.o.   MRN: [id number]  HPI Here for Medicare AWV:  1.Risk factors based on Past M, S, F history: reviewed  2. physical Activities: occ balance problems, active w/ home chores  3. depression/mood: (-) screening 4. Hearing: poor hearing , uses hearing aids  5. ADL's: independent , still drives  6.Fall Risk: no recent falls, see instructions   7. Home Safety: does feel safe at home  8. height, weight, &visual acuity: see VS, vision good, had B cataract done  9. Counseling: yes  10. Labs ordered based on risk factors: yes  11. Referral Coordination, if needed  12. Care Plan, see a/p  13. Cognitive Assessment: memory and cognition wnl, motor skills  limited by DJD and reported imbalance. Had PT last year to help w/ balance, did help  in addition we discissed the following: Hypertension, good medication compliance, still taking Benicar, ambulatory BPs 120/70. BPH, sees urology, on Proscar, occasionally urinary flow is slow High cholesterol, good medication compliance.  Past Medical History:   Allergic rhinitis   GERD w/ esoph stricture   Borderline DM A1C 5.7 02-2008   HYPERLIPIDEMIA   Hypertension   Benign prostatic hypertrophy   Osteoarthritis   h/o CVA, per CT "old stroke"   INTERNAL HEMORRHOIDS   h/o ANEMIA NOS   COLONIC POLYPS   DEXA normal 10-07   NCS by neurology per pt 5-07: polyneuropathy, ?diabetic   6-06 (-) cardiolite   anxiety     Past Surgical History  Procedure Laterality Date  . Total knee arthroplasty  05/2006    (R)  . Total knee arthroplasty  08/2006    (L)  . Hernia repair      bil inguinal- per pt L in 90s, R in the 80s  . Cataract extraction  01/2009    left   . Cardiovascular stress test  02-2010    done in [place], had CP, +EKG changes, neg nuclear  imagins    Family History: DM-- B MI-- B age around 52, another B with heart problems dx 64 colon ca--no prostate ca--no liver  Ca-- B  Social History: Retired, Married, original from [place], lives w/ wife has two children, lost a son , has a daughter Patient has never smoked.   Alcohol Use - no   Review of Systems No chest pain or shortness of breath Note nausea, vomiting, diarrhea or blood in the stools. No blood in the urine or dysuria. No fever or chills.    Objective:   Physical Exam  BP 138/70  Pulse 62  Temp(Src) 97.6 F (36.4 C) (Oral)  Ht 5' 4.5" (1.638 m)  Wt 134 lb (60.782 kg)  BMI 22.65 kg/m2  SpO2 96%  General -- alert, well-developed, NAD .   Neck --no thyromegaly , normal carotid pulse Lungs -- normal respiratory effort, no intercostal retractions, no accessory muscle use, and normal breath sounds.   Heart-- normal rate, regular rhythm, no murmur, and no gallop.   Abdomen--soft, non-tender, no distention, no masses, no HSM, no guarding, and no rigidity.   Extremities-- no pretibial edema bilaterally, small muscle mass, walks with some difficulty Neurologic-- alert & oriented X3 and strength normal in all extremities. Psych-- Cognition and judgment appear intact. Alert and cooperative with normal attention span and concentration.  not anxious appearing and not depressed appearing.      Assessment & Plan:

## 2012-05-26 NOTE — Assessment & Plan Note (Signed)
Monitor pre-diabetes with a A1c

## 2012-05-26 NOTE — Assessment & Plan Note (Addendum)
Td 3-12 Pneumovax 2006  shingles shot Rx was  provided, never used   Colonoscopy 08/20/2006 : adenomatous polyp, Dr Felecia Jan an appoint w/ GI next week to discuss   Bone Density: 10/21/2005 and  11-09, both normal- neg ; rec ca and vit D   Sees urology last visit 01-2012 diet discussed , encouraged to saty active

## 2012-05-26 NOTE — Assessment & Plan Note (Signed)
Good compliance with medications, check FLP, AST, ALT 

## 2012-05-26 NOTE — Assessment & Plan Note (Signed)
Per urology, now on Proscar

## 2012-05-26 NOTE — Assessment & Plan Note (Addendum)
Screen  for anemia with a hemoglobin

## 2012-05-26 NOTE — Patient Instructions (Addendum)
Consider the use of a cane    Fall Prevention and Home Safety Falls cause injuries and can affect all age groups. It is possible to use preventive measures to significantly decrease the likelihood of falls. There are many simple measures which can make your home safer and prevent falls. OUTDOORS  Repair cracks and edges of walkways and driveways.  Remove high doorway thresholds.  Trim shrubbery on the main path into your home.  Have good outside lighting.  Clear walkways of tools, rocks, debris, and clutter.  Check that handrails are not broken and are securely fastened. Both sides of steps should have handrails.  Have leaves, snow, and ice cleared regularly.  Use sand or salt on walkways during winter months.  In the garage, clean up grease or oil spills. BATHROOM  Install night lights.  Install grab bars by the toilet and in the tub and shower.  Use non-skid mats or decals in the tub or shower.  Place a plastic non-slip stool in the shower to sit on, if needed.  Keep floors dry and clean up all water on the floor immediately.  Remove soap buildup in the tub or shower on a regular basis.  Secure bath mats with non-slip, double-sided rug tape.  Remove throw rugs and tripping hazards from the floors. BEDROOMS  Install night lights.  Make sure a bedside light is easy to reach.  Do not use oversized bedding.  Keep a telephone by your bedside.  Have a firm chair with side arms to use for getting dressed.  Remove throw rugs and tripping hazards from the floor. KITCHEN  Keep handles on pots and pans turned toward the center of the stove. Use back burners when possible.  Clean up spills quickly and allow time for drying.  Avoid walking on wet floors.  Avoid hot utensils and knives.  Position shelves so they are not too high or low.  Place commonly used objects within easy reach.  If necessary, use a sturdy step stool with a grab bar when reaching.  Keep  electrical cables out of the way.  Do not use floor polish or wax that makes floors slippery. If you must use wax, use non-skid floor wax.  Remove throw rugs and tripping hazards from the floor. STAIRWAYS  Never leave objects on stairs.  Place handrails on both sides of stairways and use them. Fix any loose handrails. Make sure handrails on both sides of the stairways are as long as the stairs.  Check carpeting to make sure it is firmly attached along stairs. Make repairs to worn or loose carpet promptly.  Avoid placing throw rugs at the top or bottom of stairways, or properly secure the rug with carpet tape to prevent slippage. Get rid of throw rugs, if possible.  Have an electrician put in a light switch at the top and bottom of the stairs. OTHER FALL PREVENTION TIPS  Wear low-heel or rubber-soled shoes that are supportive and fit well. Wear closed toe shoes.  When using a stepladder, make sure it is fully opened and both spreaders are firmly locked. Do not climb a closed stepladder.  Add color or contrast paint or tape to grab bars and handrails in your home. Place contrasting color strips on first and last steps.  Learn and use mobility aids as needed. Install an electrical emergency response system.  Turn on lights to avoid dark areas. Replace light bulbs that burn out immediately. Get light switches that glow.  Arrange furniture to  create clear pathways. Keep furniture in the same place.  Firmly attach carpet with non-skid or double-sided tape.  Eliminate uneven floor surfaces.  Select a carpet pattern that does not visually hide the edge of steps.  Be aware of all pets. OTHER HOME SAFETY TIPS  Set the water temperature for 120 F (48.8 C).  Keep emergency numbers on or near the telephone.  Keep smoke detectors on every level of the home and near sleeping areas. Document Released: 12/12/2001 Document Revised: 06/23/2011 Document Reviewed: 03/13/2011 [facility]  Patient Information 2014 [place], [place].

## 2012-05-26 NOTE — Assessment & Plan Note (Signed)
Well controlled on Benicar,   never did switch to losartan. Last BMP normal. Plan: No change, BMP on return to the office

## 2012-05-27 DIAGNOSIS — H109 Unspecified conjunctivitis: Secondary | ICD-10-CM | POA: Diagnosis not present

## 2012-05-31 ENCOUNTER — Ambulatory Visit (INDEPENDENT_AMBULATORY_CARE_PROVIDER_SITE_OTHER): Payer: Medicare Other | Admitting: Gastroenterology

## 2012-05-31 ENCOUNTER — Encounter: Payer: Self-pay | Admitting: Gastroenterology

## 2012-05-31 ENCOUNTER — Encounter: Payer: Self-pay | Admitting: *Deleted

## 2012-05-31 VITALS — BP 100/50 | HR 75 | Ht 64.0 in | Wt 134.5 lb

## 2012-05-31 DIAGNOSIS — D126 Benign neoplasm of colon, unspecified: Secondary | ICD-10-CM | POA: Diagnosis not present

## 2012-05-31 NOTE — Progress Notes (Signed)
History of Present Illness: pleasant 79 year old [place] male  with history of adenomatous polyps in 2003 and 2008.  He has no abdominal complaints including change in bowel habits, pain, melena or hematochezia.    Past Medical History  Diagnosis Date  . Allergic rhinitis   . GERD (gastroesophageal reflux disease)     w/ esoph stricture  . Diabetes mellitus   . Hyperlipidemia   . Hypertension   . BPH (benign prostatic hypertrophy)   . Osteoarthritis   . Stroke     CVA, per CT "old stroke"  . Internal hemorrhoids   . Anemia   . History of colonic polyps   . Polyneuropathy     NCS by neurology per pt 5/07  . Anxiety   . Allergic rhinitis    Past Surgical History  Procedure Laterality Date  . Total knee arthroplasty  05/2006    (R)  . Total knee arthroplasty  08/2006    (L)  . Hernia repair      bil inguinal- per pt L in 90s, R in the 80s  . Cataract extraction  01/2009    left   . Cardiovascular stress test  02-2010    done in [place], had CP, +EKG changes, neg nuclear  imagins   family history includes Diabetes in his brother; Heart attack (age of onset: 20) in his brother; and Liver cancer in his brother.  There is no history of Colon cancer and Prostate cancer. Current Outpatient Prescriptions  Medication Sig Dispense Refill  . amLODipine (NORVASC) 5 MG tablet Take 1 tablet (5 mg total) by mouth daily.  90 tablet  3  . aspirin 81 MG tablet Take 81 mg by mouth daily.        Marland Kitchen atenolol (TENORMIN) 50 MG tablet Take 0.5 tablets (25 mg total) by mouth daily.  45 tablet  3  . finasteride (PROSCAR) 5 MG tablet Take 1 tablet (5 mg total) by mouth daily.  90 tablet  3  . fish oil-omega-3 fatty acids 1000 MG capsule Take 1 g by mouth daily.      . Flaxseed, Linseed, (FLAXSEED OIL PO) Take 1 capsule by mouth daily.      . hydrochlorothiazide (HYDRODIURIL) 25 MG tablet Take 0.5 tablets (12.5 mg total) by mouth daily.  45 tablet  3  . multivitamin (THERAGRAN) per tablet Take 1 tablet by  mouth daily.        Marland Kitchen olmesartan (BENICAR) 20 MG tablet Take 1 tablet (20 mg total) by mouth daily.  90 tablet  3  . omeprazole (PRILOSEC) 20 MG capsule Take 1 capsule (20 mg total) by mouth 2 (two) times daily.  180 capsule  1  . simvastatin (ZOCOR) 20 MG tablet Take 0.5 tablets (10 mg total) by mouth at bedtime.  45 tablet  3  . azelastine (ASTELIN) 137 MCG/SPRAY nasal spray Place 2 sprays into the nose as needed. Use in each nostril as directed       No current facility-administered medications for this visit.   Allergies as of 05/31/2012 - Review Complete 05/31/2012  Allergen Reaction Noted  . Amoxicillin Other (See Comments) 05/31/2012    reports that he has never smoked. He has never used smokeless tobacco. He reports that he does not drink alcohol. His drug history is not on file.     Review of Systems:  He complains of urinary frequency. Pertinent positive and negative review of systems were noted in the above HPI section. All other review  of systems were otherwise negative.  Vital signs were reviewed in today's medical record Physical Exam: General: Well developed , well nourished, no acute distress Skin: anicteric Head: Normocephalic and atraumatic Eyes:  sclerae anicteric, EOMI Ears: Normal auditory acuity Mouth: No deformity or lesions Neck: Supple, no masses or thyromegaly Lungs: Clear throughout to auscultation Heart: Regular rate and rhythm; no murmurs, rubs or bruits Abdomen: Soft, non tender and non distended. No masses, hepatosplenomegaly or hernias noted. Normal Bowel sounds Rectal:deferred Musculoskeletal: Symmetrical with no gross deformities  Skin: No lesions on visible extremities Pulses:  Normal pulses noted Extremities: No clubbing, cyanosis, edema or deformities noted Neurological: Alert oriented x 4, grossly nonfocal Cervical Nodes:  No significant cervical adenopathy Inguinal Nodes: No significant inguinal adenopathy Psychological:  Alert and  cooperative. Normal mood and affect

## 2012-05-31 NOTE — Patient Instructions (Addendum)
Follow up as needed

## 2012-05-31 NOTE — Assessment & Plan Note (Signed)
Usually, routine screening/surveillance colonoscopy would be recommended to be done in 3-5 years.  However since colon cancer screening tests generally stop between ages 89 and 47,  In the absence of GI complaints, I will defer routine colonoscopy.

## 2012-06-01 ENCOUNTER — Telehealth: Payer: Self-pay | Admitting: Internal Medicine

## 2012-06-01 NOTE — Telephone Encounter (Signed)
Patient is calling with questions in regards to whether his dosage of blood pressure medication needs to be changed. States that his blood pressure has been low over the past couple days so he has not been taking his bp medication. Needs to be advised.

## 2012-06-01 NOTE — Telephone Encounter (Signed)
The blood pressure is very good, I don't see a need to change

## 2012-06-01 NOTE — Telephone Encounter (Signed)
Pt left msg stating that his BP yesterday was 111/60 & 123/65. Pt has not taken any of his BP medication. Pt would like to know if his BP meds need to be changed. Please advise.

## 2012-06-01 NOTE — Telephone Encounter (Signed)
Discussed with pt

## 2012-06-06 ENCOUNTER — Other Ambulatory Visit: Payer: Self-pay | Admitting: Internal Medicine

## 2012-06-06 NOTE — Telephone Encounter (Signed)
Not on pt's current med list. OK to refill? 

## 2012-06-07 NOTE — Telephone Encounter (Signed)
No refills, the patient is taking Benicar

## 2012-06-07 NOTE — Telephone Encounter (Signed)
Pharmacy made aware

## 2012-06-24 ENCOUNTER — Ambulatory Visit (INDEPENDENT_AMBULATORY_CARE_PROVIDER_SITE_OTHER)
Admission: RE | Admit: 2012-06-24 | Discharge: 2012-06-24 | Disposition: A | Discharge status: Home / Self Care | Payer: Medicare Other | Source: Ambulatory Visit | Attending: Internal Medicine | Admitting: Internal Medicine

## 2012-06-24 ENCOUNTER — Ambulatory Visit: Payer: Medicare Other | Admitting: Internal Medicine

## 2012-06-24 ENCOUNTER — Ambulatory Visit (INDEPENDENT_AMBULATORY_CARE_PROVIDER_SITE_OTHER): Payer: Medicare Other | Admitting: Internal Medicine

## 2012-06-24 ENCOUNTER — Encounter: Payer: Self-pay | Admitting: Internal Medicine

## 2012-06-24 VITALS — BP 118/60 | HR 69 | Temp 97.7°F | Wt 137.8 lb

## 2012-06-24 DIAGNOSIS — I10 Essential (primary) hypertension: Secondary | ICD-10-CM

## 2012-06-24 DIAGNOSIS — S20212A Contusion of left front wall of thorax, initial encounter: Secondary | ICD-10-CM

## 2012-06-24 DIAGNOSIS — N529 Male erectile dysfunction, unspecified: Secondary | ICD-10-CM | POA: Diagnosis not present

## 2012-06-24 DIAGNOSIS — S20219A Contusion of unspecified front wall of thorax, initial encounter: Secondary | ICD-10-CM | POA: Diagnosis not present

## 2012-06-24 DIAGNOSIS — N400 Enlarged prostate without lower urinary tract symptoms: Secondary | ICD-10-CM | POA: Diagnosis not present

## 2012-06-24 NOTE — Assessment & Plan Note (Signed)
Well controlled, request samples of Benicar, they were provided

## 2012-06-24 NOTE — Patient Instructions (Addendum)
Tylenol  500 mg OTC 2 tabs a day every 8 hours as needed for pain Call if the pain is not better in few days or if it gets worse  Please get your x-ray at the other [place]  office located at: [address] [place], across from [facility].  Please go to the basement, this is a walk-in facility, they are open from 8:30 to 5:30 PM. Phone number [phone].  Or at:  Get the XR at [facility], corner of [address] (10 minutes form here); they are open 24/7 [address]  [place], [place] [address] [phone]

## 2012-06-24 NOTE — Progress Notes (Signed)
  Subjective:    Patient ID: [name], male    DOB: May 03, 1926, 79 y.o.   MRN: [id number]  HPI Acute visit Few days ago had a mechanical fall, landed on his left chest on the corner of a coffee-table, the coroner was not sharp, there was no bleeding. He is hurting mildly to moderately. Not taking pain meds.  Past Medical History:  Allergic rhinitis  GERD w/ esoph stricture  Borderline DM A1C 5.7 02-2008  HYPERLIPIDEMIA  Hypertension  Benign prostatic hypertrophy  Osteoarthritis  h/o CVA, per CT "old stroke"  INTERNAL HEMORRHOIDS  h/o ANEMIA NOS  COLONIC POLYPS  DEXA normal 10-07  NCS by neurology per pt 5-07: polyneuropathy, ?diabetic  6-06 (-) cardiolite  anxiety   Past Surgical History  Procedure Laterality Date  . Total knee arthroplasty  05/2006    (R)  . Total knee arthroplasty  08/2006    (L)  . Hernia repair      bil inguinal- per pt L in 90s, R in the 80s  . Cataract extraction  01/2009    left   . Cardiovascular stress test  02-2010    done in [place], had CP, +EKG changes, neg nuclear  imagins   Social History:  Retired, Married, original from [place], lives w/ wife  has two children, lost a son , has a daughter  Patient has never smoked.  Alcohol Use - no   Review of Systems Denies any syncope, cough, shortness of breath. No neck pain, back pain or other injuries.     Objective:   Physical Exam  Musculoskeletal:       Arms:   General -- alert, well-developed, NAD   Neck --no crepitus  Lungs -- normal respiratory effort, no intercostal retractions, no accessory muscle use, and normal breath sounds.   Heart-- normal rate, regular rhythm, no murmur, and no gallop.   Neurologic-- alert & oriented X3 and strength normal in all extremities. Psych-- Cognition and judgment appear intact. Alert and cooperative with normal attention span and concentration.  not anxious appearing and not depressed appearing.       Assessment & Plan:   Chest contussion. New  problem. Recommend rest and Tylenol. We discussed x-ray versus observation. he elected x-ray, will call w/ results.

## 2012-06-25 ENCOUNTER — Encounter: Payer: Self-pay | Admitting: Internal Medicine

## 2012-06-28 DIAGNOSIS — M79609 Pain in unspecified limb: Secondary | ICD-10-CM | POA: Diagnosis not present

## 2012-06-28 DIAGNOSIS — B351 Tinea unguium: Secondary | ICD-10-CM | POA: Diagnosis not present

## 2012-07-01 DIAGNOSIS — H04129 Dry eye syndrome of unspecified lacrimal gland: Secondary | ICD-10-CM | POA: Diagnosis not present

## 2012-07-01 DIAGNOSIS — Z961 Presence of intraocular lens: Secondary | ICD-10-CM | POA: Diagnosis not present

## 2012-07-01 DIAGNOSIS — H11159 Pinguecula, unspecified eye: Secondary | ICD-10-CM | POA: Diagnosis not present

## 2012-07-01 DIAGNOSIS — H1045 Other chronic allergic conjunctivitis: Secondary | ICD-10-CM | POA: Diagnosis not present

## 2012-09-16 DIAGNOSIS — I499 Cardiac arrhythmia, unspecified: Secondary | ICD-10-CM | POA: Diagnosis not present

## 2012-09-16 DIAGNOSIS — I10 Essential (primary) hypertension: Secondary | ICD-10-CM | POA: Diagnosis not present

## 2012-09-16 DIAGNOSIS — J811 Chronic pulmonary edema: Secondary | ICD-10-CM | POA: Diagnosis not present

## 2012-09-27 DIAGNOSIS — R490 Dysphonia: Secondary | ICD-10-CM | POA: Diagnosis not present

## 2012-10-03 ENCOUNTER — Ambulatory Visit: Payer: Medicare Other | Attending: Internal Medicine | Admitting: Physical Therapy

## 2012-10-03 DIAGNOSIS — M542 Cervicalgia: Secondary | ICD-10-CM | POA: Insufficient documentation

## 2012-10-03 DIAGNOSIS — M545 Low back pain, unspecified: Secondary | ICD-10-CM | POA: Insufficient documentation

## 2012-10-03 DIAGNOSIS — IMO0001 Reserved for inherently not codable concepts without codable children: Secondary | ICD-10-CM | POA: Diagnosis not present

## 2012-10-05 ENCOUNTER — Other Ambulatory Visit: Payer: Self-pay | Admitting: *Deleted

## 2012-10-05 MED ORDER — ATENOLOL 50 MG PO TABS: 25.0000 mg | ORAL_TABLET | Freq: Every day | ORAL | Status: DC

## 2012-10-05 MED ORDER — AMLODIPINE BESYLATE 5 MG PO TABS: 5.0000 mg | ORAL_TABLET | Freq: Every day | ORAL | Status: DC

## 2012-10-05 MED ORDER — OMEPRAZOLE 20 MG PO CPDR: 20.0000 mg | DELAYED_RELEASE_CAPSULE | Freq: Two times a day (BID) | ORAL | Status: DC

## 2012-10-05 MED ORDER — HYDROCHLOROTHIAZIDE 25 MG PO TABS: 12.5000 mg | ORAL_TABLET | Freq: Every day | ORAL | Status: DC

## 2012-10-05 NOTE — Telephone Encounter (Signed)
Refills sent to [facility]

## 2012-10-10 ENCOUNTER — Ambulatory Visit: Payer: Medicare Other | Attending: Internal Medicine | Admitting: Physical Therapy

## 2012-10-10 DIAGNOSIS — M542 Cervicalgia: Secondary | ICD-10-CM | POA: Diagnosis not present

## 2012-10-10 DIAGNOSIS — IMO0001 Reserved for inherently not codable concepts without codable children: Secondary | ICD-10-CM | POA: Insufficient documentation

## 2012-10-10 DIAGNOSIS — M545 Low back pain, unspecified: Secondary | ICD-10-CM | POA: Diagnosis not present

## 2012-10-13 ENCOUNTER — Ambulatory Visit: Payer: Medicare Other | Admitting: Physical Therapy

## 2012-10-17 ENCOUNTER — Ambulatory Visit (INDEPENDENT_AMBULATORY_CARE_PROVIDER_SITE_OTHER): Payer: Medicare Other | Admitting: Internal Medicine

## 2012-10-17 ENCOUNTER — Ambulatory Visit: Payer: Medicare Other | Admitting: Physical Therapy

## 2012-10-17 ENCOUNTER — Encounter: Payer: Self-pay | Admitting: Internal Medicine

## 2012-10-17 VITALS — BP 160/74 | HR 58 | Temp 97.9°F | Wt 137.0 lb

## 2012-10-17 DIAGNOSIS — I10 Essential (primary) hypertension: Secondary | ICD-10-CM | POA: Diagnosis not present

## 2012-10-17 DIAGNOSIS — Z23 Encounter for immunization: Secondary | ICD-10-CM

## 2012-10-17 NOTE — Progress Notes (Signed)
  Subjective:    Patient ID: [name], male    DOB: Jan 10, 1926, 79 y.o.   MRN: [id number]  HPI Acute visit Bumped his head last night with with the door of a open cabinet in his  kitchen, bled briefly, likes me to look at the injury. BP noted to be elevated today, ambulatory BPs usually normal. Also requests samples of Benicar   Past Medical History:   Allergic rhinitis   GERD w/ esoph stricture   Borderline DM A1C 5.7 02-2008   HYPERLIPIDEMIA   Hypertension   Benign prostatic hypertrophy   Osteoarthritis   h/o CVA, per CT "old stroke"   INTERNAL HEMORRHOIDS   h/o ANEMIA NOS   COLONIC POLYPS   DEXA normal 10-07   NCS by neurology per pt 5-07: polyneuropathy, ?diabetic   6-06 (-) cardiolite   anxiety   Past Surgical History  Procedure Laterality Date  . Total knee arthroplasty  05/2006    (R)  . Total knee arthroplasty  08/2006    (L)  . Hernia repair      bil inguinal- per pt L in 90s, R in the 80s  . Cataract extraction  01/2009    left   . Cardiovascular stress test  02-2010    done in [place], had CP, +EKG changes, neg nuclear  imagins   History   Social History  . Marital Status: Married    Spouse Name: N/A    Number of Children: 2  . Years of Education: N/A   Occupational History  . retired    Social History Main Topics  . Smoking status: Never Smoker   . Smokeless tobacco: Never Used  . Alcohol Use: No  . Drug Use: No  . Sexual Activity: Not on file   Other Topics Concern  . Not on file   Social History Narrative   Retired, Married, original from [place], lives w/ wife     has two children, lost a son , has a daughter           Review of Systems Denies syncope. No headache, nausea or vomiting.      Objective:   Physical Exam  Constitutional: He appears well-developed and well-nourished. No distress.  HENT:  Head:    Skin: He is not diaphoretic.  Psychiatric: He has a normal mood and affect. His behavior is normal. Judgment and thought  content normal.   BP 160/74  Pulse 58  Temp(Src) 97.9 F (36.6 C)  Wt 137 lb (62.143 kg)  BMI 23.5 kg/m2  SpO2 98%        Assessment & Plan:  Superficial cut, see instructions. He is up to date on immunizations.  Internationals travle--going to [place], recommend to call the ID clinic for travel advise

## 2012-10-17 NOTE — Patient Instructions (Signed)
Keep area clean and dry Use an antibiotic ointment daily for 1 week Call if area gets red, swollen or you see discharge  For international travel advise, please call the infectious disease clinic: [phone]

## 2012-10-17 NOTE — Assessment & Plan Note (Signed)
BP elevated today, reports normal ambulatory BPs, no change, continue monitoring his BP. Samples of Benicar provided

## 2012-10-19 ENCOUNTER — Ambulatory Visit: Payer: Medicare Other | Admitting: Physical Therapy

## 2012-10-25 ENCOUNTER — Ambulatory Visit: Payer: Medicare Other | Admitting: Physical Therapy

## 2012-10-27 ENCOUNTER — Ambulatory Visit: Payer: Medicare Other | Admitting: Physical Therapy

## 2012-10-29 ENCOUNTER — Encounter: Payer: Self-pay | Admitting: Family Medicine

## 2012-10-29 ENCOUNTER — Ambulatory Visit (INDEPENDENT_AMBULATORY_CARE_PROVIDER_SITE_OTHER): Payer: Medicare Other | Admitting: Family Medicine

## 2012-10-29 VITALS — BP 130/86 | HR 85 | Temp 98.2°F | Resp 16 | Wt 137.8 lb

## 2012-10-29 DIAGNOSIS — J209 Acute bronchitis, unspecified: Secondary | ICD-10-CM

## 2012-10-29 MED ORDER — AZITHROMYCIN 250 MG PO TABS: ORAL_TABLET | ORAL | Status: DC

## 2012-10-29 NOTE — Patient Instructions (Signed)
I do think you have bronchitis. Treat with azithromycin (zpack) Honey with lemon or cough drops to soothe throat. Immediate release guaifenesin with plenty of water to help mobilize mucous. May use delsym or dimetapp over the counter cough syrup if needed to help suppress cough. If fever >101, worsening productive cough or any trouble breathing, please return to see [place] right away.

## 2012-10-29 NOTE — Progress Notes (Signed)
  Subjective:    Patient ID: [name], male    DOB: May 24, 1926, 79 y.o.   MRN: [id number]  HPI [id number]  Very pleasant 79 yo presents with wife to Saturday clinic.  Endorses 1 wk h/o cough, worse for last 2 days with deep hoarse cough - pain in throat when coughing.  Mucous feels stuck in chest.  Hoarse voice.  Clear mucous.  Worsening shoulder pain with cough.  + coryza, sinus congestion and rhinorrhea.  No fevers/chills, chills, headaches, ear or tooth pain, PNdrainage.  Hasn't tried anything for this. No smokers at home. No h/o asthma.  Did receive flu shot this week. Some allergic rhinitis. No sick contacts at home.  Lab Results  Component Value Date   HGBA1C 5.9 05/26/2012   Past Medical History  Diagnosis Date  . Allergic rhinitis   . GERD (gastroesophageal reflux disease)     w/ esoph stricture  . Diabetes mellitus   . Hyperlipidemia   . Hypertension   . BPH (benign prostatic hypertrophy)   . Osteoarthritis   . Stroke     CVA, per CT "old stroke"  . Internal hemorrhoids   . Anemia   . History of colonic polyps   . Polyneuropathy     NCS by neurology per pt 5/07  . Anxiety   . Allergic rhinitis      Review of Systems Per HPI    Objective:   Physical Exam  Nursing note and vitals reviewed. Constitutional: He appears well-developed and well-nourished. No distress.  HENT:  Head: Normocephalic and atraumatic.  Right Ear: Hearing, tympanic membrane, external ear and ear canal normal.  Left Ear: Hearing, tympanic membrane, external ear and ear canal normal.  Nose: Nose normal. No mucosal edema or rhinorrhea. Right sinus exhibits no maxillary sinus tenderness and no frontal sinus tenderness. Left sinus exhibits no maxillary sinus tenderness and no frontal sinus tenderness.  Mouth/Throat: Uvula is midline, oropharynx is clear and moist and mucous membranes are normal. No oropharyngeal exudate, posterior oropharyngeal edema, posterior oropharyngeal erythema or  tonsillar abscesses.  Eyes: Conjunctivae and EOM are normal. Pupils are equal, round, and reactive to light. No scleral icterus.  Neck: Normal range of motion. Neck supple.  Cardiovascular: Normal rate, regular rhythm, normal heart sounds and intact distal pulses.   No murmur heard. Pulmonary/Chest: Effort normal and breath sounds normal. No respiratory distress. He has no decreased breath sounds. He has no wheezes. He has no rhonchi. He has no rales.  Harsh deep cough present Lungs clear  Lymphadenopathy:    He has no cervical adenopathy.  Skin: Skin is warm and dry. No rash noted.       Assessment & Plan:

## 2012-10-29 NOTE — Assessment & Plan Note (Signed)
I do think he has an acute bronchitis given severity of cough. Given age, will cover for atypical bacterial infection with zpack. Supportive care as per instructions. Red flags to return extensively discussed. Pt agrees with plan, agrees to see PCP this week if no improvement or any worsening sxs.

## 2012-10-31 ENCOUNTER — Telehealth: Payer: Self-pay | Admitting: Internal Medicine

## 2012-10-31 NOTE — Telephone Encounter (Signed)
Okay to see as new allergy consult patient; next open appointment please. Thanks.

## 2012-10-31 NOTE — Telephone Encounter (Signed)
I spoke with pt wife. She was requesting to speak with Katie. I advised her she was with patients. She reports she is wanting pt to get in and see Dr. Maple Hudson. He has been coughing all weekend. Went to UC and was told he had bronchitis. She is requesting pt to get in and see Dr. Maple Hudson as new patient. She reports they know them very well and asked that I speak with Katie. Please advise thanks  Allergies  Allergen Reactions  . Amoxicillin Other (See Comments)    "loose motions" "giddiness"

## 2012-10-31 NOTE — Telephone Encounter (Signed)
Called and spoke with pt's wife. Offered next available consult slot with CY which is 12/21/12. Was told that she wanted to talk to [facility]. States that she knows Florentina Addison and she would work them in sooner.  Katie please advise. Thanks.

## 2012-10-31 NOTE — Telephone Encounter (Signed)
We are familiar with patient's wife however we do not have any slots at this time to get patient in any sooner; they are more than welcome to call the office periodically and check for any cancellations.

## 2012-10-31 NOTE — Telephone Encounter (Signed)
I spoke with the pt and advised of below. He ask to be scheduled with any doctor, so appt set with MW on Monday at 10:45am. Carron Curie, CMA

## 2012-11-01 ENCOUNTER — Ambulatory Visit: Payer: Medicare Other | Admitting: Physical Therapy

## 2012-11-03 ENCOUNTER — Ambulatory Visit: Payer: Medicare Other | Admitting: Physical Therapy

## 2012-11-07 ENCOUNTER — Ambulatory Visit (INDEPENDENT_AMBULATORY_CARE_PROVIDER_SITE_OTHER)
Admission: RE | Admit: 2012-11-07 | Discharge: 2012-11-07 | Disposition: A | Discharge status: Home / Self Care | Payer: Medicare Other | Source: Ambulatory Visit | Attending: Internal Medicine | Admitting: Internal Medicine

## 2012-11-07 ENCOUNTER — Encounter: Payer: Self-pay | Admitting: Internal Medicine

## 2012-11-07 ENCOUNTER — Ambulatory Visit (INDEPENDENT_AMBULATORY_CARE_PROVIDER_SITE_OTHER): Payer: Medicare Other | Admitting: Internal Medicine

## 2012-11-07 VITALS — BP 120/70 | HR 62 | Temp 98.7°F | Ht 62.0 in | Wt 139.0 lb

## 2012-11-07 DIAGNOSIS — K219 Gastro-esophageal reflux disease without esophagitis: Secondary | ICD-10-CM

## 2012-11-07 DIAGNOSIS — J209 Acute bronchitis, unspecified: Secondary | ICD-10-CM

## 2012-11-07 DIAGNOSIS — J40 Bronchitis, not specified as acute or chronic: Secondary | ICD-10-CM | POA: Diagnosis not present

## 2012-11-07 MED ORDER — PREDNISONE (PAK) 10 MG PO TABS: ORAL_TABLET | ORAL | Status: DC

## 2012-11-07 NOTE — Assessment & Plan Note (Signed)
Explained natural history of uri and why it's necessary in patients at risk to treat GERD aggressively  at least  short term   to reduce risk of evolving cyclical cough initially  triggered by epithelial injury and a heightened sensitivty to the effects of any upper airway irritants,  most importantly acid - related.  That is, the more sensitive the epithelium damaged for virus, the more the cough, the more the secondary reflux (especially in those prone to reflux) the more the irritation of the sensitive mucosa and so on in a cyclical pattern.  See instructions for specific recommendations which were reviewed directly with the patient who was given a copy with highlighter outlining the key components.  

## 2012-11-07 NOTE — Progress Notes (Signed)
  Subjective:    Patient ID: [name], male    DOB: November 26, 1926 MRN: [id number]  HPI   45 yowm quit smoking 1960's no problems at all  Then with sob or cough with recurrent cough since 1970's usually responds to antibiotics referred by [facility] 11/07/2012  For recurrent cough  11/07/2012 1st [facility] office visit/ Wert cc acute onset cough in setting of uri 2 weeks prior to OV   with sense of chest congestion and sob rx with zpak since 10/25 > yellow mucus much looser since started abx with no fever but mild nasal congestion and sense of doe but not at rest     Already on prilosec bid ac.  No obvious day to day or daytime variabilty or assoc chronic cough or cp or chest tightness, subjective wheeze overt sinus or hb symptoms. No unusual exp hx or h/o childhood pna/ asthma or knowledge of premature birth.  Sleeping ok without nocturnal  or early am exacerbation  of respiratory  c/o's or need for noct saba. Also denies any obvious fluctuation of symptoms with weather or environmental changes or other aggravating or alleviating factors except as outlined above   Current Medications, Allergies, Complete Past Medical History, Past Surgical History, Family History, and Social History were reviewed in [employer] record.        Review of Systems  Constitutional: Negative for fever and unexpected weight change.  HENT: Positive for congestion, sinus pressure and sneezing. Negative for dental problem, ear pain, nosebleeds, postnasal drip, rhinorrhea, sore throat and trouble swallowing.   Eyes: Negative for redness and itching.  Respiratory: Positive for cough and shortness of breath. Negative for chest tightness and wheezing.   Cardiovascular: Negative for palpitations and leg swelling.  Gastrointestinal: Negative for nausea and vomiting.  Genitourinary: Negative for dysuria.  Musculoskeletal: Negative for joint swelling.  Skin: Negative for rash.   Neurological: Negative for headaches.  Hematological: Does not bruise/bleed easily.  Psychiatric/Behavioral: Negative for dysphoric mood. The patient is not nervous/anxious.        Objective:   Physical Exam  Wt Readings from Last 3 Encounters:  11/07/12 139 lb (63.05 kg)  10/29/12 137 lb 12 oz (62.483 kg)  10/17/12 137 lb (62.143 kg)      HEENT: nl dentition, turbinates, and orophanx. Nl external ear canals without cough reflex   NECK :  without JVD/Nodes/TM/ nl carotid upstrokes bilaterally   LUNGS: no acc muscle use,   cough on exp very min bil exp rhonchi   CV:  RRR  no s3 or murmur or increase in P2, no edema   ABD:  soft and nontender with nl excursion in the supine position. No bruits or organomegaly, bowel sounds nl  MS:  warm without deformities, calf tenderness, cyanosis or clubbing  SKIN: warm and dry without lesions    NEURO:  alert, approp, no deficits    CXR  11/07/2012 :  No active cardiopulmonary disease.      Assessment & Plan:

## 2012-11-07 NOTE — Assessment & Plan Note (Signed)
Nothing clinically to suggest chronic bronchitis or copd in this pt who appears to be completely healthy between flares of bronchitis  He does have min exp cough/ rhonchi bilaterally so added short course of prednisone - if more freq flares than twice yearly could consider returning for baseline pft's and consideration for prn combivent

## 2012-11-07 NOTE — Patient Instructions (Signed)
Prednisone 10 mg take  4 each am x 2 days,   2 each am x 2 days,  1 each am x 2 days and stop  GERD (REFLUX)  is an extremely common cause of respiratory symptoms, many times with no significant heartburn at all.    It can be treated with medication, but also with lifestyle changes including avoidance of late meals, excessive alcohol, smoking cessation, and avoid fatty foods, chocolate, peppermint, colas, red wine, and acidic juices such as orange juice.  NO MINT OR MENTHOL PRODUCTS SO NO COUGH DROPS  USE SUGARLESS CANDY INSTEAD (jolley ranchers or Stover's)  NO OIL BASED VITAMINS - use powdered substitutes.   Ok to use robitussin DM as per bottle   Please remember to go to the   x-ray department downstairs for your tests - we will call you with the results when they are available.    If not all better in 2 weeks please return here - follow up is as needed

## 2012-11-08 ENCOUNTER — Ambulatory Visit: Payer: Medicare Other | Attending: Internal Medicine | Admitting: Physical Therapy

## 2012-11-08 DIAGNOSIS — M542 Cervicalgia: Secondary | ICD-10-CM | POA: Diagnosis not present

## 2012-11-08 DIAGNOSIS — IMO0001 Reserved for inherently not codable concepts without codable children: Secondary | ICD-10-CM | POA: Insufficient documentation

## 2012-11-08 DIAGNOSIS — M545 Low back pain, unspecified: Secondary | ICD-10-CM | POA: Insufficient documentation

## 2012-11-08 NOTE — Progress Notes (Signed)
Quick Note:  Spoke with pt and notified of results per Dr. Wert. Pt verbalized understanding and denied any questions.  ______ 

## 2012-11-10 ENCOUNTER — Ambulatory Visit: Payer: Medicare Other | Admitting: Physical Therapy

## 2012-11-14 ENCOUNTER — Ambulatory Visit: Payer: Medicare Other | Admitting: Physical Therapy

## 2012-11-17 ENCOUNTER — Ambulatory Visit: Payer: Medicare Other | Admitting: Physical Therapy

## 2012-11-21 ENCOUNTER — Ambulatory Visit: Payer: Medicare Other | Admitting: Internal Medicine

## 2012-11-22 ENCOUNTER — Ambulatory Visit: Payer: Medicare Other | Admitting: Physical Therapy

## 2012-11-23 ENCOUNTER — Encounter: Payer: Self-pay | Admitting: Internal Medicine

## 2012-11-23 ENCOUNTER — Ambulatory Visit (INDEPENDENT_AMBULATORY_CARE_PROVIDER_SITE_OTHER): Payer: Medicare Other | Admitting: Internal Medicine

## 2012-11-23 VITALS — BP 112/58 | HR 65 | Ht 64.5 in | Wt 138.8 lb

## 2012-11-23 DIAGNOSIS — J209 Acute bronchitis, unspecified: Secondary | ICD-10-CM | POA: Diagnosis not present

## 2012-11-23 NOTE — Assessment & Plan Note (Signed)
For future reference  Explained natural history of uri and why it's necessary in patients at risk to treat GERD aggressively  at least  short term   to reduce risk of evolving cyclical cough initially  triggered by epithelial injury and a heightened sensitivty to the effects of any upper airway irritants,  most importantly acid - related.  That is, the more sensitive the epithelium damaged for virus, the more the cough, the more the secondary reflux (especially in those prone to reflux) the more the irritation of the sensitive mucosa and so on in a cyclical pattern.   Therefore at onset of any resp flare needs to use bid prilosec until cough gone s need for any cough suppression.    Each maintenance medication was reviewed in detail including most importantly the difference between maintenance and as needed and under what circumstances the prns are to be used.  Please see instructions for details which were reviewed in writing and the patient given a copy.

## 2012-11-23 NOTE — Progress Notes (Signed)
  Subjective:    Patient ID: [name], male    DOB: [birth year] MRN: [id number]     Brief patient profile:  86 yowm quit smoking 1960's no problems at all  Then with sob or cough with recurrent cough since 1970's usually responds to antibiotics referred by [facility] 11/07/2012  For recurrent cough   History of Present Illness  11/07/2012 1st [facility] office visit/ Wert cc acute onset cough in setting of uri 2 weeks prior to OV   with sense of chest congestion and sob rx with zpak since 10/25 > yellow mucus much looser since started abx with no fever but mild nasal congestion and sense of doe but not at rest    Already on prilosec bid ac. Prednisone 10 mg take  4 each am x 2 days,   2 each am x 2 days,  1 each am x 2 days and stop GERD diet    11/23/2012 f/u ov/Wert re: cough better on above but reduced prilosec already to one daily  Chief Complaint  Patient presents with  . Follow-up    Pt states breathing is back at baseline. Has minimal dry cough. No new co's today  still using rob dm but much less  No obvious day to day or daytime variabilty or assoc sob  or cp or chest tightness, subjective wheeze overt sinus or hb symptoms. No unusual exp hx or h/o childhood pna/ asthma or knowledge of premature birth.  Sleeping ok without nocturnal  or early am exacerbation  of respiratory  c/o's or need for noct saba. Also denies any obvious fluctuation of symptoms with weather or environmental changes or other aggravating or alleviating factors except as outlined above   Current Medications, Allergies, Complete Past Medical History, Past Surgical History, Family History, and Social History were reviewed in [employer] record.  ROS  The following are not active complaints unless bolded sore throat, dysphagia, dental problems, itching, sneezing,  nasal congestion or excess/ purulent secretions, ear ache,   fever, chills, sweats, unintended wt loss, pleuritic or  exertional cp, hemoptysis,  orthopnea pnd or leg swelling, presyncope, palpitations, heartburn, abdominal pain, anorexia, nausea, vomiting, diarrhea  or change in bowel or urinary habits, change in stools or urine, dysuria,hematuria,  rash, arthralgias, visual complaints, headache, numbness weakness or ataxia or problems with walking or coordination,  change in mood/affect or memory.                  Objective:   Physical Exam  11/23/2012 139  Wt Readings from Last 3 Encounters:  11/07/12 139 lb (63.05 kg)  10/29/12 137 lb 12 oz (62.483 kg)  10/17/12 137 lb (62.143 kg)      HEENT: nl dentition, turbinates, and orophanx. Nl external ear canals without cough reflex   NECK :  without JVD/Nodes/TM/ nl carotid upstrokes bilaterally   LUNGS: no acc muscle use,  Completely clear on exp   CV:  RRR  no s3 or murmur or increase in P2, no edema   ABD:  soft and nontender with nl excursion in the supine position. No bruits or organomegaly, bowel sounds nl  MS:  warm without deformities, calf tenderness, cyanosis or clubbing  SKIN: warm and dry without lesions    NEURO:  alert, approp, no deficits    CXR  11/07/2012 :  No active cardiopulmonary disease.      Assessment & Plan:

## 2012-11-23 NOTE — Patient Instructions (Signed)
Continue prilosec Take 30- 60 min before your first and last meals of the day until cough completely gone x 2 weeks without the need for cough medications at all  Return if cough comes back off prednisone.

## 2012-11-24 ENCOUNTER — Ambulatory Visit: Payer: Medicare Other | Admitting: Physical Therapy

## 2012-12-06 ENCOUNTER — Ambulatory Visit: Payer: Medicare Other | Attending: Internal Medicine | Admitting: Physical Therapy

## 2012-12-06 DIAGNOSIS — IMO0001 Reserved for inherently not codable concepts without codable children: Secondary | ICD-10-CM | POA: Diagnosis not present

## 2012-12-06 DIAGNOSIS — M545 Low back pain, unspecified: Secondary | ICD-10-CM | POA: Insufficient documentation

## 2012-12-06 DIAGNOSIS — M542 Cervicalgia: Secondary | ICD-10-CM | POA: Diagnosis not present

## 2012-12-08 ENCOUNTER — Ambulatory Visit: Payer: Medicare Other | Admitting: Physical Therapy

## 2012-12-13 ENCOUNTER — Ambulatory Visit: Payer: Medicare Other | Admitting: Physical Therapy

## 2012-12-15 ENCOUNTER — Ambulatory Visit: Payer: Medicare Other | Admitting: Physical Therapy

## 2013-01-11 ENCOUNTER — Other Ambulatory Visit: Payer: Self-pay | Admitting: Internal Medicine

## 2013-01-19 DIAGNOSIS — R39198 Other difficulties with micturition: Secondary | ICD-10-CM | POA: Diagnosis not present

## 2013-01-19 DIAGNOSIS — R339 Retention of urine, unspecified: Secondary | ICD-10-CM | POA: Diagnosis not present

## 2013-01-19 DIAGNOSIS — R350 Frequency of micturition: Secondary | ICD-10-CM | POA: Diagnosis not present

## 2013-01-19 DIAGNOSIS — N400 Enlarged prostate without lower urinary tract symptoms: Secondary | ICD-10-CM | POA: Diagnosis not present

## 2013-03-28 ENCOUNTER — Encounter: Payer: Self-pay | Admitting: Podiatry

## 2013-03-28 ENCOUNTER — Ambulatory Visit: Payer: Self-pay | Admitting: Podiatry

## 2013-03-28 ENCOUNTER — Ambulatory Visit (INDEPENDENT_AMBULATORY_CARE_PROVIDER_SITE_OTHER): Payer: Medicare Other | Admitting: Podiatry

## 2013-03-28 VITALS — BP 138/69 | HR 62 | Resp 17 | Ht 65.0 in | Wt 135.0 lb

## 2013-03-28 DIAGNOSIS — M79609 Pain in unspecified limb: Secondary | ICD-10-CM | POA: Diagnosis not present

## 2013-03-28 DIAGNOSIS — B351 Tinea unguium: Secondary | ICD-10-CM

## 2013-03-28 NOTE — Progress Notes (Signed)
   Subjective:    Patient ID: [name], male    DOB: [birth year], 80 y.o.   MRN: [id number] Pt presents for debridement of B/L 1 - 5 toenails. HPI    Review of Systems  All other systems reviewed and are negative.       Objective:   Physical Exam: I have reviewed his past medical history medications allergies surgeries and social history. Pulses are strong and palpable bilateral neurologic sensorium is intact. Orthopedic evaluation demonstrates hammertoe deformity and hallux limitus bilateral. Right greater than left is noted for these conditions. Nails are thick yellow dystrophic onychomycotic and painful palpation.        Assessment & Plan:  Assessment: He has a history of neuropathy and pain on ambulation limiting his ability to walk. His nails are thick yellow dystrophic clinically mycotic.  Plan: Discussed etiology pathology conservative versus surgical therapies. Debridement nails 1 through 5 bilateral is cover service secondary to pain and I will followup with him as needed appear

## 2013-03-31 DIAGNOSIS — R39198 Other difficulties with micturition: Secondary | ICD-10-CM | POA: Diagnosis not present

## 2013-03-31 DIAGNOSIS — R350 Frequency of micturition: Secondary | ICD-10-CM | POA: Diagnosis not present

## 2013-03-31 DIAGNOSIS — N529 Male erectile dysfunction, unspecified: Secondary | ICD-10-CM | POA: Diagnosis not present

## 2013-03-31 DIAGNOSIS — R3915 Urgency of urination: Secondary | ICD-10-CM | POA: Diagnosis not present

## 2013-04-05 ENCOUNTER — Telehealth: Payer: Self-pay

## 2013-04-05 NOTE — Telephone Encounter (Signed)
Purging the sample closet, patient has been called and made aware that he can pick up samples of Benicar 20 mg 1 po qd....  He voiced understanding and has agreed to come by and pick the samples up  KP

## 2013-04-11 ENCOUNTER — Ambulatory Visit: Payer: Self-pay | Admitting: Podiatry

## 2013-04-25 ENCOUNTER — Other Ambulatory Visit: Payer: Self-pay | Admitting: *Deleted

## 2013-04-25 MED ORDER — SIMVASTATIN 20 MG PO TABS: 10.0000 mg | ORAL_TABLET | Freq: Every day | ORAL | Status: DC

## 2013-05-30 ENCOUNTER — Telehealth: Payer: Self-pay | Admitting: Gastroenterology

## 2013-05-30 NOTE — Telephone Encounter (Signed)
Pt states he was seen in [place] for abdominal pain and had a CT scan done. Per pt the scan showed a gallstone and sludge in the gallbladder. Hemangioma was also seen in the liver. Pt states he was told that if he developed a fever he would need to go to the ER. Pt states he is coming home on June 8th and is requesting to be seen then.

## 2013-05-31 NOTE — Telephone Encounter (Signed)
Pt scheduled to see Amy Esterwood PA 06/15/13@10am . Pt aware of appt.

## 2013-06-01 ENCOUNTER — Telehealth: Payer: Self-pay | Admitting: Internal Medicine

## 2013-06-01 NOTE — Telephone Encounter (Signed)
Mr. Negro called and said you got disconnected.  You can reach him at [phone].

## 2013-06-01 NOTE — Telephone Encounter (Signed)
[name], pt's daughter, returned [name]'s call.  She said she just missed the call.  Her cell # is [phone]. Please call her on the cell.

## 2013-06-01 NOTE — Telephone Encounter (Signed)
Caller name: [place]  Call back [phone]   Reason for call:  Pt wants to have some clinical notes.  Had a hard time understanding what pt needed beyond that.  Please contact pt.

## 2013-06-01 NOTE — Telephone Encounter (Signed)
Spoke with pts dtr who requested the ROI form be faxed to [phone]

## 2013-06-01 NOTE — Telephone Encounter (Signed)
Spoke with patient who is requesting office notes for the past year. He is currently in [place] and has been hospitalized with abdominal pain. He states his insurance company is requesting these notes. I was asked to call the number provided # [phone] after 3pm so that they can switch it over to fax mode when they get home.

## 2013-06-02 NOTE — Telephone Encounter (Signed)
[name], patient's daughter called returning Sandra's phone call. Please advise  [phone]

## 2013-06-05 NOTE — Telephone Encounter (Signed)
Have sent Katharine Look a staff message to advise where she was on this.

## 2013-06-05 NOTE — Telephone Encounter (Signed)
Spoke with patient's daughter. Emailed blank form requested to [email or web address] Scheduled hospital follow up for inpatient stay in [place]

## 2013-06-05 NOTE — Telephone Encounter (Signed)
Patient called back to speak with Katharine Look. I informed patient that she was unenviable and patient insisted that he speak to someone else in our office today. Please advise.

## 2013-06-05 NOTE — Telephone Encounter (Signed)
I was unable to get the fax to go through because land line was never disconnected and fax line turned on.

## 2013-06-14 ENCOUNTER — Encounter: Payer: Self-pay | Admitting: Internal Medicine

## 2013-06-14 ENCOUNTER — Ambulatory Visit (INDEPENDENT_AMBULATORY_CARE_PROVIDER_SITE_OTHER): Payer: Medicare Other | Admitting: Internal Medicine

## 2013-06-14 ENCOUNTER — Ambulatory Visit (HOSPITAL_BASED_OUTPATIENT_CLINIC_OR_DEPARTMENT_OTHER)
Admission: RE | Admit: 2013-06-14 | Discharge: 2013-06-14 | Disposition: A | Discharge status: Home / Self Care | Payer: Medicare Other | Source: Ambulatory Visit | Attending: Internal Medicine | Admitting: Internal Medicine

## 2013-06-14 VITALS — BP 152/66 | HR 69 | Temp 98.2°F | Wt 124.0 lb

## 2013-06-14 DIAGNOSIS — K810 Acute cholecystitis: Secondary | ICD-10-CM

## 2013-06-14 DIAGNOSIS — J8410 Pulmonary fibrosis, unspecified: Secondary | ICD-10-CM | POA: Diagnosis not present

## 2013-06-14 DIAGNOSIS — J189 Pneumonia, unspecified organism: Secondary | ICD-10-CM

## 2013-06-14 DIAGNOSIS — I10 Essential (primary) hypertension: Secondary | ICD-10-CM | POA: Diagnosis not present

## 2013-06-14 DIAGNOSIS — R918 Other nonspecific abnormal finding of lung field: Secondary | ICD-10-CM | POA: Diagnosis not present

## 2013-06-14 LAB — CBC WITH DIFFERENTIAL/PLATELET
Basophils Absolute: 0 10*3/uL (ref 0.0–0.1)
Basophils Relative: 0.6 % (ref 0.0–3.0)
Eosinophils Absolute: 0.1 10*3/uL (ref 0.0–0.7)
Eosinophils Relative: 2.3 % (ref 0.0–5.0)
HCT: 38 % — ABNORMAL LOW (ref 39.0–52.0)
Hemoglobin: 13 g/dL (ref 13.0–17.0)
Lymphocytes Relative: 28.5 % (ref 12.0–46.0)
Lymphs Abs: 1.8 10*3/uL (ref 0.7–4.0)
MCHC: 34.2 g/dL (ref 30.0–36.0)
MCV: 88.5 fl (ref 78.0–100.0)
Monocytes Absolute: 0.7 10*3/uL (ref 0.1–1.0)
Monocytes Relative: 11.8 % (ref 3.0–12.0)
Neutro Abs: 3.5 10*3/uL (ref 1.4–7.7)
Neutrophils Relative %: 56.8 % (ref 43.0–77.0)
Platelets: 196 10*3/uL (ref 150.0–400.0)
RBC: 4.3 Mil/uL (ref 4.22–5.81)
RDW: 17.5 % — ABNORMAL HIGH (ref 11.5–15.5)
WBC: 6.2 10*3/uL (ref 4.0–10.5)

## 2013-06-14 LAB — COMPREHENSIVE METABOLIC PANEL
ALT: 16 U/L (ref 0–53)
AST: 27 U/L (ref 0–37)
Albumin: 3.9 g/dL (ref 3.5–5.2)
Alkaline Phosphatase: 56 U/L (ref 39–117)
BUN: 14 mg/dL (ref 6–23)
CO2: 31 mEq/L (ref 19–32)
Calcium: 9.5 mg/dL (ref 8.4–10.5)
Chloride: 103 mEq/L (ref 96–112)
Creatinine, Ser: 0.9 mg/dL (ref 0.4–1.5)
GFR: 88.65 mL/min (ref >60.00)
Glucose, Bld: 132 mg/dL — ABNORMAL HIGH (ref 70–99)
Potassium: 5 mEq/L (ref 3.5–5.1)
Sodium: 138 mEq/L (ref 135–145)
Total Bilirubin: 1.7 mg/dL — ABNORMAL HIGH (ref 0.2–1.2)
Total Protein: 7 g/dL (ref 6.0–8.3)

## 2013-06-14 MED ORDER — LOSARTAN POTASSIUM 100 MG PO TABS: 100.0000 mg | ORAL_TABLET | Freq: Every day | ORAL | Status: DC

## 2013-06-14 MED ORDER — VALSARTAN 160 MG PO TABS: 160.0000 mg | ORAL_TABLET | Freq: Every day | ORAL | Status: DC

## 2013-06-14 NOTE — Assessment & Plan Note (Addendum)
Status post admission to a hospital in [place] , [place] ~ 19 May 2013 with mental status changes felt to be due to acute cholecystitis. Other diagnoses were hyponatremia, hypokalemia, question of the infiltrate in the chest x-ray. At this point he's doing well clinically. On exam the liver  seems to be slightly enlarged . Plan: Gallbladder ultrasound, labs, surgical referral. Also CXR Encourage to go to the ER if symptoms resurface (orders entered by GI)

## 2013-06-14 NOTE — Assessment & Plan Note (Addendum)
Used to be on Benicar He returned from [place] on [place] however at this point likes to change to valsartan. He is not taking HCTZ due to hyponatremia and hypokalemia. Plan: Switch to valsartan Check a CMP Followup in one month Checked ambulatory BPs

## 2013-06-14 NOTE — Patient Instructions (Signed)
Get your blood work before you leave   Check the  blood pressure 2 or 3 times a  week be sure it is between 110/60 and 140/85. Ideal blood pressure is 120/80. If it is consistently higher or lower, let me know  Next visit is for routine check up regards your  blood pressure   in 1 months No need to come back fasting Please make an appointment

## 2013-06-14 NOTE — Progress Notes (Signed)
Pre visit review using our clinic review tool, if applicable. No additional management support is needed unless otherwise documented below in the visit note. 

## 2013-06-14 NOTE — Progress Notes (Signed)
Subjective:    Patient ID: [name], male    DOB: July 30, 1926, 80 y.o.   MRN: [id number]  DOS:  06/14/2013 Type of  Visit: acute History: Patient was admitted to a hospital in [place] with mental status changes and fever. He also had right upper quadrant and abdominal discomfort. Sodium was 123. Ultrasound showed a thick gallbladder, a 2.2 cm calculus and  gallbladder sludge. White count was elevated, chest x-ray with a question of LLL infiltrate. CT head  x2 showed only chronic changes , a  LP was unsuccessful. The patient was treated with acyclovir (d/t MS changes)  and ceftriaxone , he improved. Urine culture negative, blood culture x2 negative at the time of discharge He was discharged on Cipro and Flagyl and recommended to see a surgeon. Hydrochlorothiazide was discontinued due to to the level of sodium and potassium.   ROS Since he left the hospital He is feeling better. finishED the antibiotics as prescribed. No further abdominal pain, nausea, vomiting or diarrhea. No fever or chills No cough or difficulty breathing. He still feels slightly weak and his appetite is now completely back.   Past Medical History  Diagnosis Date  . Allergic rhinitis   . GERD (gastroesophageal reflux disease)     w/ esoph stricture  . Diabetes mellitus   . Hyperlipidemia   . Hypertension   . BPH (benign prostatic hypertrophy)   . Osteoarthritis   . Stroke     CVA, per CT "old stroke"  . Internal hemorrhoids   . Anemia   . History of colonic polyps   . Polyneuropathy     NCS by neurology per pt 5/07  . Anxiety   . Allergic rhinitis     Past Surgical History  Procedure Laterality Date  . Total knee arthroplasty  05/2006    (R)  . Total knee arthroplasty  08/2006    (L)  . Hernia repair      bil inguinal- per pt L in 90s, R in the 80s  . Cataract extraction Bilateral 01/2009  . Cardiovascular stress test  02-2010    done in [place], had CP, +EKG changes, neg nuclear  imagins  .  Ercp w/ sphicterotomy      ????? Dr Deatra Ina    History   Social History  . Marital Status: Married    Spouse Name: N/A    Number of Children: 2  . Years of Education: N/A   Occupational History  . retired    Social History Main Topics  . Smoking status: Former Research scientist (life sciences)  . Smokeless tobacco: Former Systems developer    Quit date: 01/05/1961  . Alcohol Use: No  . Drug Use: No  . Sexual Activity: Not on file   Other Topics Concern  . Not on file   Social History Narrative   Retired, Married, original from [place], lives w/ wife     has two children, lost a son , has a daughter              Medication List       This list is accurate as of: 06/14/13 11:59 PM.  Always use your most recent med list.               amLODipine 5 MG tablet  Commonly known as:  NORVASC  TAKE 1 TABLET BY MOUTH DAILY     aspirin 81 MG tablet  Take 81 mg by mouth daily.     atenolol 50 MG tablet  Commonly known as:  TENORMIN  Take 0.5 tablets (25 mg total) by mouth daily.     finasteride 5 MG tablet  Commonly known as:  PROSCAR  Take 1 tablet (5 mg total) by mouth daily.     multivitamin per tablet  Take 1 tablet by mouth daily.     omeprazole 20 MG capsule  Commonly known as:  PRILOSEC  TAKE ONE CAPSULE BY MOUTH TWICE DAILY     simvastatin 20 MG tablet  Commonly known as:  ZOCOR  Take 0.5 tablets (10 mg total) by mouth at bedtime.     valsartan 160 MG tablet  Commonly known as:  DIOVAN  Take 1 tablet (160 mg total) by mouth daily.           Objective:   Physical Exam BP 152/66  Pulse 69  Temp(Src) 98.2 F (36.8 C)  Wt 124 lb (56.246 kg)  SpO2 98% General -- alert, well-developed, NAD.  Lungs -- normal respiratory effort, no intercostal retractions, no accessory muscle use, and normal breath sounds.  Heart-- normal rate, regular rhythm, no murmur.  Abdomen-- Not distended, good bowel sounds,soft, non-tender. Palpable liver below the right rib cage, not tender or  nodular. Extremities-- no pretibial edema bilaterally  Neurologic--  alert & oriented X3. Speech normal, gait appropriate for age, strength symmetric and appropriate for age.  Psych-- Cognition and judgment appear intact. Cooperative with normal attention span and concentration. No anxious or depressed appearing.     Assessment & Plan:

## 2013-06-15 ENCOUNTER — Encounter: Payer: Self-pay | Admitting: Physician Assistant

## 2013-06-15 ENCOUNTER — Telehealth: Payer: Self-pay | Admitting: *Deleted

## 2013-06-15 ENCOUNTER — Ambulatory Visit (INDEPENDENT_AMBULATORY_CARE_PROVIDER_SITE_OTHER): Payer: Medicare Other | Admitting: Physician Assistant

## 2013-06-15 VITALS — BP 150/62 | HR 64 | Ht 64.0 in | Wt 124.6 lb

## 2013-06-15 DIAGNOSIS — K810 Acute cholecystitis: Secondary | ICD-10-CM | POA: Diagnosis not present

## 2013-06-15 DIAGNOSIS — R1011 Right upper quadrant pain: Secondary | ICD-10-CM | POA: Diagnosis not present

## 2013-06-15 NOTE — Patient Instructions (Addendum)
We scheduled an Ultrasound at [facility]. Go to the  [place] entrance of the hospital to patient registration. Friday 6-12.Arrive at 8:15 am. Have nothing by mouth after midnight.  We will be calling you with an appointment with [facility].  We will fax them the records you brought [place] from [place].

## 2013-06-15 NOTE — Telephone Encounter (Signed)
Caller name:  Shashank Relation to pt:  self Call back number: [phone] Pharmacy:  Reason for call:   Pt called requesting lab results from 06/14/2013 and chest x-ray mailed to him after reviewed by [facility].  bw

## 2013-06-15 NOTE — Progress Notes (Signed)
Subjective:    Patient ID: [name], male    DOB: [birth year], 80 y.o.   MRN: [id number]  HPI [name] is a very nice 80 year old [place] male known previously to [name]. He comes in today for consult after recent hospital admission in [place] -at which time he was diagnosed with acute cholecystitis. Patient has history of choledocholithiasis and had undergone ERCP with sphincterotomy and stone extraction in April of 2010 per [name]. He also has history of hypertension, a hyperlipidemia, and GERD. Patient was visiting family in mid May when he developed abdominal pain nausea fever lethargy and confusion. He had ultrasound done at that time showing a thickened gallbladder wall up to 7 mm in the nonmobile 2.2 cm gallstone as well as gallbladder sludge. There was no pericholecystic fluid and no ductal dilation. He also had a sodium of 123 on admission felt secondary to underlying infection. This corrected. Blood cultures were negative. He was treated with IV antibiotics and his symptoms improved rather quickly. By 5/12 2015 he was allowed discharged with instructions to be seen by his regular physicians when he returned to the [place]. He was discharged on a course of Cipro and Flagyl which he is now completed. Patient was seen by Dr. [name] yesterday and had repeat labs done all of which were unremarkable including CBC and seem at. Total bilirubin was elevated at 1.7. Patient says that he feels much better, he has been a bit fatigued and says he is afraid to eat as he does not want pain to come back. He says he is hungry ,has no complaints of abdominal pain, nausea or vomiting or diarrhea. He has not had any recurrent fever or chills.    Review of Systems  Constitutional: Positive for appetite change and fatigue.  HENT: Negative.   Eyes: Negative.   Respiratory: Negative.   Cardiovascular: Negative.   Gastrointestinal: Negative.   Endocrine: Negative.   Genitourinary:  Negative.   Musculoskeletal: Negative.   Skin: Negative.   Allergic/Immunologic: Negative.   Neurological: Negative.   Hematological: Negative.   Psychiatric/Behavioral: Negative.    Outpatient Encounter Prescriptions as of 06/15/2013  Medication Sig  . amLODipine (NORVASC) 5 MG tablet TAKE 1 TABLET BY MOUTH DAILY  . aspirin 81 MG tablet Take 81 mg by mouth daily.    Marland Kitchen atenolol (TENORMIN) 50 MG tablet Take 0.5 tablets (25 mg total) by mouth daily.  . finasteride (PROSCAR) 5 MG tablet Take 1 tablet (5 mg total) by mouth daily.  . multivitamin (THERAGRAN) per tablet Take 1 tablet by mouth daily.    Marland Kitchen omeprazole (PRILOSEC) 20 MG capsule TAKE ONE CAPSULE BY MOUTH TWICE DAILY  . simvastatin (ZOCOR) 20 MG tablet Take 0.5 tablets (10 mg total) by mouth at bedtime.  . valsartan (DIOVAN) 160 MG tablet Take 1 tablet (160 mg total) by mouth daily.   Allergies  Allergen Reactions  . Amoxicillin Other (See Comments)    "loose motions" "giddiness"   Patient Active Problem List   Diagnosis Date Noted  . Cholecystitis, acute 06/14/2013  . Acute bronchitis 10/29/2012  . Medicare annual wellness visit, subsequent 05/10/2011  . FATIGUE 02/19/2009  . ANXIETY 03/26/2008  . Prediabetes 06/02/2007  . HYPERLIPIDEMIA 06/02/2007  . CVA 03/10/2007  . INTERNAL HEMORRHOIDS 03/10/2007  . DIZZINESS 11/04/2006  . ANEMIA NOS 08/24/2006  . OSTEOARTHRITIS 08/24/2006  . COLONIC POLYPS 06/28/2006  . HYPERTENSION 05/17/2006  . ALLERGIC RHINITIS 05/17/2006  . ESOPHAGEAL STRICTURE 05/17/2006  . GERD  05/17/2006  . BENIGN PROSTATIC HYPERTROPHY 05/17/2006   History  Substance Use Topics  . Smoking status: Former Research scientist (life sciences)  . Smokeless tobacco: Former Systems developer    Quit date: 01/05/1961  . Alcohol Use: No   family history includes Diabetes in his brother; Heart attack (age of onset: 4) in his brother; Liver cancer in his brother. There is no history of Colon cancer or Prostate cancer.    Objective:   Physical Exam  L. he male in no acute distress accompanied by his wife both very pleasant- blood pressure-150/62 pulse 64 height 5 foot 4 weight 124. HEENT; nontraumatic normocephalic EOMI PERRLA sclera anicteric, Supple; no JVD, Cardiovascular; regular rate and rhythm with S1-S2 no murmur or gallop, Pulmonary; clear bilaterally, Abdomen; soft nontender nondistended bowel sounds are active there is no palpable mass or hepatosplenomegaly, Rectal ;exam not done, Extremities; no clubbing cyanosis or edema skin warm and dry, Psych ;mood and affect appropriate       Assessment & Plan:  #57   80 year old [place] male with recent admission in [place] with acute cholecystitis. He also has cholelithiasis. Fortunately his symptoms have all resolved with a course of antibiotics and he is currently asymptomatic.  #2 hyponatremia -resolved , likely secondary to infection  #3 history of choledocholithiasis status post ERCP and stone extraction 2010  #4 hyperlipidemia  #5 hypertension  #6 remote CVA  #7 GERD  #8 history of colon polyps   Plan ; patient is encouraged to eat and to start with a bland low fat diet and to resume consumption of his usual amount of oral fluids  We'll schedule upper abdominal ultrasound  Refer to [facility] surgery for laparoscopic cholecystectomy  Continue omeprazole 20 mg by mouth every morning  Patient and wife advised  by [place] that should he develop recurrent abdominal pain and fever that he should proceed to the emergency room and would need admission .

## 2013-06-15 NOTE — Progress Notes (Signed)
Reviewed and agree with management. Robert D. Kaplan, M.D., FACG  

## 2013-06-16 ENCOUNTER — Encounter (INDEPENDENT_AMBULATORY_CARE_PROVIDER_SITE_OTHER): Payer: Self-pay | Admitting: Surgery

## 2013-06-16 ENCOUNTER — Telehealth: Payer: Self-pay | Admitting: Physician Assistant

## 2013-06-16 ENCOUNTER — Ambulatory Visit (HOSPITAL_COMMUNITY)
Admission: RE | Admit: 2013-06-16 | Discharge: 2013-06-16 | Disposition: A | Discharge status: Home / Self Care | Payer: Medicare Other | Source: Ambulatory Visit | Attending: Physician Assistant | Admitting: Physician Assistant

## 2013-06-16 DIAGNOSIS — K810 Acute cholecystitis: Secondary | ICD-10-CM

## 2013-06-16 DIAGNOSIS — K8020 Calculus of gallbladder without cholecystitis without obstruction: Secondary | ICD-10-CM | POA: Diagnosis not present

## 2013-06-16 DIAGNOSIS — R1011 Right upper quadrant pain: Secondary | ICD-10-CM

## 2013-06-16 NOTE — Telephone Encounter (Signed)
Spoke with patient and this was an old message.

## 2013-06-16 NOTE — Telephone Encounter (Signed)
Done . [name] with pt verbalized understanding.

## 2013-06-30 ENCOUNTER — Ambulatory Visit (INDEPENDENT_AMBULATORY_CARE_PROVIDER_SITE_OTHER): Payer: Medicare Other | Admitting: Surgery

## 2013-06-30 ENCOUNTER — Encounter (INDEPENDENT_AMBULATORY_CARE_PROVIDER_SITE_OTHER): Payer: Self-pay | Admitting: Surgery

## 2013-06-30 VITALS — BP 118/82 | HR 72 | Temp 97.8°F | Resp 14 | Ht 64.0 in | Wt 125.6 lb

## 2013-06-30 DIAGNOSIS — K8010 Calculus of gallbladder with chronic cholecystitis without obstruction: Secondary | ICD-10-CM

## 2013-06-30 NOTE — Progress Notes (Signed)
Patient ID: [name], male   DOB: Sep 25, 1926, 80 y.o.   MRN: [id number]  Chief Complaint  Patient presents with  . Cholelithiasis    new pt    HPI [name] is a 80 y.o. male.  Referred by Dr. Erskine Emery for recent acute cholecystitis with possible choledocholithiasis  PCP - Dr. Kathlene November  HPI This is an 80 year old male who is status post ERCP with sphincterotomy and stone removal in April 2010 by Dr. Erskine Emery. He was now referred for a cholecystectomy after that time. The patient was traveling in [place], [place], in May of this year when he developed abdominal pain, nausea, fever, and confusion. He was admitted to the hospital in [place] where he was found to have cholecystitis with a 2.2 cm gallstone and some gallbladder sludge. He was treated with intravenous antibiotics and his symptoms improved quickly. He was discharged home and return to the [place]. He was evaluated by Dr. Kelby Fam office and liver function tests showed a bilirubin of 1.7. Repeat ultrasound showed the large gallstone but also a mildly dilated common bile duct with the possibility of a common bile duct stone. Currently the patient is feeling much better. Appetite and bowel movements are normal. He is here to discuss elective cholecystectomy. Past Medical History  Diagnosis Date  . Allergic rhinitis   . GERD (gastroesophageal reflux disease)     w/ esoph stricture  . Diabetes mellitus   . Hyperlipidemia   . Hypertension   . BPH (benign prostatic hypertrophy)   . Osteoarthritis   . Stroke     CVA, per CT "old stroke"  . Internal hemorrhoids   . Anemia   . History of colonic polyps   . Polyneuropathy     NCS by neurology per pt 5/07  . Anxiety   . Allergic rhinitis     Past Surgical History  Procedure Laterality Date  . Total knee arthroplasty  05/2006    (R)  . Total knee arthroplasty  08/2006    (L)  . Hernia repair      bil inguinal- per pt L in 90s, R in the 80s   . Cataract extraction Bilateral 01/2009  . Cardiovascular stress test  02-2010    done in [place], had CP, +EKG changes, neg nuclear  imagins  . Ercp w/ sphicterotomy      ????? Dr Deatra Ina    Family History  Problem Relation Age of Onset  . Diabetes Brother   . Cancer Brother     liver  . Heart attack Brother 9  . Cancer Brother     liver  . Colon cancer Neg Hx   . Prostate cancer Neg Hx   . Liver cancer Brother     Social History History  Substance Use Topics  . Smoking status: Former Research scientist (life sciences)  . Smokeless tobacco: Former Systems developer    Quit date: 01/05/1961  . Alcohol Use: No    Allergies  Allergen Reactions  . Amoxicillin Other (See Comments)    "loose motions" "giddiness"    Current Outpatient Prescriptions  Medication Sig Dispense Refill  . amLODipine (NORVASC) 5 MG tablet TAKE 1 TABLET BY MOUTH DAILY  90 tablet  0  . aspirin 81 MG tablet Take 81 mg by mouth daily.        Marland Kitchen atenolol (TENORMIN) 50 MG tablet Take 0.5 tablets (25 mg total) by mouth daily.  45 tablet  3  . finasteride (PROSCAR) 5 MG tablet  Take 1 tablet (5 mg total) by mouth daily.  90 tablet  3  . multivitamin (THERAGRAN) per tablet Take 1 tablet by mouth daily.        Marland Kitchen omeprazole (PRILOSEC) 20 MG capsule TAKE ONE CAPSULE BY MOUTH TWICE DAILY  180 capsule  0  . simvastatin (ZOCOR) 20 MG tablet Take 0.5 tablets (10 mg total) by mouth at bedtime.  45 tablet  1  . valsartan (DIOVAN) 160 MG tablet Take 1 tablet (160 mg total) by mouth daily.  30 tablet  3   No current facility-administered medications for this visit.    Review of Systems Review of Systems  Constitutional: Negative for fever, chills and unexpected weight change.  HENT: Negative for congestion, hearing loss, sore throat, trouble swallowing and voice change.   Eyes: Negative for visual disturbance.  Respiratory: Negative for cough and wheezing.   Cardiovascular: Negative for chest pain, palpitations and leg swelling.  Gastrointestinal: Negative  for nausea, vomiting, abdominal pain, diarrhea, constipation, blood in stool, abdominal distention, anal bleeding and rectal pain.  Genitourinary: Negative for hematuria and difficulty urinating.  Musculoskeletal: Negative for arthralgias.  Skin: Negative for rash and wound.  Neurological: Negative for seizures, syncope, weakness and headaches.  Hematological: Negative for adenopathy. Does not bruise/bleed easily.  Psychiatric/Behavioral: Negative for confusion.    Blood pressure 118/82, pulse 72, temperature 97.8 F (36.6 C), temperature source Temporal, resp. rate 14, height 5\' 4"  (1.626 m), weight 125 lb 9.6 oz (56.972 kg).  Physical Exam Physical Exam WDWN in NAD HEENT:  EOMI, sclera anicteric Neck:  No masses, no thyromegaly Lungs:  CTA bilaterally; normal respiratory effort CV:  Regular rate and rhythm; no murmurs Abd:  +bowel sounds, soft, non-tender, no masses Ext:  Well-perfused; no edema Skin:  Warm, dry; no sign of jaundice  Data Reviewed Dg Chest 2 View  06/14/2013   CLINICAL DATA:  Previous left basilar infiltrate  EXAM: CHEST  2 VIEW  COMPARISON:  11/07/2012  FINDINGS: The heart size and mediastinal contours are within normal limits. Both lungs are clear. The visualized skeletal structures are unremarkable. Calcified granuloma is noted in the left mid lung. Aortic calcifications are noted.  IMPRESSION: No active cardiopulmonary disease.   Electronically Signed   By: Inez Catalina M.D.   On: 06/14/2013 15:31   US Abdomen Complete  06/16/2013   CLINICAL DATA:  Right upper quadrant pain  EXAM: ULTRASOUND ABDOMEN COMPLETE  COMPARISON:  ERCP 04/13/2008.  FINDINGS: Gallbladder:  Sludge noted within gallbladder. Gallstone and gallbladder neck region measures 1.9 cm. There is thickening of gallbladder wall up to 6 mm  Common bile duct:  Diameter: Measures 6.3 mm in diameter. There is question of CBD stone measures about 6.5 mm.  Liver:  No focal hepatic mass.  Mild intrahepatic  biliary ductal dilatation.  IVC:  No abnormality visualized.  Pancreas:  Visualized portion unremarkable.  Spleen:  Size and appearance within normal limits. Measures 7.5 cm in length.  Right Kidney:  Length: 10 cm length. Echogenicity within normal limits. No mass or hydronephrosis visualized.  Left Kidney:  Length: 10 cm length. Echogenicity within normal limits. No mass or hydronephrosis visualized.  Abdominal aorta:  No aneurysm visualized.  Measures up 1.7 cm in diameter.  Other findings:  None.  IMPRESSION: 1. Sludge is noted within gallbladder. There is a gallstone in gallbladder neck region measures 1.9 cm. Thickening of gallbladder wall up to 6 mm. Cholecystitis cannot be excluded. There is question of CBD stone measures  6.5 mm. 2. Mild intrahepatic biliary ductal dilatation. 3. No hydronephrosis or diagnostic renal calculus. These results were called by telephone at the time of interpretation on 06/16/2013 at 8:57 AM to Dr. Warren Lacy [facility] nurse Rollene Fare , who verbally acknowledged these results.   Electronically Signed   By: Lahoma Crocker M.D.   On: 06/16/2013 08:58    Lab Results  Component Value Date   WBC 6.2 06/14/2013   HGB 13.0 06/14/2013   HCT 38.0* 06/14/2013   MCV 88.5 06/14/2013   PLT 196.0 06/14/2013   Lab Results  Component Value Date   CREATININE 0.9 06/14/2013   BUN 14 06/14/2013   NA 138 06/14/2013   K 5.0 06/14/2013   CL 103 06/14/2013   CO2 31 06/14/2013   Lab Results  Component Value Date   ALT 16 06/14/2013   AST 27 06/14/2013   ALKPHOS 56 06/14/2013   BILITOT 1.7* 06/14/2013     Assessment    Chronic calculus cholecystitis with possible choledocholithiasis and recent acute cholecystitis     Plan    Laparoscopic cholecystectomy with intraoperative cholangiogram. The surgical procedure has been discussed with the patient.  Potential risks, benefits, alternative treatments, and expected outcomes have been explained.  All of the patient's questions at this time have been  answered.  The likelihood of reaching the patient's treatment goal is good.  The patient understand the proposed surgical procedure and wishes to proceed. We discussed the need for possible repeat ERCP.  We will keep him overnight for observation.         TSUEI,MATTHEW K. 06/30/2013, 12:20 PM

## 2013-07-05 DIAGNOSIS — J310 Chronic rhinitis: Secondary | ICD-10-CM | POA: Diagnosis not present

## 2013-07-14 ENCOUNTER — Encounter: Payer: Self-pay | Admitting: Internal Medicine

## 2013-07-14 ENCOUNTER — Ambulatory Visit (INDEPENDENT_AMBULATORY_CARE_PROVIDER_SITE_OTHER): Payer: Medicare Other | Admitting: Internal Medicine

## 2013-07-14 VITALS — BP 145/72 | HR 65 | Temp 98.0°F | Wt 127.0 lb

## 2013-07-14 DIAGNOSIS — I10 Essential (primary) hypertension: Secondary | ICD-10-CM | POA: Diagnosis not present

## 2013-07-14 DIAGNOSIS — K8010 Calculus of gallbladder with chronic cholecystitis without obstruction: Secondary | ICD-10-CM | POA: Diagnosis not present

## 2013-07-14 LAB — BASIC METABOLIC PANEL
BUN: 12 mg/dL (ref 6–23)
CO2: 24 mEq/L (ref 19–32)
Calcium: 9.5 mg/dL (ref 8.4–10.5)
Chloride: 103 mEq/L (ref 96–112)
Creatinine, Ser: 0.8 mg/dL (ref 0.4–1.5)
GFR: 105.19 mL/min (ref >60.00)
Glucose, Bld: 125 mg/dL — ABNORMAL HIGH (ref 70–99)
Potassium: 4 mEq/L (ref 3.5–5.1)
Sodium: 137 mEq/L (ref 135–145)

## 2013-07-14 NOTE — Progress Notes (Signed)
Subjective:    Patient ID: [name], male    DOB: [birth year], 80 y.o.   MRN: [id number]  DOS:  07/14/2013 Type of visit - description:  Followup from previous visit History: Hypertension, good medication compliance,Ambulatory BPs 120, 140/70 GB, schedule for surgery  ROS  Denies nausea, vomiting. Appetite is improving,No abdominal pain. Wife did mention that he sleeps more frequently during the day but the patient states that he feels well, is more energetic and his strength is coming back.   Past Medical History  Diagnosis Date  . Allergic rhinitis   . GERD (gastroesophageal reflux disease)     w/ esoph stricture  . Diabetes mellitus   . Hyperlipidemia   . Hypertension   . BPH (benign prostatic hypertrophy)   . Osteoarthritis   . Stroke     CVA, per CT "old stroke"  . Internal hemorrhoids   . Anemia   . History of colonic polyps   . Polyneuropathy     NCS by neurology per pt 5/07  . Anxiety   . Allergic rhinitis     Past Surgical History  Procedure Laterality Date  . Total knee arthroplasty  05/2006    (R)  . Total knee arthroplasty  08/2006    (L)  . Hernia repair      bil inguinal- per pt L in 90s, R in the 80s  . Cataract extraction Bilateral 01/2009  . Cardiovascular stress test  02-2010    done in [place], had CP, +EKG changes, neg nuclear  imagins  . Ercp w/ sphicterotomy      ????? Dr Deatra Ina    History   Social History  . Marital Status: Married    Spouse Name: N/A    Number of Children: 2  . Years of Education: N/A   Occupational History  . retired    Social History Main Topics  . Smoking status: Former Research scientist (life sciences)  . Smokeless tobacco: Former Systems developer    Quit date: 01/05/1961  . Alcohol Use: No  . Drug Use: No  . Sexual Activity: Not on file   Other Topics Concern  . Not on file   Social History Narrative   Retired, Married, original from [place], lives w/ wife     has two children, lost a son , has a daughter              Medication  List       This list is accurate as of: 07/14/13 11:59 PM.  Always use your most recent med list.               amLODipine 5 MG tablet  Commonly known as:  NORVASC  TAKE 1 TABLET BY MOUTH DAILY     aspirin 81 MG tablet  Take 81 mg by mouth daily.     atenolol 50 MG tablet  Commonly known as:  TENORMIN  Take 0.5 tablets (25 mg total) by mouth daily.     finasteride 5 MG tablet  Commonly known as:  PROSCAR  Take 1 tablet (5 mg total) by mouth daily.     multivitamin per tablet  Take 1 tablet by mouth daily.     omeprazole 20 MG capsule  Commonly known as:  PRILOSEC  TAKE ONE CAPSULE BY MOUTH TWICE DAILY     simvastatin 20 MG tablet  Commonly known as:  ZOCOR  Take 0.5 tablets (10 mg total) by mouth at bedtime.     valsartan 160 MG  tablet  Commonly known as:  DIOVAN  Take 1 tablet (160 mg total) by mouth daily.           Objective:   Physical Exam BP 145/72  Pulse 65  Temp(Src) 98 F (36.7 C)  Wt 127 lb (57.607 kg)  SpO2 99% General -- alert, well-developed, NAD.   Lungs -- normal respiratory effort, no intercostal retractions, no accessory muscle use, and normal breath sounds.  Heart-- normal rate, regular rhythm, no murmur.   Extremities-- no pretibial edema bilaterally  Neurologic--  alert & oriented X3. Speech normal, gait appropriate for age, strength symmetric and appropriate for age.  Psych-- Cognition and judgment appear intact. Cooperative with normal attention span and concentration. No anxious or depressed appearing.        Assessment & Plan:

## 2013-07-14 NOTE — Progress Notes (Signed)
Pre visit review using our clinic review tool, if applicable. No additional management support is needed unless otherwise documented below in the visit note. 

## 2013-07-14 NOTE — Assessment & Plan Note (Signed)
Well-controlled on losartan, BMP today

## 2013-07-14 NOTE — Assessment & Plan Note (Signed)
80 year old gentleman with no cardiovascular complains or know CV disease scheduled gallbladder surgery

## 2013-07-14 NOTE — Patient Instructions (Addendum)
Get your blood work before you leave   Next visit is for a physical exam in 3-4 months , fasting Please make an appointment

## 2013-07-17 ENCOUNTER — Telehealth: Payer: Self-pay | Admitting: Internal Medicine

## 2013-07-17 ENCOUNTER — Encounter: Payer: Self-pay | Admitting: *Deleted

## 2013-07-17 NOTE — Telephone Encounter (Signed)
Caller name: Derryl Relation to pt: Call back [phone]   Reason for call:  Pt is wanting a return call to discuss some things.

## 2013-07-17 NOTE — Telephone Encounter (Signed)
Relevant patient education assigned to patient using Emmi. ° °

## 2013-07-18 ENCOUNTER — Telehealth: Payer: Self-pay | Admitting: Internal Medicine

## 2013-07-18 MED ORDER — VALSARTAN 160 MG PO TABS: 160.0000 mg | ORAL_TABLET | Freq: Every day | ORAL | Status: DC

## 2013-07-18 NOTE — Telephone Encounter (Signed)
[facility] pharmacy tech called to request a 90 day supply of valsartan (DIOVAN) 160 MG tablet. Please advise

## 2013-07-18 NOTE — Telephone Encounter (Signed)
Pt states had a appointment with DR.PAZ on 07/14/13 mentioned to DR.PAZ that he has productive cough and dry mouth during that visit. Pt states since that appointment he still has the productive cough and dry mouth and wants to know if he needs medication or chest xray. Please advise.

## 2013-07-18 NOTE — Telephone Encounter (Signed)
Done . rx sent  

## 2013-07-18 NOTE — Telephone Encounter (Signed)
Patient called again to check the status of his phone call yesterday. Please advise.

## 2013-07-18 NOTE — Telephone Encounter (Signed)
Advise pt: If  fever or chills or symptoms severe, arrange a office visit, overbook okay If symptoms are mild, take Robitussin-DM and Nasacort OTC 2 sprays in each side of the nose daily.

## 2013-07-18 NOTE — Telephone Encounter (Signed)
Pt called to see if [name] has said anything yet about what he advises pt to do about cough and dry mouth.  I advised pt Dr. [name] has had a full schedule seeing patients today and as soon as he got a chance, [name] would let us know what he recommends.  Pt understood.  bw

## 2013-07-19 NOTE — Telephone Encounter (Signed)
Advised pt of MD advice

## 2013-07-20 ENCOUNTER — Other Ambulatory Visit: Payer: Self-pay | Admitting: *Deleted

## 2013-07-20 MED ORDER — LOSARTAN POTASSIUM 100 MG PO TABS: 100.0000 mg | ORAL_TABLET | Freq: Every day | ORAL | Status: DC

## 2013-07-24 ENCOUNTER — Telehealth: Payer: Self-pay | Admitting: Physician Assistant

## 2013-07-24 ENCOUNTER — Telehealth: Payer: Self-pay | Admitting: Gastroenterology

## 2013-07-24 NOTE — Telephone Encounter (Signed)
Patient is asking for appointment or a telephone call from [name] prior to his surgery on 08/16/13. He states he thought [place], [place] was going to schedule an OV after his visit with her but this did not happen. He is having a lap chole on 08/16/13 and "wants to talk with his favorite doctor first. [name], would you call this patient. [phone])

## 2013-07-24 NOTE — Telephone Encounter (Signed)
Patient was recently evaluated in the office because of an episode of acute cholecystitis while he was traveling.  I reviewed the chart and the recent ultrasound which demonstrated gallbladder stones and told him that I concur with plans for surgery.  The patient requested that I speak with him prior to surgery which is what prompted the phone call.

## 2013-07-31 ENCOUNTER — Telehealth: Payer: Self-pay | Admitting: *Deleted

## 2013-07-31 ENCOUNTER — Telehealth: Payer: Self-pay | Admitting: Internal Medicine

## 2013-07-31 NOTE — Telephone Encounter (Signed)
Caller name: Keylen Relation to pt: Call back number:[phone]   Reason for call:   See message below and pl;ease call pt.

## 2013-07-31 NOTE — Telephone Encounter (Signed)
Same medication as ordered previously. 10mg  everyday. 20mg  tablet take 1/2 tab. Tried to call, busy number.

## 2013-07-31 NOTE — Telephone Encounter (Signed)
I'm scheduled to have [place] Bladder surgery next month.  I want to see how Dr. Milinda Pointer feels about this.  He told me I have Neuropathy.  Will this have any affect on my surgery?  I told him I would check with Dr. Milinda Pointer tomorrow and give him a call back.

## 2013-07-31 NOTE — Telephone Encounter (Signed)
I'd like to speak to nurse of Dr. Milinda Pointer regarding some surgery records.

## 2013-07-31 NOTE — Telephone Encounter (Signed)
Pt would like clarification on what simvastatin he is suppose to take.  He is confused to the change from 5mg  to 20mg .

## 2013-08-01 NOTE — Telephone Encounter (Signed)
Caller name: Raliegh Relation to LZ:JQBH Call back number: [phone] [facility]acy: [facility]  Reason for call: pt called back about the below message.

## 2013-08-01 NOTE — Telephone Encounter (Signed)
Attempted to call.   Mailbox is full.

## 2013-08-01 NOTE — Telephone Encounter (Signed)
In no way will neuropathy affect gall bladder surgery.

## 2013-08-01 NOTE — Telephone Encounter (Signed)
Spoke with patient and [facility]. Prescription is correct.

## 2013-08-01 NOTE — Telephone Encounter (Signed)
I called and left him a message that Dr. Milinda Pointer said that the Neuropathy should not affect the gall bladder surgery.  Call if you have any further questions.

## 2013-08-03 ENCOUNTER — Encounter (HOSPITAL_COMMUNITY): Payer: Self-pay | Admitting: Pharmacy Technician

## 2013-08-08 ENCOUNTER — Telehealth: Payer: Self-pay | Admitting: *Deleted

## 2013-08-08 NOTE — Telephone Encounter (Signed)
Caller name:  Zyire Relation to pt:  self Call back number:  [phone] Pharmacy:  Reason for call: Pt came by office this morning.  He states [employer] sent Dr. [name] a letter 07/21/2013 requesting additional information.  The claim number is [id number].  He does not have a copy of the letter.  He came by to make sure Dr. [name] has received the letter requesting the additional information.  Please advise

## 2013-08-08 NOTE — Pre-Procedure Instructions (Signed)
ERI PLATTEN  08/08/2013   Your procedure is scheduled on: Wednesday, August 16, 2013 at 8:30 AM  Report to [facility] Admitting at 6:30 AM.  Call this number if you have problems the morning of surgery: [phone]   Remember:   Do not eat food or drink liquids after midnight Tuesday, August 15, 2013   Take these medicines the morning of surgery with A SIP OF WATER: amLODipine (NORVASC),   atenolol (TENORMIN), finasteride (PROSCAR), omeprazole (PRILOSEC) Stop taking Aspirin, vitamins, and herbal medications. Do not take any NSAIDs ie: Ibuprofen, Advil, Naproxen or any medication containing Aspirin; stop now.   Do not wear jewelry, make-up or nail polish.  Do not wear lotions, powders, or perfumes. You may  NOT wear deodorant.  Do not shave 48 hours prior to surgery. Men may shave face and neck.  Do not bring valuables to the hospital.  [facility] is not responsible for any belongings or valuables.               Contacts, dentures or bridgework may not be worn into surgery.  Leave suitcase in the car. After surgery it may be brought to your room.  For patients admitted to the hospital, discharge time is determined by your treatment team.               Patients discharged the day of surgery will not be allowed to drive home.  Name and phone number of your driver:   Special Instructions:  Special Instructions:Special Instructions: [facility] - Preparing for Surgery  Before surgery, you can play an important role.  Because skin is not sterile, your skin needs to be as free of germs as possible.  You can reduce the number of germs on you skin by washing with CHG (chlorahexidine gluconate) soap before surgery.  CHG is an antiseptic cleaner which kills germs and bonds with the skin to continue killing germs even after washing.  Please DO NOT use if you have an allergy to CHG or antibacterial soaps.  If your skin becomes reddened/irritated stop using the CHG and inform your  nurse when you arrive at Short Stay.  Do not shave (including legs and underarms) for at least 48 hours prior to the first CHG shower.  You may shave your face.  Please follow these instructions carefully:   1.  Shower with CHG Soap the night before surgery and the morning of Surgery.  2.  If you choose to wash your hair, wash your hair first as usual with your normal shampoo.  3.  After you shampoo, rinse your hair and body thoroughly to remove the Shampoo.  4.  Use CHG as you would any other liquid soap.  You can apply chg directly  to the skin and wash gently with scrungie or a clean washcloth.  5.  Apply the CHG Soap to your body ONLY FROM THE NECK DOWN.  Do not use on open wounds or open sores.  Avoid contact with your eyes, ears, mouth and genitals (private parts).  Wash genitals (private parts) with your normal soap.  6.  Wash thoroughly, paying special attention to the area where your surgery will be performed.  7.  Thoroughly rinse your body with warm water from the neck down.  8.  DO NOT shower/wash with your normal soap after using and rinsing off the CHG Soap.  9.  Pat yourself dry with a clean towel.  10.  Wear clean pajamas.            11.  Place clean sheets on your bed the night of your first shower and do not sleep with pets.  Day of Surgery  Do not apply any lotions/deodorants the morning of surgery.  Please wear clean clothes to the hospital/surgery center.   Please read over the following fact sheets that you were given: Pain Booklet, Coughing and Deep Breathing and Surgical Site Infection Prevention

## 2013-08-09 ENCOUNTER — Encounter (HOSPITAL_COMMUNITY): Payer: Self-pay

## 2013-08-09 ENCOUNTER — Encounter (HOSPITAL_COMMUNITY)
Admission: RE | Admit: 2013-08-09 | Discharge: 2013-08-09 | Disposition: A | Discharge status: Home / Self Care | Payer: Medicare Other | Source: Ambulatory Visit | Attending: Surgery | Admitting: Surgery

## 2013-08-09 DIAGNOSIS — Z01812 Encounter for preprocedural laboratory examination: Secondary | ICD-10-CM | POA: Insufficient documentation

## 2013-08-09 DIAGNOSIS — Z01810 Encounter for preprocedural cardiovascular examination: Secondary | ICD-10-CM | POA: Insufficient documentation

## 2013-08-09 LAB — BASIC METABOLIC PANEL
Anion gap: 12 (ref 5–15)
BUN: 11 mg/dL (ref 6–23)
CO2: 26 mEq/L (ref 19–32)
Calcium: 8.9 mg/dL (ref 8.4–10.5)
Chloride: 100 mEq/L (ref 96–112)
Creatinine, Ser: 0.69 mg/dL (ref 0.50–1.35)
GFR calc Af Amer: >90 mL/min (ref >90)
GFR calc non Af Amer: 84 mL/min — ABNORMAL LOW (ref >90)
Glucose, Bld: 129 mg/dL — ABNORMAL HIGH (ref 70–99)
Potassium: 4 mEq/L (ref 3.7–5.3)
Sodium: 138 mEq/L (ref 137–147)

## 2013-08-09 LAB — CBC
HCT: 37.2 % — ABNORMAL LOW (ref 39.0–52.0)
Hemoglobin: 13.3 g/dL (ref 13.0–17.0)
MCH: 30.9 pg (ref 26.0–34.0)
MCHC: 35.8 g/dL (ref 30.0–36.0)
MCV: 86.3 fL (ref 78.0–100.0)
Platelets: 178 10*3/uL (ref 150–400)
RBC: 4.31 MIL/uL (ref 4.22–5.81)
RDW: 13.9 % (ref 11.5–15.5)
WBC: 6.3 10*3/uL (ref 4.0–10.5)

## 2013-08-09 NOTE — Progress Notes (Addendum)
Pt is here for pre-admit interview.  He is at the admitting desk now.

## 2013-08-09 NOTE — Progress Notes (Signed)
08/09/13 1036  OBSTRUCTIVE SLEEP APNEA  Have you ever been diagnosed with sleep apnea through a sleep study? No  Do you snore loudly (loud enough to be heard through closed doors)?  1  Do you often feel tired, fatigued, or sleepy during the daytime? 0  Has anyone observed you stop breathing during your sleep? 0  Do you have, or are you being treated for high blood pressure? 1  BMI more than 35 kg/m2? 0  Age over 78 years old? 1  Neck circumference greater than 40 cm/16 inches? 0  Gender: 1  Obstructive Sleep Apnea Score 4  Score 4 or greater  Results sent to PCP

## 2013-08-09 NOTE — Progress Notes (Signed)
PCP is Dr [name] Denies seeing a cardiologist. Denies having a recent CXR or EKG. Denies having a stress test, echo, or card cath.

## 2013-08-09 NOTE — Telephone Encounter (Signed)
i don't recall seen it but certainly will send any records they request as long as we have a ROI

## 2013-08-09 NOTE — Progress Notes (Signed)
Pt not here for pre-admit appointment.  Mobile and home numbers called.  I was unable to leave a message on mobile phone, but did leave a message for Mr Ke to call us back.

## 2013-08-14 ENCOUNTER — Encounter (INDEPENDENT_AMBULATORY_CARE_PROVIDER_SITE_OTHER): Payer: Medicare Other | Admitting: Surgery

## 2013-08-16 ENCOUNTER — Ambulatory Visit (HOSPITAL_COMMUNITY): Payer: Medicare Other | Admitting: Anesthesiology

## 2013-08-16 ENCOUNTER — Inpatient Hospital Stay (HOSPITAL_COMMUNITY)
Admission: RE | Admit: 2013-08-16 | Discharge: 2013-08-24 | DRG: 409 | Disposition: A | Discharge status: Home Health | Payer: Medicare Other | Source: Ambulatory Visit | Attending: Surgery | Admitting: Surgery

## 2013-08-16 ENCOUNTER — Ambulatory Visit (HOSPITAL_COMMUNITY): Payer: Medicare Other

## 2013-08-16 ENCOUNTER — Encounter (HOSPITAL_COMMUNITY): Payer: Self-pay | Admitting: *Deleted

## 2013-08-16 ENCOUNTER — Encounter (HOSPITAL_COMMUNITY): Payer: Medicare Other | Admitting: Anesthesiology

## 2013-08-16 ENCOUNTER — Encounter (HOSPITAL_COMMUNITY)
Admission: RE | Disposition: A | Discharge status: Home Health | Payer: Self-pay | Source: Ambulatory Visit | Attending: Surgery

## 2013-08-16 DIAGNOSIS — Z9849 Cataract extraction status, unspecified eye: Secondary | ICD-10-CM

## 2013-08-16 DIAGNOSIS — Z7982 Long term (current) use of aspirin: Secondary | ICD-10-CM

## 2013-08-16 DIAGNOSIS — Z79899 Other long term (current) drug therapy: Secondary | ICD-10-CM

## 2013-08-16 DIAGNOSIS — K833 Fistula of bile duct: Secondary | ICD-10-CM | POA: Diagnosis not present

## 2013-08-16 DIAGNOSIS — G609 Hereditary and idiopathic neuropathy, unspecified: Secondary | ICD-10-CM | POA: Diagnosis present

## 2013-08-16 DIAGNOSIS — Z96659 Presence of unspecified artificial knee joint: Secondary | ICD-10-CM | POA: Diagnosis not present

## 2013-08-16 DIAGNOSIS — R740 Nonspecific elevation of levels of transaminase and lactic acid dehydrogenase [LDH]: Secondary | ICD-10-CM

## 2013-08-16 DIAGNOSIS — I10 Essential (primary) hypertension: Secondary | ICD-10-CM | POA: Diagnosis present

## 2013-08-16 DIAGNOSIS — K219 Gastro-esophageal reflux disease without esophagitis: Secondary | ICD-10-CM | POA: Diagnosis present

## 2013-08-16 DIAGNOSIS — Z8673 Personal history of transient ischemic attack (TIA), and cerebral infarction without residual deficits: Secondary | ICD-10-CM | POA: Diagnosis not present

## 2013-08-16 DIAGNOSIS — R7402 Elevation of levels of lactic acid dehydrogenase (LDH): Secondary | ICD-10-CM

## 2013-08-16 DIAGNOSIS — Z5331 Laparoscopic surgical procedure converted to open procedure: Secondary | ICD-10-CM | POA: Diagnosis not present

## 2013-08-16 DIAGNOSIS — E785 Hyperlipidemia, unspecified: Secondary | ICD-10-CM | POA: Diagnosis present

## 2013-08-16 DIAGNOSIS — K8010 Calculus of gallbladder with chronic cholecystitis without obstruction: Principal | ICD-10-CM | POA: Diagnosis present

## 2013-08-16 DIAGNOSIS — K810 Acute cholecystitis: Secondary | ICD-10-CM | POA: Diagnosis not present

## 2013-08-16 DIAGNOSIS — Y849 Medical procedure, unspecified as the cause of abnormal reaction of the patient, or of later complication, without mention of misadventure at the time of the procedure: Secondary | ICD-10-CM | POA: Diagnosis not present

## 2013-08-16 DIAGNOSIS — R7309 Other abnormal glucose: Secondary | ICD-10-CM | POA: Diagnosis present

## 2013-08-16 DIAGNOSIS — Z87891 Personal history of nicotine dependence: Secondary | ICD-10-CM

## 2013-08-16 DIAGNOSIS — T8140XA Infection following a procedure, unspecified, initial encounter: Secondary | ICD-10-CM | POA: Diagnosis not present

## 2013-08-16 DIAGNOSIS — K823 Fistula of gallbladder: Secondary | ICD-10-CM

## 2013-08-16 DIAGNOSIS — D649 Anemia, unspecified: Secondary | ICD-10-CM | POA: Diagnosis not present

## 2013-08-16 DIAGNOSIS — K8042 Calculus of bile duct with acute cholecystitis without obstruction: Secondary | ICD-10-CM | POA: Diagnosis not present

## 2013-08-16 DIAGNOSIS — R7401 Elevation of levels of liver transaminase levels: Secondary | ICD-10-CM | POA: Diagnosis not present

## 2013-08-16 DIAGNOSIS — K2100 Gastro-esophageal reflux disease with esophagitis, without bleeding: Secondary | ICD-10-CM | POA: Diagnosis not present

## 2013-08-16 DIAGNOSIS — Z48815 Encounter for surgical aftercare following surgery on the digestive system: Secondary | ICD-10-CM | POA: Diagnosis not present

## 2013-08-16 DIAGNOSIS — K8000 Calculus of gallbladder with acute cholecystitis without obstruction: Secondary | ICD-10-CM | POA: Diagnosis not present

## 2013-08-16 DIAGNOSIS — Z4801 Encounter for change or removal of surgical wound dressing: Secondary | ICD-10-CM | POA: Diagnosis not present

## 2013-08-16 HISTORY — PX: CHOLECYSTECTOMY: SHX55

## 2013-08-16 LAB — CBC
HCT: 34.6 % — ABNORMAL LOW (ref 39.0–52.0)
Hemoglobin: 12.4 g/dL — ABNORMAL LOW (ref 13.0–17.0)
MCH: 30.4 pg (ref 26.0–34.0)
MCHC: 35.8 g/dL (ref 30.0–36.0)
MCV: 84.8 fL (ref 78.0–100.0)
Platelets: 187 10*3/uL (ref 150–400)
RBC: 4.08 MIL/uL — ABNORMAL LOW (ref 4.22–5.81)
RDW: 13.7 % (ref 11.5–15.5)
WBC: 14.4 10*3/uL — ABNORMAL HIGH (ref 4.0–10.5)

## 2013-08-16 LAB — GLUCOSE, CAPILLARY
Glucose-Capillary: 132 mg/dL — ABNORMAL HIGH (ref 70–99)
Glucose-Capillary: 181 mg/dL — ABNORMAL HIGH (ref 70–99)

## 2013-08-16 LAB — CREATININE, SERUM
Creatinine, Ser: 0.63 mg/dL (ref 0.50–1.35)
GFR calc Af Amer: >90 mL/min (ref >90)
GFR calc non Af Amer: 87 mL/min — ABNORMAL LOW (ref >90)

## 2013-08-16 SURGERY — CHOLECYSTECTOMY
Anesthesia: General | Site: Abdomen

## 2013-08-16 MED ORDER — PROPOFOL 10 MG/ML IV BOLUS
INTRAVENOUS | Status: AC
  Filled 2013-08-16: qty 20

## 2013-08-16 MED ORDER — PROPOFOL 10 MG/ML IV BOLUS
INTRAVENOUS | Status: DC | PRN
  Administered 2013-08-16: 150 mg via INTRAVENOUS

## 2013-08-16 MED ORDER — ROCURONIUM BROMIDE 50 MG/5ML IV SOLN
INTRAVENOUS | Status: AC
  Filled 2013-08-16: qty 1

## 2013-08-16 MED ORDER — MIDAZOLAM HCL 2 MG/2ML IJ SOLN
INTRAMUSCULAR | Status: AC
  Filled 2013-08-16: qty 2

## 2013-08-16 MED ORDER — NEOSTIGMINE METHYLSULFATE 10 MG/10ML IV SOLN
INTRAVENOUS | Status: AC
  Filled 2013-08-16: qty 1

## 2013-08-16 MED ORDER — LIDOCAINE HCL (CARDIAC) 20 MG/ML IV SOLN
INTRAVENOUS | Status: AC
  Filled 2013-08-16: qty 5

## 2013-08-16 MED ORDER — HYDROMORPHONE HCL PF 1 MG/ML IJ SOLN
INTRAMUSCULAR | Status: AC
  Filled 2013-08-16: qty 1

## 2013-08-16 MED ORDER — EPHEDRINE SULFATE 50 MG/ML IJ SOLN
INTRAMUSCULAR | Status: DC | PRN
  Administered 2013-08-16 (×2): 5 mg via INTRAVENOUS
  Administered 2013-08-16 (×3): 10 mg via INTRAVENOUS
  Administered 2013-08-16: 5 mg via INTRAVENOUS
  Administered 2013-08-16: 10 mg via INTRAVENOUS

## 2013-08-16 MED ORDER — MORPHINE SULFATE 2 MG/ML IJ SOLN
2.0000 mg | INTRAMUSCULAR | Status: DC | PRN
  Administered 2013-08-16 – 2013-08-19 (×11): 2 mg via INTRAVENOUS
  Filled 2013-08-16 (×11): qty 1

## 2013-08-16 MED ORDER — SIMVASTATIN 10 MG PO TABS
10.0000 mg | ORAL_TABLET | Freq: Every day | ORAL | Status: DC
  Administered 2013-08-16 – 2013-08-23 (×8): 10 mg via ORAL
  Filled 2013-08-16 (×13): qty 1

## 2013-08-16 MED ORDER — ROCURONIUM BROMIDE 100 MG/10ML IV SOLN
INTRAVENOUS | Status: DC | PRN
  Administered 2013-08-16: 10 mg via INTRAVENOUS
  Administered 2013-08-16: 35 mg via INTRAVENOUS
  Administered 2013-08-16: 5 mg via INTRAVENOUS

## 2013-08-16 MED ORDER — GLYCOPYRROLATE 0.2 MG/ML IJ SOLN
INTRAMUSCULAR | Status: DC | PRN
Start: 2013-08-16 — End: 2013-08-16
  Administered 2013-08-16: 0.6 mg via INTRAVENOUS

## 2013-08-16 MED ORDER — ONDANSETRON HCL 4 MG PO TABS: 4.0000 mg | ORAL_TABLET | Freq: Four times a day (QID) | ORAL | Status: DC | PRN

## 2013-08-16 MED ORDER — CHLORHEXIDINE GLUCONATE 4 % EX LIQD
1.0000 "application " | Freq: Once | CUTANEOUS | Status: DC
  Filled 2013-08-16: qty 15

## 2013-08-16 MED ORDER — GLYCOPYRROLATE 0.2 MG/ML IJ SOLN
INTRAMUSCULAR | Status: AC
  Filled 2013-08-16: qty 2

## 2013-08-16 MED ORDER — AMLODIPINE BESYLATE 5 MG PO TABS
5.0000 mg | ORAL_TABLET | Freq: Every day | ORAL | Status: DC
  Administered 2013-08-17 – 2013-08-24 (×8): 5 mg via ORAL
  Filled 2013-08-16 (×8): qty 1

## 2013-08-16 MED ORDER — CEFAZOLIN SODIUM-DEXTROSE 2-3 GM-% IV SOLR
2.0000 g | INTRAVENOUS | Status: AC
  Administered 2013-08-16: 2 g via INTRAVENOUS
  Filled 2013-08-16: qty 50

## 2013-08-16 MED ORDER — BUPIVACAINE-EPINEPHRINE (PF) 0.25% -1:200000 IJ SOLN
INTRAMUSCULAR | Status: AC
Start: 2013-08-16 — End: 2013-08-16
  Filled 2013-08-16: qty 30

## 2013-08-16 MED ORDER — IRBESARTAN 150 MG PO TABS
150.0000 mg | ORAL_TABLET | Freq: Every day | ORAL | Status: DC
  Administered 2013-08-16 – 2013-08-24 (×9): 150 mg via ORAL
  Filled 2013-08-16 (×9): qty 1

## 2013-08-16 MED ORDER — SODIUM CHLORIDE 0.9 % IV SOLN
INTRAVENOUS | Status: DC | PRN
  Administered 2013-08-16: 11:00:00

## 2013-08-16 MED ORDER — HYDROMORPHONE HCL PF 1 MG/ML IJ SOLN
0.2500 mg | INTRAMUSCULAR | Status: DC | PRN
  Administered 2013-08-16: 0.5 mg via INTRAVENOUS
  Administered 2013-08-16 (×2): 0.25 mg via INTRAVENOUS
  Administered 2013-08-16 (×3): 0.5 mg via INTRAVENOUS
  Administered 2013-08-16: 0.25 mg via INTRAVENOUS

## 2013-08-16 MED ORDER — ONDANSETRON HCL 4 MG/2ML IJ SOLN: 4.0000 mg | Freq: Four times a day (QID) | INTRAMUSCULAR | Status: DC | PRN

## 2013-08-16 MED ORDER — CEFAZOLIN SODIUM 1-5 GM-% IV SOLN
1.0000 g | Freq: Four times a day (QID) | INTRAVENOUS | Status: AC
  Administered 2013-08-16 – 2013-08-17 (×3): 1 g via INTRAVENOUS
  Filled 2013-08-16 (×3): qty 50

## 2013-08-16 MED ORDER — ONDANSETRON HCL 4 MG/2ML IJ SOLN
INTRAMUSCULAR | Status: AC
  Filled 2013-08-16: qty 2

## 2013-08-16 MED ORDER — ONDANSETRON HCL 4 MG/2ML IJ SOLN
INTRAMUSCULAR | Status: DC | PRN
  Administered 2013-08-16: 4 mg via INTRAVENOUS

## 2013-08-16 MED ORDER — POTASSIUM CHLORIDE IN NACL 20-0.9 MEQ/L-% IV SOLN
INTRAVENOUS | Status: DC
  Administered 2013-08-16 – 2013-08-19 (×4): via INTRAVENOUS
  Administered 2013-08-19: 1000 mL via INTRAVENOUS
  Administered 2013-08-20 – 2013-08-23 (×6): via INTRAVENOUS
  Filled 2013-08-16 (×15): qty 1000

## 2013-08-16 MED ORDER — FENTANYL CITRATE 0.05 MG/ML IJ SOLN
INTRAMUSCULAR | Status: AC
  Filled 2013-08-16: qty 5

## 2013-08-16 MED ORDER — OXYCODONE HCL 5 MG PO TABS: 5.0000 mg | ORAL_TABLET | Freq: Once | ORAL | Status: DC | PRN

## 2013-08-16 MED ORDER — LACTATED RINGERS IV SOLN
INTRAVENOUS | Status: DC | PRN
  Administered 2013-08-16 (×3): via INTRAVENOUS

## 2013-08-16 MED ORDER — ATENOLOL 25 MG PO TABS
25.0000 mg | ORAL_TABLET | Freq: Every day | ORAL | Status: DC
  Administered 2013-08-17 – 2013-08-24 (×8): 25 mg via ORAL
  Filled 2013-08-16 (×8): qty 1

## 2013-08-16 MED ORDER — OXYCODONE HCL 5 MG/5ML PO SOLN: 5.0000 mg | Freq: Once | ORAL | Status: DC | PRN

## 2013-08-16 MED ORDER — SODIUM CHLORIDE 0.9 % IR SOLN
Status: DC | PRN
  Administered 2013-08-16: 1000 mL

## 2013-08-16 MED ORDER — NEOSTIGMINE METHYLSULFATE 10 MG/10ML IV SOLN
INTRAVENOUS | Status: DC | PRN
  Administered 2013-08-16: 4 mg via INTRAVENOUS

## 2013-08-16 MED ORDER — BUPIVACAINE-EPINEPHRINE 0.25% -1:200000 IJ SOLN
INTRAMUSCULAR | Status: DC | PRN
  Administered 2013-08-16: 16 mL

## 2013-08-16 MED ORDER — FINASTERIDE 5 MG PO TABS
5.0000 mg | ORAL_TABLET | Freq: Every day | ORAL | Status: DC
  Administered 2013-08-17 – 2013-08-24 (×8): 5 mg via ORAL
  Filled 2013-08-16 (×8): qty 1

## 2013-08-16 MED ORDER — LIDOCAINE HCL (CARDIAC) 20 MG/ML IV SOLN
INTRAVENOUS | Status: DC | PRN
  Administered 2013-08-16: 100 mg via INTRAVENOUS

## 2013-08-16 MED ORDER — PROMETHAZINE HCL 25 MG/ML IJ SOLN: 6.2500 mg | INTRAMUSCULAR | Status: DC | PRN

## 2013-08-16 MED ORDER — FENTANYL CITRATE 0.05 MG/ML IJ SOLN
INTRAMUSCULAR | Status: DC | PRN
  Administered 2013-08-16 (×7): 50 ug via INTRAVENOUS

## 2013-08-16 MED ORDER — ENOXAPARIN SODIUM 40 MG/0.4ML ~~LOC~~ SOLN
40.0000 mg | SUBCUTANEOUS | Status: DC
  Administered 2013-08-17 – 2013-08-24 (×8): 40 mg via SUBCUTANEOUS
  Filled 2013-08-16 (×10): qty 0.4

## 2013-08-16 MED ORDER — 0.9 % SODIUM CHLORIDE (POUR BTL) OPTIME
TOPICAL | Status: DC | PRN
  Administered 2013-08-16: 2000 mL

## 2013-08-16 SURGICAL SUPPLY — 68 items
APPLIER CLIP ROT 10 11.4 M/L (STAPLE) ×3
BENZOIN TINCTURE PRP APPL 2/3 (GAUZE/BANDAGES/DRESSINGS) ×3 IMPLANT
BLADE SURG 10 STRL SS (BLADE) ×3 IMPLANT
BLADE SURG ROTATE 9660 (MISCELLANEOUS) ×6 IMPLANT
CANISTER SUCTION 2500CC (MISCELLANEOUS) ×3 IMPLANT
CATH REDDICK CHOLANGI 4FR 50CM (CATHETERS) ×3 IMPLANT
CHLORAPREP W/TINT 26ML (MISCELLANEOUS) ×3 IMPLANT
CLIP APPLIE ROT 10 11.4 M/L (STAPLE) ×2 IMPLANT
CLIP TI LARGE 6 (CLIP) ×3 IMPLANT
COVER MAYO STAND STRL (DRAPES) ×3 IMPLANT
COVER SURGICAL LIGHT HANDLE (MISCELLANEOUS) ×3 IMPLANT
DRAIN CHANNEL 19F RND (DRAIN) ×3 IMPLANT
DRAPE C-ARM 42X72 X-RAY (DRAPES) ×3 IMPLANT
DRAPE UTILITY 15X26 W/TAPE STR (DRAPE) ×6 IMPLANT
DRSG TEGADERM 2-3/8X2-3/4 SM (GAUZE/BANDAGES/DRESSINGS) ×9 IMPLANT
DRSG TEGADERM 4X4.75 (GAUZE/BANDAGES/DRESSINGS) ×3 IMPLANT
ELECT BLADE 6.5 EXT (BLADE) ×3 IMPLANT
ELECT CAUTERY BLADE 6.4 (BLADE) ×3 IMPLANT
ELECT REM PT RETURN 9FT ADLT (ELECTROSURGICAL) ×3
ELECTRODE REM PT RTRN 9FT ADLT (ELECTROSURGICAL) ×2 IMPLANT
EVACUATOR SILICONE 100CC (DRAIN) ×3 IMPLANT
FILTER SMOKE EVAC LAPAROSHD (FILTER) ×3 IMPLANT
GAUZE SPONGE 2X2 8PLY STRL LF (GAUZE/BANDAGES/DRESSINGS) ×2 IMPLANT
GLOVE BIO SURGEON STRL SZ 6 (GLOVE) ×3 IMPLANT
GLOVE BIO SURGEON STRL SZ 6.5 (GLOVE) ×6 IMPLANT
GLOVE BIO SURGEON STRL SZ7 (GLOVE) ×3 IMPLANT
GLOVE BIOGEL PI IND STRL 6.5 (GLOVE) ×2 IMPLANT
GLOVE BIOGEL PI IND STRL 7.0 (GLOVE) ×2 IMPLANT
GLOVE BIOGEL PI IND STRL 7.5 (GLOVE) ×4 IMPLANT
GLOVE BIOGEL PI INDICATOR 6.5 (GLOVE) ×1
GLOVE BIOGEL PI INDICATOR 7.0 (GLOVE) ×1
GLOVE BIOGEL PI INDICATOR 7.5 (GLOVE) ×2
GLOVE SURG SS PI 7.0 STRL IVOR (GLOVE) ×3 IMPLANT
GOWN STRL REUS W/ TWL LRG LVL3 (GOWN DISPOSABLE) ×10 IMPLANT
GOWN STRL REUS W/TWL LRG LVL3 (GOWN DISPOSABLE) ×5
KIT BASIN OR (CUSTOM PROCEDURE TRAY) ×3 IMPLANT
KIT ROOM TURNOVER OR (KITS) ×3 IMPLANT
NS IRRIG 1000ML POUR BTL (IV SOLUTION) ×3 IMPLANT
PAD ARMBOARD 7.5X6 YLW CONV (MISCELLANEOUS) ×3 IMPLANT
PENCIL BUTTON HOLSTER BLD 10FT (ELECTRODE) ×3 IMPLANT
POUCH SPECIMEN RETRIEVAL 10MM (ENDOMECHANICALS) ×3 IMPLANT
SCISSORS LAP 5X35 DISP (ENDOMECHANICALS) ×3 IMPLANT
SET CHOLANGIOGRAPH 5 50 .035 (SET/KITS/TRAYS/PACK) IMPLANT
SET IRRIG TUBING LAPAROSCOPIC (IRRIGATION / IRRIGATOR) ×3 IMPLANT
SLEEVE ENDOPATH XCEL 5M (ENDOMECHANICALS) ×6 IMPLANT
SPECIMEN JAR SMALL (MISCELLANEOUS) ×3 IMPLANT
SPONGE GAUZE 2X2 STER 10/PKG (GAUZE/BANDAGES/DRESSINGS) ×1
SPONGE GAUZE 4X4 12PLY STER LF (GAUZE/BANDAGES/DRESSINGS) ×3 IMPLANT
SPONGE LAP 18X18 X RAY DECT (DISPOSABLE) ×3 IMPLANT
SURGILUBE 2OZ TUBE FLIPTOP (MISCELLANEOUS) ×3 IMPLANT
SUT ETHILON 2 0 FS 18 (SUTURE) ×3 IMPLANT
SUT MNCRL AB 4-0 PS2 18 (SUTURE) ×3 IMPLANT
SUT PDS AB 1 TP1 96 (SUTURE) ×6 IMPLANT
SUT SILK 3 0 SH CR/8 (SUTURE) ×3 IMPLANT
SUT VIC AB 2-0 SH 27 (SUTURE) ×1
SUT VIC AB 2-0 SH 27XBRD (SUTURE) ×2 IMPLANT
SYR BULB IRRIGATION 50ML (SYRINGE) ×3 IMPLANT
TAPE CLOTH SURG 4X10 WHT LF (GAUZE/BANDAGES/DRESSINGS) ×3 IMPLANT
TAPE CLOTH SURG 6X10 WHT LF (GAUZE/BANDAGES/DRESSINGS) ×3 IMPLANT
TAPE UMBILICAL COTTON 1/8X30 (MISCELLANEOUS) ×3 IMPLANT
TOWEL OR 17X24 6PK STRL BLUE (TOWEL DISPOSABLE) ×3 IMPLANT
TOWEL OR 17X26 10 PK STRL BLUE (TOWEL DISPOSABLE) ×3 IMPLANT
TRAY LAPAROSCOPIC (CUSTOM PROCEDURE TRAY) ×3 IMPLANT
TROCAR XCEL BLUNT TIP 100MML (ENDOMECHANICALS) ×3 IMPLANT
TROCAR XCEL NON-BLD 11X100MML (ENDOMECHANICALS) ×3 IMPLANT
TROCAR XCEL NON-BLD 5MMX100MML (ENDOMECHANICALS) ×3 IMPLANT
TUBE CONNECTING 12X1/4 (SUCTIONS) ×3 IMPLANT
YANKAUER SUCT BULB TIP NO VENT (SUCTIONS) ×3 IMPLANT

## 2013-08-16 NOTE — Transfer of Care (Signed)
Immediate Anesthesia Transfer of Care Note  Patient: [name]  Procedure(s) Performed: Procedure(s): LAPAROSCOPIC CONVERTED TO OPEN  CHOLECYSTECTOMY WITH INTRAOPERATIVE CHOLANGIOGRAM (N/A)  Patient Location: PACU  Anesthesia Type:General  Level of Consciousness: awake, oriented, patient cooperative and responds to stimulation  Airway & Oxygen Therapy: Patient Spontanous Breathing and Patient connected to nasal cannula oxygen  Post-op Assessment: Report given to PACU RN, Post -op Vital signs reviewed and stable and Patient moving all extremities X 4  Post vital signs: Reviewed and stable  Complications: No apparent anesthesia complications

## 2013-08-16 NOTE — Progress Notes (Signed)
Report given to philip rn as caregiver 

## 2013-08-16 NOTE — Op Note (Addendum)
Laparoscopic Cholecystectomy/ converted to open cholecystectomy with IOC Procedure Note Takedown of cholecystoduodenal fistula  Indications: This patient presents with symptomatic gallbladder disease and will undergo laparoscopic cholecystectomy.  He has had a previous ERCP with CBD stones and jaundice in 2010 by Dr. Erskine Emery.  Recently, he was admitted to a hospital in [place] while on vacation and was found to have cholecystitis with a 2.2 cm gallstone.  His symptoms returned to normal.  He was then referred for surgical evaluation.  Pre-operative Diagnosis: Calculus of gallbladder with other cholecystitis, without mention of obstruction  Post-operative Diagnosis: Calculus of gallbladder with other cholecystitis; cholecystoduodenal fistula  Surgeon: TSUEI,MATTHEW K.   Assistants: Dr. Rolm Bookbinder  Anesthesia: General endotracheal anesthesia  ASA Class: 2  Procedure Details  The patient was seen again in the Holding Room. The risks, benefits, complications, treatment options, and expected outcomes were discussed with the patient. The possibilities of reaction to medication, pulmonary aspiration, perforation of viscus, bleeding, recurrent infection, finding a normal gallbladder, the need for additional procedures, failure to diagnose a condition, the possible need to convert to an open procedure, and creating a complication requiring transfusion or operation were discussed with the patient. The likelihood of improving the patient's symptoms with return to their baseline status is good.  The patient and/or family concurred with the proposed plan, giving informed consent. The site of surgery properly noted. The patient was taken to Operating Room, identified as [name] and the procedure verified as Laparoscopic Cholecystectomy with Intraoperative Cholangiogram. A Time Out was held and the above information confirmed.  Prior to the induction of general anesthesia, antibiotic  prophylaxis was administered. General endotracheal anesthesia was then administered and tolerated well. After the induction, the abdomen was prepped with Chloraprep and draped in the sterile fashion. The patient was positioned in the supine position.  Local anesthetic agent was injected into the skin near the umbilicus and an incision made. We dissected down to the abdominal fascia with blunt dissection.  The fascia was incised vertically and we entered the peritoneal cavity bluntly.  A pursestring suture of 0-Vicryl was placed around the fascial opening.  The Hasson cannula was inserted and secured with the stay suture.  Pneumoperitoneum was then created with CO2 and tolerated well without any adverse changes in the patient's vital signs. An 11-mm port was placed in the subxiphoid position.  Two 5-mm ports were placed in the right upper quadrant. All skin incisions were infiltrated with a local anesthetic agent before making the incision and placing the trocars.   We positioned the patient in reverse Trendelenburg, tilted slightly to the patient's left.  The gallbladder was identified, the fundus grasped and retracted cephalad. The gallbladder was very thickened and difficult to grasp.  There were dense adhesions to the fundus of the gallbladder.  Adhesions were lysed bluntly and with the electrocautery where indicated, taking care not to injure any adjacent organs or viscus. The duodenum seemed to be adherent to the medial side of the gallbladder.  We spent a considerable amount of time trying to carefully define the anatomy and to bluntly dissect the duodenum away from the gallbladder.  This was exceedingly difficult because of the thickened gallbladder.  We had difficulty identifying the cystic duct, so we decided to dissect the gallbladder away from the liver from the dome down.  We began dissecting between the gallbladder and the liver with cautery.  There was some significant bleeding that we were not  able to adequately visualize, so  we converted to an open procedure.  After retractors were placed, we were able to identify a bleeding vein in the liver that was controlled with clips.  We then identified a cholecystoduodenal fistula between the medial side of the gallbladder and the lateral side of the duodenum.  The fistula was divided and we were able to dissect the duodenum away from the gallbladder wall. The fistula in the side of the duodenum was then closed with multiple full-thickness 3-0 silk sutures. We then turned our attention back to the gallbladder. We continued dissecting down towards what appeared to be the hilum of the gallbladder. I attempted a cholangiogram by inserting a Reddick cholangiogram catheter into the gallbladder. We obtained fluoroscopic imaging wall contrast was being injected. However contrast did not flow into the cystic duct. It filled the gallbladder. We then made the decision to transect the gallbladder near the hilum but not at the cystic duct. Therefore we knew that we would be safe and away from the common bile duct. Cautery was used to amputate the gallbladder. The wall of the gallbladder is quite thickened. The stump of the gallbladder was then oversewn with 3-0 Vicryl. We inspected again for hemostasis. The abdomen was thoroughly irrigated with warm saline. A Jackson-Pratt drain was placed in the gallbladder fossa through a lateral port site. The umbilical fascia was closed with pursestring suture. The fascia of the right subcostal incision was closed with double-stranded #1 PDS suture. The subcutaneous tissues were irrigated and staples were used to close the skin. All the port sites were also closed with staples. A dry dressing was applied to the abdomen.  A Foley catheter was placed prior to extubation. The patient was then extubated and brought to the recovery room in stable condition. All sponge, instrument, and needle counts are correct.   Findings: Cholecystitis  with Cholelithiasis; cholecystoduodenal fistula  Estimated Blood Loss: 400 ml         Drains: JP drain         Specimens: Gallbladder           Complications: None; patient tolerated the procedure well.         Disposition: PACU - hemodynamically stable.         Condition: stable  Imogene Burn. Georgette Dover, MD, [facility] Surgery  General/ Trauma Surgery  08/16/2013 11:51 AM

## 2013-08-16 NOTE — Anesthesia Postprocedure Evaluation (Signed)
Anesthesia Post Note  Patient: [name]  Procedure(s) Performed: Procedure(s) (LRB): LAPAROSCOPIC CONVERTED TO OPEN  CHOLECYSTECTOMY WITH INTRAOPERATIVE CHOLANGIOGRAM (N/A)  Anesthesia type: general  Patient location: PACU  Post pain: Pain level controlled  Post assessment: Patient's Cardiovascular Status Stable  Last Vitals:  Filed Vitals:   08/16/13 1215  BP: 143/67  Pulse: 86  Temp:   Resp: 14    Post vital signs: Reviewed and stable  Level of consciousness: sedated  Complications: No apparent anesthesia complications

## 2013-08-16 NOTE — Anesthesia Preprocedure Evaluation (Signed)
Anesthesia Evaluation  Patient identified by MRN, date of birth, ID band Patient awake    Reviewed: Allergy & Precautions, H&P , NPO status , Patient's Chart, lab work & pertinent test results, reviewed documented beta blocker date and time   History of Anesthesia Complications Negative for: history of anesthetic complications  Airway Mallampati: II TM Distance: >3 FB Neck ROM: Full    Dental  (+) Teeth Intact, Dental Advisory Given   Pulmonary former smoker,    Pulmonary exam normal       Cardiovascular hypertension, Pt. on medications and Pt. on home beta blockers + Peripheral Vascular Disease     Neuro/Psych PSYCHIATRIC DISORDERS Anxiety CVA, No Residual Symptoms    GI/Hepatic GERD-  Medicated,  Endo/Other  diabetes  Renal/GU      Musculoskeletal   Abdominal   Peds  Hematology   Anesthesia Other Findings   Reproductive/Obstetrics                           Anesthesia Physical Anesthesia Plan  ASA: III  Anesthesia Plan: General   Post-op Pain Management:    Induction: Intravenous  Airway Management Planned: Oral ETT  Additional Equipment:   Intra-op Plan:   Post-operative Plan: Extubation in OR  Informed Consent: I have reviewed the patients History and Physical, chart, labs and discussed the procedure including the risks, benefits and alternatives for the proposed anesthesia with the patient or authorized representative who has indicated his/her understanding and acceptance.   Dental advisory given  Plan Discussed with: CRNA, Anesthesiologist and Surgeon  Anesthesia Plan Comments:         Anesthesia Quick Evaluation

## 2013-08-16 NOTE — H&P (Signed)
HPI  [name] is a 80 y.o. male. Referred by Dr. Erskine Emery for recent acute cholecystitis with possible choledocholithiasis  PCP - Dr. Kathlene November  HPI  This is an 80 year old male who is status post ERCP with sphincterotomy and stone removal in April 2010 by Dr. Erskine Emery. He was now referred for a cholecystectomy after that time. The patient was traveling in [place], [place], in May of this year when he developed abdominal pain, nausea, fever, and confusion. He was admitted to the hospital in [place] where he was found to have cholecystitis with a 2.2 cm gallstone and some gallbladder sludge. He was treated with intravenous antibiotics and his symptoms improved quickly. He was discharged home and return to the [place]. He was evaluated by Dr. Kelby Fam office and liver function tests showed a bilirubin of 1.7. Repeat ultrasound showed the large gallstone but also a mildly dilated common bile duct with the possibility of a common bile duct stone. Currently the patient is feeling much better. Appetite and bowel movements are normal. He is here to discuss elective cholecystectomy.  Past Medical History   Diagnosis  Date   .  Allergic rhinitis    .  GERD (gastroesophageal reflux disease)      w/ esoph stricture   .  Diabetes mellitus    .  Hyperlipidemia    .  Hypertension    .  BPH (benign prostatic hypertrophy)    .  Osteoarthritis    .  Stroke      CVA, per CT "old stroke"   .  Internal hemorrhoids    .  Anemia    .  History of colonic polyps    .  Polyneuropathy      NCS by neurology per pt 5/07   .  Anxiety    .  Allergic rhinitis     Past Surgical History   Procedure  Laterality  Date   .  Total knee arthroplasty   05/2006     (R)   .  Total knee arthroplasty   08/2006     (L)   .  Hernia repair       bil inguinal- per pt L in 90s, R in the 80s   .  Cataract extraction  Bilateral  01/2009   .  Cardiovascular stress test   02-2010     done in [place], had  CP, +EKG changes, neg nuclear imagins   .  Ercp w/ sphicterotomy       ????? Dr Deatra Ina    Family History   Problem  Relation  Age of Onset   .  Diabetes  Brother    .  Cancer  Brother      liver   .  Heart attack  Brother  29   .  Cancer  Brother      liver   .  Colon cancer  Neg Hx    .  Prostate cancer  Neg Hx    .  Liver cancer  Brother    Social History  History   Substance Use Topics   .  Smoking status:  Former Research scientist (life sciences)   .  Smokeless tobacco:  Former Systems developer     Quit date:  01/05/1961   .  Alcohol Use:  No    Allergies   Allergen  Reactions   .  Amoxicillin  Other (See Comments)     "loose motions" "giddiness"    Current Outpatient  Prescriptions   Medication  Sig  Dispense  Refill   .  amLODipine (NORVASC) 5 MG tablet  TAKE 1 TABLET BY MOUTH DAILY  90 tablet  0   .  aspirin 81 MG tablet  Take 81 mg by mouth daily.     Marland Kitchen  atenolol (TENORMIN) 50 MG tablet  Take 0.5 tablets (25 mg total) by mouth daily.  45 tablet  3   .  finasteride (PROSCAR) 5 MG tablet  Take 1 tablet (5 mg total) by mouth daily.  90 tablet  3   .  multivitamin (THERAGRAN) per tablet  Take 1 tablet by mouth daily.     Marland Kitchen  omeprazole (PRILOSEC) 20 MG capsule  TAKE ONE CAPSULE BY MOUTH TWICE DAILY  180 capsule  0   .  simvastatin (ZOCOR) 20 MG tablet  Take 0.5 tablets (10 mg total) by mouth at bedtime.  45 tablet  1   .  valsartan (DIOVAN) 160 MG tablet  Take 1 tablet (160 mg total) by mouth daily.  30 tablet  3    No current facility-administered medications for this visit.   Review of Systems  Review of Systems  Constitutional: Negative for fever, chills and unexpected weight change.  HENT: Negative for congestion, hearing loss, sore throat, trouble swallowing and voice change.  Eyes: Negative for visual disturbance.  Respiratory: Negative for cough and wheezing.  Cardiovascular: Negative for chest pain, palpitations and leg swelling.  Gastrointestinal: Negative for nausea, vomiting, abdominal pain,  diarrhea, constipation, blood in stool, abdominal distention, anal bleeding and rectal pain.  Genitourinary: Negative for hematuria and difficulty urinating.  Musculoskeletal: Negative for arthralgias.  Skin: Negative for rash and wound.  Neurological: Negative for seizures, syncope, weakness and headaches.  Hematological: Negative for adenopathy. Does not bruise/bleed easily.  Psychiatric/Behavioral: Negative for confusion.  Blood pressure 118/82, pulse 72, temperature 97.8 F (36.6 C), temperature source Temporal, resp. rate 14, height 5\' 4"  (1.626 m), weight 125 lb 9.6 oz (56.972 kg).  Physical Exam  Physical Exam  WDWN in NAD  HEENT: EOMI, sclera anicteric  Neck: No masses, no thyromegaly  Lungs: CTA bilaterally; normal respiratory effort  CV: Regular rate and rhythm; no murmurs  Abd: +bowel sounds, soft, non-tender, no masses  Ext: Well-perfused; no edema  Skin: Warm, dry; no sign of jaundice  Data Reviewed  Dg Chest 2 View  06/14/2013 CLINICAL DATA: Previous left basilar infiltrate EXAM: CHEST 2 VIEW COMPARISON: 11/07/2012 FINDINGS: The heart size and mediastinal contours are within normal limits. Both lungs are clear. The visualized skeletal structures are unremarkable. Calcified granuloma is noted in the left mid lung. Aortic calcifications are noted. IMPRESSION: No active cardiopulmonary disease. Electronically Signed By: Inez Catalina M.D. On: 06/14/2013 15:31  US Abdomen Complete  06/16/2013 CLINICAL DATA: Right upper quadrant pain EXAM: ULTRASOUND ABDOMEN COMPLETE COMPARISON: ERCP 04/13/2008. FINDINGS: Gallbladder: Sludge noted within gallbladder. Gallstone and gallbladder neck region measures 1.9 cm. There is thickening of gallbladder wall up to 6 mm Common bile duct: Diameter: Measures 6.3 mm in diameter. There is question of CBD stone measures about 6.5 mm. Liver: No focal hepatic mass. Mild intrahepatic biliary ductal dilatation. IVC: No abnormality visualized. Pancreas: Visualized  portion unremarkable. Spleen: Size and appearance within normal limits. Measures 7.5 cm in length. Right Kidney: Length: 10 cm length. Echogenicity within normal limits. No mass or hydronephrosis visualized. Left Kidney: Length: 10 cm length. Echogenicity within normal limits. No mass or hydronephrosis visualized. Abdominal aorta: No aneurysm visualized. Measures up  1.7 cm in diameter. Other findings: None. IMPRESSION: 1. Sludge is noted within gallbladder. There is a gallstone in gallbladder neck region measures 1.9 cm. Thickening of gallbladder wall up to 6 mm. Cholecystitis cannot be excluded. There is question of CBD stone measures 6.5 mm. 2. Mild intrahepatic biliary ductal dilatation. 3. No hydronephrosis or diagnostic renal calculus. These results were called by telephone at the time of interpretation on 06/16/2013 at 8:57 AM to Dr. Warren Lacy [facility] nurse Rollene Fare , who verbally acknowledged these results. Electronically Signed By: Lahoma Crocker M.D. 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  The surgical procedure has been discussed with the patient. Potential risks, benefits, alternative treatments, and expected outcomes have been explained. All of the patient's questions at this time have been answered. The likelihood of reaching the patient's treatment goal is good. The  patient understand the proposed surgical procedure and wishes to proceed.  We discussed the need for possible repeat ERCP. We will keep him overnight for observation.   Imogene Burn. Georgette Dover, MD, [facility] Surgery  General/ Trauma Surgery  08/16/2013 7:59 AM

## 2013-08-17 ENCOUNTER — Encounter (HOSPITAL_COMMUNITY): Payer: Self-pay | Admitting: Surgery

## 2013-08-17 LAB — CBC
HCT: 32.6 % — ABNORMAL LOW (ref 39.0–52.0)
Hemoglobin: 11.7 g/dL — ABNORMAL LOW (ref 13.0–17.0)
MCH: 31 pg (ref 26.0–34.0)
MCHC: 35.9 g/dL (ref 30.0–36.0)
MCV: 86.5 fL (ref 78.0–100.0)
Platelets: 176 10*3/uL (ref 150–400)
RBC: 3.77 MIL/uL — ABNORMAL LOW (ref 4.22–5.81)
RDW: 14 % (ref 11.5–15.5)
WBC: 15 10*3/uL — ABNORMAL HIGH (ref 4.0–10.5)

## 2013-08-17 LAB — COMPREHENSIVE METABOLIC PANEL
ALT: 86 U/L — ABNORMAL HIGH (ref 0–53)
AST: 109 U/L — ABNORMAL HIGH (ref 0–37)
Albumin: 3.1 g/dL — ABNORMAL LOW (ref 3.5–5.2)
Alkaline Phosphatase: 39 U/L (ref 39–117)
Anion gap: 15 (ref 5–15)
BUN: 15 mg/dL (ref 6–23)
CO2: 23 mEq/L (ref 19–32)
Calcium: 8.2 mg/dL — ABNORMAL LOW (ref 8.4–10.5)
Chloride: 98 mEq/L (ref 96–112)
Creatinine, Ser: 0.65 mg/dL (ref 0.50–1.35)
GFR calc Af Amer: >90 mL/min (ref >90)
GFR calc non Af Amer: 86 mL/min — ABNORMAL LOW (ref >90)
Glucose, Bld: 152 mg/dL — ABNORMAL HIGH (ref 70–99)
Potassium: 4.2 mEq/L (ref 3.7–5.3)
Sodium: 136 mEq/L — ABNORMAL LOW (ref 137–147)
Total Bilirubin: 1.3 mg/dL — ABNORMAL HIGH (ref 0.3–1.2)
Total Protein: 6 g/dL (ref 6.0–8.3)

## 2013-08-17 MED ORDER — ACETAMINOPHEN 325 MG PO TABS
650.0000 mg | ORAL_TABLET | ORAL | Status: DC | PRN
  Administered 2013-08-17: 650 mg via ORAL
  Filled 2013-08-17: qty 2

## 2013-08-17 MED ORDER — PANTOPRAZOLE SODIUM 40 MG PO PACK
40.0000 mg | PACK | Freq: Every day | ORAL | Status: DC
  Administered 2013-08-17 – 2013-08-20 (×4): 40 mg via ORAL
  Filled 2013-08-17 (×5): qty 20

## 2013-08-17 NOTE — Progress Notes (Signed)
1 Day Post-Op  Subjective: The patient is having some abdominal pain. Denies nausea - reports some flatus Foley just removed - missed his Proscar yesterday  Objective: Vital signs in last 24 hours: Temp:  [97 F (36.1 C)-98.5 F (36.9 C)] 98 F (36.7 C) (08/13 0523) Pulse Rate:  [60-100] 95 (08/13 0523) Resp:  [9-22] 18 (08/13 0523) BP: (129-168)/(50-78) 148/65 mmHg (08/13 0523) SpO2:  [95 %-99 %] 99 % (08/13 0523)    Intake/Output from previous day: 08/12 0701 - 08/13 0700 In: 3120 [I.V.:3120] Out: 2185 [Urine:1475; Emesis/NG output:220; Drains:90; Blood:400] Intake/Output this shift: Total I/O In: 660 [I.V.:660] Out: 280 [Urine:200; Emesis/NG output:80]  General appearance: alert, cooperative and no distress Resp: clear to auscultation bilaterally Cardio: regular rate and rhythm, S1, S2 normal, no murmur, click, rub or gallop GI: soft; occasional bowel sounds JP drain - non-bilious serosanguinous drainage; dressing dry  Lab Results:   Recent Labs  08/16/13 1605 08/17/13 0520  WBC 14.4* 15.0*  HGB 12.4* 11.7*  HCT 34.6* 32.6*  PLT 187 176   BMET  Recent Labs  08/16/13 1605 08/17/13 0520  NA  --  136*  K  --  4.2  CL  --  98  CO2  --  23  GLUCOSE  --  152*  BUN  --  15  CREATININE 0.63 0.65  CALCIUM  --  8.2*   PT/INR No results found for this basename: LABPROT, INR,  in the last 72 hours ABG No results found for this basename: PHART, PCO2, PO2, HCO3,  in the last 72 hours  Studies/Results: Dg Cholangiogram Operative  08/16/2013   CLINICAL DATA:  Cholecystitis  EXAM: INTRAOPERATIVE CHOLANGIOGRAM  TECHNIQUE: Cholangiographic images from the C-arm fluoroscopic device were submitted for interpretation post-operatively. Please see the procedural report for the amount of contrast and the fluoroscopy time utilized.  COMPARISON:  06/16/2013  FINDINGS: Intraoperative cholangiogram performed. This demonstrates opacification of the gallbladder with a large  intraluminal gallstone and intraluminal sludge. Contrast is not seen to pass into the cystic duct or common bile duct.  IMPRESSION: Limited intraoperative cholangiogram opacify the gallbladder. Large gallstone demonstrated. Intraluminal sludge as well. Biliary system is not opacified.   Electronically Signed   By: Daryll Brod M.D.   On: 08/16/2013 11:41    Anti-infectives: Anti-infectives   Start     Dose/Rate Route Frequency Ordered Stop   08/16/13 1600  ceFAZolin (ANCEF) IVPB 1 g/50 mL premix     1 g 100 mL/hr over 30 Minutes Intravenous Every 6 hours 08/16/13 1432 08/17/13 0505   08/16/13 0702  ceFAZolin (ANCEF) IVPB 2 g/50 mL premix     2 g 100 mL/hr over 30 Minutes Intravenous On call to O.R. 08/16/13 2025 08/16/13 4270      Assessment/Plan: s/p Procedure(s): LAPAROSCOPIC CONVERTED TO OPEN  CHOLECYSTECTOMY WITH INTRAOPERATIVE CHOLANGIOGRAM (N/A) Continue NG tube until bowel function returns Leave drain for now Mobilize   LOS: 1 day    TSUEI,MATTHEW K. 08/17/2013

## 2013-08-18 NOTE — Evaluation (Signed)
Physical Therapy Evaluation Patient Details Name: [name] MRN: [id number] DOB: [birth year] Today's Date: 08/18/2013   History of Present Illness  pt presents post open cholecystectomy.    Clinical Impression  Pt moves slowly, but with minimal A.  Pt needs encouragement for mobility and OOB time as he was only wanting to sit up for 29mins.  Pt would benefit from ambulation 3x/day with Nsg staff to increase activity tolerance and strength.  Will continue to follow along.      Follow Up Recommendations Home health PT;Supervision - Intermittent    Equipment Recommendations  Rolling walker with 5" wheels    Recommendations for Other Services       Precautions / Restrictions Precautions Precautions: Fall Precaution Comments: Abdominal Binder when up.  NG Tube.   Restrictions Weight Bearing Restrictions: No      Mobility  Bed Mobility Overal bed mobility: Needs Assistance Bed Mobility: Rolling;Sidelying to Sit Rolling: Min guard Sidelying to sit: Min assist       General bed mobility comments: cues for log roll technique to protect abdomen.    Transfers Overall transfer level: Needs assistance Equipment used: Rolling walker (2 wheeled) Transfers: Sit to/from Stand Sit to Stand: Min guard         General transfer comment: cues for UE use and controling descent to sitting.    Ambulation/Gait Ambulation/Gait assistance: Min guard Ambulation Distance (Feet): 100 Feet Assistive device: Rolling walker (2 wheeled) Gait Pattern/deviations: Step-through pattern;Decreased stride length;Trunk flexed   Gait velocity interpretation: Below normal speed for age/gender General Gait Details: pt moves slowly and with flexed posture.  cues for upright posture, positioning within RW, and encouragement.    Stairs            Wheelchair Mobility    Modified Rankin (Stroke Patients Only)       Balance Overall balance assessment: Needs assistance Sitting-balance  support: No upper extremity supported;Feet supported Sitting balance-Leahy Scale: Fair     Standing balance support: Single extremity supported Standing balance-Leahy Scale: Poor                               Pertinent Vitals/Pain Pain Assessment: 0-10 Pain Score: 3  Pain Location: Abdomen Pain Intervention(s): Premedicated before session;Repositioned    Home Living Family/patient expects to be discharged to:: Private residence Living Arrangements: Spouse/significant other Available Help at Discharge: Family;Available 24 hours/day Type of Home: House Home Access: Stairs to enter Entrance Stairs-Rails: Right Entrance Stairs-Number of Steps: 2-3 Home Layout: One level Home Equipment: None      Prior Function Level of Independence: Needs assistance      ADL's / Homemaking Assistance Needed: Family performs all homemaking tasks.          Hand Dominance        Extremity/Trunk Assessment   Upper Extremity Assessment: Generalized weakness           Lower Extremity Assessment: Generalized weakness      Cervical / Trunk Assessment: Kyphotic  Communication   Communication: HOH  Cognition Arousal/Alertness: Awake/alert Behavior During Therapy: WFL for tasks assessed/performed Overall Cognitive Status: Within Functional Limits for tasks assessed                      General Comments      Exercises        Assessment/Plan    PT Assessment Patient needs continued PT services  PT Diagnosis Difficulty  walking;Generalized weakness   PT Problem List Decreased strength;Decreased activity tolerance;Decreased balance;Decreased mobility;Decreased knowledge of use of DME  PT Treatment Interventions DME instruction;Gait training;Stair training;Functional mobility training;Therapeutic activities;Therapeutic exercise;Balance training;Patient/family education   PT Goals (Current goals can be found in the Care Plan section) Acute Rehab PT  Goals Patient Stated Goal: None stated.   PT Goal Formulation: With patient Time For Goal Achievement: 09/01/13 Potential to Achieve Goals: Good    Frequency Min 3X/week   Barriers to discharge        Co-evaluation               End of Session   Activity Tolerance: Patient tolerated treatment well Patient left: in chair;with call bell/phone within reach Nurse Communication: Mobility status         Time: 2423-5361 PT Time Calculation (min): 26 min   Charges:   PT Evaluation $Initial PT Evaluation Tier I: 1 Procedure PT Treatments $Gait Training: 8-22 mins $Therapeutic Activity: 8-22 mins   PT G CodesCatarina Hartshorn, [place] [phone] 08/18/2013, 1:50 PM

## 2013-08-18 NOTE — Progress Notes (Signed)
2 Days Post-Op  Subjective: Patient complaining of dry mouth Has been eating a lot of ice chips Minimal flatus Pain moderate - has been ambulating in the hallways with assistance  Objective: Vital signs in last 24 hours: Temp:  [97.8 F (36.6 C)-98.7 F (37.1 C)] 97.9 F (36.6 C) (08/14 0541) Pulse Rate:  [91-103] 103 (08/14 0541) Resp:  [16-18] 17 (08/14 0541) BP: (137-159)/(60-70) 148/67 mmHg (08/14 0541) SpO2:  [92 %-99 %] 94 % (08/14 0541)    Intake/Output from previous day: 08/13 0701 - 08/14 0700 In: 3482 [P.O.:360; I.V.:3122] Out: 2200 [Urine:1930; Emesis/NG output:230; Drains:40] Intake/Output this shift:    General appearance: alert, cooperative and no distress Resp: clear to auscultation bilaterally Cardio: regular rate and rhythm, S1, S2 normal, no murmur, click, rub or gallop GI: soft, incisional tenderness; hypoactive bowel sounds Incisions - staple lines c/d/i; drain with serosanguinous output; no sign of bile  Lab Results:   Recent Labs  08/16/13 1605 08/17/13 0520  WBC 14.4* 15.0*  HGB 12.4* 11.7*  HCT 34.6* 32.6*  PLT 187 176   BMET  Recent Labs  08/16/13 1605 08/17/13 0520  NA  --  136*  K  --  4.2  CL  --  98  CO2  --  23  GLUCOSE  --  152*  BUN  --  15  CREATININE 0.63 0.65  CALCIUM  --  8.2*   PT/INR No results found for this basename: LABPROT, INR,  in the last 72 hours ABG No results found for this basename: PHART, PCO2, PO2, HCO3,  in the last 72 hours  Studies/Results: Dg Cholangiogram Operative  08/16/2013   CLINICAL DATA:  Cholecystitis  EXAM: INTRAOPERATIVE CHOLANGIOGRAM  TECHNIQUE: Cholangiographic images from the C-arm fluoroscopic device were submitted for interpretation post-operatively. Please see the procedural report for the amount of contrast and the fluoroscopy time utilized.  COMPARISON:  06/16/2013  FINDINGS: Intraoperative cholangiogram performed. This demonstrates opacification of the gallbladder with a large  intraluminal gallstone and intraluminal sludge. Contrast is not seen to pass into the cystic duct or common bile duct.  IMPRESSION: Limited intraoperative cholangiogram opacify the gallbladder. Large gallstone demonstrated. Intraluminal sludge as well. Biliary system is not opacified.   Electronically Signed   By: Daryll Brod M.D.   On: 08/16/2013 11:41    Anti-infectives: Anti-infectives   Start     Dose/Rate Route Frequency Ordered Stop   08/16/13 1600  ceFAZolin (ANCEF) IVPB 1 g/50 mL premix     1 g 100 mL/hr over 30 Minutes Intravenous Every 6 hours 08/16/13 1432 08/17/13 0505   08/16/13 0702  ceFAZolin (ANCEF) IVPB 2 g/50 mL premix     2 g 100 mL/hr over 30 Minutes Intravenous On call to O.R. 08/16/13 7408 08/16/13 0832      Assessment/Plan: s/p Procedure(s): LAPAROSCOPIC CONVERTED TO OPEN  CHOLECYSTECTOMY WITH INTRAOPERATIVE CHOLANGIOGRAM (N/A) Awaiting bowel function prior to removal of NG tube Ice chips only Physical therapy - evaluate Encourage incentive spirometer Recheck labs in AM.  LOS: 2 days    TSUEI,MATTHEW K. 08/18/2013

## 2013-08-18 NOTE — Progress Notes (Signed)
PT Cancellation Note  Patient Details Name: JESPER STIREWALT MRN: [id number] DOB: 01-Nov-1926   Cancelled Treatment:     Family indicate pt just took IV pain meds and is now sleeping.  Will check back this pm.     [place], Thornton Papas 08/18/2013, 11:37 AM

## 2013-08-19 DIAGNOSIS — K823 Fistula of gallbladder: Secondary | ICD-10-CM

## 2013-08-19 LAB — COMPREHENSIVE METABOLIC PANEL
ALT: 40 U/L (ref 0–53)
AST: 55 U/L — ABNORMAL HIGH (ref 0–37)
Albumin: 2.8 g/dL — ABNORMAL LOW (ref 3.5–5.2)
Alkaline Phosphatase: 65 U/L (ref 39–117)
Anion gap: 17 — ABNORMAL HIGH (ref 5–15)
BUN: 17 mg/dL (ref 6–23)
CO2: 19 mEq/L (ref 19–32)
Calcium: 8.2 mg/dL — ABNORMAL LOW (ref 8.4–10.5)
Chloride: 97 mEq/L (ref 96–112)
Creatinine, Ser: 0.63 mg/dL (ref 0.50–1.35)
GFR calc Af Amer: >90 mL/min (ref >90)
GFR calc non Af Amer: 87 mL/min — ABNORMAL LOW (ref >90)
Glucose, Bld: 118 mg/dL — ABNORMAL HIGH (ref 70–99)
Potassium: 4.1 mEq/L (ref 3.7–5.3)
Sodium: 133 mEq/L — ABNORMAL LOW (ref 137–147)
Total Bilirubin: 2.5 mg/dL — ABNORMAL HIGH (ref 0.3–1.2)
Total Protein: 6.1 g/dL (ref 6.0–8.3)

## 2013-08-19 LAB — CBC
HCT: 30.3 % — ABNORMAL LOW (ref 39.0–52.0)
Hemoglobin: 10.8 g/dL — ABNORMAL LOW (ref 13.0–17.0)
MCH: 29.9 pg (ref 26.0–34.0)
MCHC: 35.6 g/dL (ref 30.0–36.0)
MCV: 83.9 fL (ref 78.0–100.0)
Platelets: 162 10*3/uL (ref 150–400)
RBC: 3.61 MIL/uL — ABNORMAL LOW (ref 4.22–5.81)
RDW: 13.6 % (ref 11.5–15.5)
WBC: 12.6 10*3/uL — ABNORMAL HIGH (ref 4.0–10.5)

## 2013-08-19 NOTE — Progress Notes (Signed)
General Surgery Note  LOS: 3 days  POD -  3 Days Post-Op PCP - [facility] Deatra Ina  Assessment/Plan: 1.  LAPAROSCOPIC CONVERTED TO OPEN  CHOLECYSTECTOMY WITH INTRAOPERATIVE CHOLANGIOGRAM, repair of duodenotomy - 08/16/2013 - M. Tsuei  For Cholecystoduodenal fistula  Passed flatus, no BM.  Will leave NGT for now  2.  Pre diabetes  Glucose - 133 - 08/19/2013 3.  HTN 4.  Hyperlipidemia 5.  BPH 6.  History of stroke 7.  Polyneuropathy 8.  Hgb - 10.8 - 08/19/2013  9.  DVT prophylaxis - Lovenox   Active Problems:   Cholecystoduodenal fistula  Subjective:  Did not sleep well.  Has passed flatus, but no BM. Objective:   Filed Vitals:   08/19/13 0539  BP: 147/58  Pulse: 92  Temp: 98.7 F (37.1 C)  Resp: 19     Intake/Output from previous day:  08/14 0701 - 08/15 0700 In: 2840 [P.O.:120; I.V.:2720] Out: 1975 [Urine:1800; Emesis/NG output:150; Drains:25]  Intake/Output this shift:      Physical Exam:   General: WN [place] M who is alert and oriented.    HEENT: Normal. Pupils equal.  Has NGT. Marland Kitchen   Lungs: Clear.  IS - 750 cc   Abdomen: Soft.  Has few BS.   Wound: Dressed.  Drain - 25 cc recorded last 24 hours.   Lab Results:    Recent Labs  08/17/13 0520 08/19/13 0105  WBC 15.0* 12.6*  HGB 11.7* 10.8*  HCT 32.6* 30.3*  PLT 176 162    BMET   Recent Labs  08/17/13 0520 08/19/13 0105  NA 136* 133*  K 4.2 4.1  CL 98 97  CO2 23 19  GLUCOSE 152* 118*  BUN 15 17  CREATININE 0.65 0.63  CALCIUM 8.2* 8.2*    PT/INR  No results found for this basename: LABPROT, INR,  in the last 72 hours  ABG  No results found for this basename: PHART, PCO2, PO2, HCO3,  in the last 72 hours   Studies/Results:  No results found.   Anti-infectives:   Anti-infectives   Start     Dose/Rate Route Frequency Ordered Stop   08/16/13 1600  ceFAZolin (ANCEF) IVPB 1 g/50 mL premix     1 g 100 mL/hr over 30 Minutes Intravenous Every 6 hours 08/16/13 1432 08/17/13 0505   08/16/13 0702   ceFAZolin (ANCEF) IVPB 2 g/50 mL premix     2 g 100 mL/hr over 30 Minutes Intravenous On call to O.R. 08/16/13 0702 08/16/13 1884      Alphonsa Overall, MD, FACS Pager: [place] [facility] Office: [phone] 08/19/2013

## 2013-08-19 NOTE — Consult Note (Signed)
[facility] Consult: 12:00 PM 08/19/2013  LOS: 3 days    Referring Provider: Dr Georgette Dover Primary Care Physician:  Kathlene November, MD Primary Gastroenterologist:  [name]     Reason for Consultation:  Rising bilirubin following complicated open cholecystectomy on 08/16/13   HPI: [name] HENKELS is a 80 y.o. male.  Hx of hypertension, hyperlipidemia, polyneuropathy, CVA, diet controlled DM2.  Hx GERD with esophageal stricture dilated in 2003.  Adenomatous polyps in colon in  2003 and 2008.    Underwent ERCP, sphincterotomy and stone extraction in 2010 by Dr Deatra Ina.  While vacationing in [place] in mid May, he developed acute cholecystitis.  Ultrasound showed thickened gallbladder wall up to 7 mm, nonmobile 2.2 cm gallstone as well as gallbladder sludge, noo pericholecystic fluid and no ductal dilation. He also had a sodium of 123 on admission felt secondary to underlying infection. This corrected. Blood cultures were negative. He was treated with IV antibiotics and his symptoms improved rather quickly. By 5/12 2015 he was discharged on course of Cipro and Flagyl with instructions to be seen by his regular physicians when he returned to the [place]. Seen in office 6/11 by GI PA Sutherland. Was doing well except  For fear of eating as he did not want to reinitiate sxs of abdominal pain. She referred pt for surgical eval.  Ultrasound repeated 6/12 and showed GB sludge, 51m GB wall, 6.3 mm CBD, ? 6.5 mm CBD stone, mild intrahepatic duct dilatation.  T bili was 1.7  On surgical visit with Dr TGeorgette Doverof 06/30/13 cholecystectomy was set for 8/12.  Pt ended up converting from laparoscopic to open cholecystectomy with introperative findings of cholecystoduodenal fistula,  dense adhesions with GB adherent to duodenum. Thickened GB.  Fistula was closed.  Chalngiogram with  "fluoroscopic imaging while contrast was being injected. However contrast did not flow into the cystic duct. It filled the gallbladder"  Since surgery T bili has gone from 1.3 on 8/13 to 2.5 today 8/15.  AST dropped during same interval from 109 to 55, ALT from 86 down to 55.  Clinically doing well, no pain meds used since yesterday.  No nausea but NGT still in place with pinkish, clear discharge.  It has drained 220 to 230 cc in previous 2 days, 150 cc so far today.  JP drainage is serosanguinous, 25cc yesterday, 40 cc 2 days ago and 90 cc 3 days ago. Pt is hungry. Gets po meds and ice chips.      Past Medical History  Diagnosis Date  . Allergic rhinitis   . GERD (gastroesophageal reflux disease)     w/ esoph stricture  . Diabetes mellitus   . Hyperlipidemia   . Hypertension   . BPH (benign prostatic hypertrophy)   . Osteoarthritis   . Stroke     CVA, per CT "old stroke"  . Internal hemorrhoids   . Anemia   . History of colonic polyps   . Polyneuropathy     NCS by neurology per pt 5/07  . Anxiety   . Allergic  rhinitis   . Neuropathy     Past Surgical History  Procedure Laterality Date  . Total knee arthroplasty  05/2006    (R)  . Total knee arthroplasty  08/2006    (L)  . Hernia repair      bil inguinal- per pt L in 90s, R in the 80s  . Cataract extraction Bilateral 01/2009  . Cardiovascular stress test  02-2010    done in [place], had CP, +EKG changes, neg nuclear  imagins  . Ercp w/ sphicterotomy      ????? Dr Deatra Ina  . Cholecystectomy N/A 08/16/2013    Procedure: LAPAROSCOPIC CONVERTED TO OPEN  CHOLECYSTECTOMY WITH INTRAOPERATIVE CHOLANGIOGRAM;  Surgeon: Imogene Burn. Georgette Dover, MD;  Location: [facility];  Service: General;  Laterality: N/A;    Prior to Admission medications   Medication Sig Start Date End Date Taking? Authorizing Provider  amLODipine (NORVASC) 5 MG tablet Take 5 mg by mouth daily.   Yes Historical Provider, MD  atenolol  (TENORMIN) 50 MG tablet Take 25 mg by mouth daily. 10/05/12  Yes Colon Branch, MD  finasteride (PROSCAR) 5 MG tablet Take 1 tablet (5 mg total) by mouth daily. 05/26/12  Yes Colon Branch, MD  omeprazole (PRILOSEC) 20 MG capsule Take 20 mg by mouth 2 (two) times daily.   Yes Historical Provider, MD  simvastatin (ZOCOR) 20 MG tablet Take 0.5 tablets (10 mg total) by mouth at bedtime. 04/25/13  Yes Colon Branch, MD  valsartan (DIOVAN) 160 MG tablet Take 1 tablet (160 mg total) by mouth daily. 07/18/13  Yes Colon Branch, MD  aspirin EC 81 MG tablet Take 81 mg by mouth daily.    Historical Provider, MD    Scheduled Meds: . amLODipine  5 mg Oral Daily  . atenolol  25 mg Oral Daily  . enoxaparin (LOVENOX) injection  40 mg Subcutaneous Q24H  . finasteride  5 mg Oral Daily  . irbesartan  150 mg Oral Daily  . pantoprazole sodium  40 mg Oral Daily  . simvastatin  10 mg Oral QHS   Infusions: . 0.9 % NaCl with KCl 20 mEq / L 75 mL/hr at 08/19/13 0937   PRN Meds: acetaminophen, morphine injection, ondansetron (ZOFRAN) IV, ondansetron   Allergies as of 07/05/2013 - Review Complete 06/30/2013  Allergen Reaction Noted  . Amoxicillin Other (See Comments) 05/31/2012    Family History  Problem Relation Age of Onset  . Diabetes Brother   . Cancer Brother     liver  . Heart attack Brother 19  . Cancer Brother     liver  . Colon cancer Neg Hx   . Prostate cancer Neg Hx   . Liver cancer Brother         Liver cancer in his oldest and youngest brothers, both died at 87.   History   Social History  . Marital Status: Married    Spouse Name: N/A    Number of Children: 2  . Years of Education: N/A   Occupational History  . Retired.  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     Social History Main Topics  . Smoking status: Former Research scientist (life sciences)  . Smokeless tobacco: Former Systems developer    Quit date: 01/05/1961  . Alcohol Use: None  . Drug Use: No  .  Sexual Activity: Not on file   Other Topics Concern  . Vegetarian, eats dairy but  not eggs, meat.    Social History Narrative   Retired, Married, original from [place], lives w/ wife     has two children, lost a son , has a daughter          REVIEW OF SYSTEMS: Constitutional:  Dropped 10 # with 05/2013 admission but gained it back steadily in ensuing weeks ENT:  No nose bleeds Pulm:  No sob or cough CV:  No palpitations, no LE edema.  GU:  No hematuria, no frequency.  No tea color urine GI:  Per HPI.  No dysphagia Heme:  No hx anemia   Transfusions:  None ever Neuro:  + pain and numbness in both legs and feet, makes it difficult to walk.  HOH, chooses not to wear his hearing aid.  Derm:  No itching, no rash or sores.  Endocrine:  No sweats or chills.  No polyuria or dysuria Immunization:  Not queried.  Travel:  None beyond local counties in last few months.    PHYSICAL EXAM: Vital signs in last 24 hours: Filed Vitals:   08/19/13 0539  BP: 147/58  Pulse: 92  Temp: 98.7 F (37.1 C)  Resp: 19   Wt Readings from Last 3 Encounters:  08/16/13 58.968 kg (130 lb)  08/16/13 58.968 kg (130 lb)  08/09/13 59.1 kg (130 lb 4.7 oz)    General: looks well and comfortable Head:  No swelling or asymmetry  Eyes:  No icterus or pallor Ears:  Not HOH  Nose:  No congestion or discharge.  Faint pink, clear NGT output Mouth:  Clear and moist.  Neck:  No mass, no TMG, no JVD Lungs:  Clear bil.  No cough or dyspnea Heart: RRR Abdomen:  Soft, ND, extensive bandages not removed and not bloody.  BS active.  Tender over surgical sites, no guard or rebound.  BS hypoactive.  JP drain with bloody material, nothing bilious looking..   Rectal: deferred   Musc/Skeltl: no joint swelling or pain Extremities:  No CCE  Neurologic:  Oriented  X 3.  No limb weakness.  HOH Skin:  No telangectasia, no sores or rash Tattoos:  none Nodes:  No cervical adenopathy.    Psych:  Pleasant,  cooperative  Intake/Output from previous day: 08/14 0701 - 08/15 0700 In: 2840 [P.O.:120; I.V.:2720] Out: 1975 [Urine:1800; Emesis/NG output:150; Drains:25] Intake/Output this shift:    LAB RESULTS:  Recent Labs  08/16/13 1605 08/17/13 0520 08/19/13 0105  WBC 14.4* 15.0* 12.6*  HGB 12.4* 11.7* 10.8*  HCT 34.6* 32.6* 30.3*  PLT 187 176 162   BMET Lab Results  Component Value Date   NA 133* 08/19/2013   NA 136* 08/17/2013   NA 138 08/09/2013   K 4.1 08/19/2013   K 4.2 08/17/2013   K 4.0 08/09/2013   CL 97 08/19/2013   CL 98 08/17/2013   CL 100 08/09/2013   CO2 19 08/19/2013   CO2 23 08/17/2013   CO2 26 08/09/2013   GLUCOSE 118* 08/19/2013   GLUCOSE 152* 08/17/2013   GLUCOSE 129* 08/09/2013   BUN 17 08/19/2013   BUN 15 08/17/2013   BUN 11 08/09/2013   CREATININE 0.63 08/19/2013   CREATININE 0.65 08/17/2013   CREATININE 0.63 08/16/2013   CALCIUM 8.2* 08/19/2013   CALCIUM 8.2* 08/17/2013   CALCIUM 8.9 08/09/2013   LFT  Recent Labs  08/17/13 0520 08/19/13 0105  PROT 6.0 6.1  ALBUMIN 3.1* 2.8*  AST 109* 55*  ALT 86* 40  ALKPHOS 39 65  BILITOT 1.3* 2.5*   PT/INR Lab Results  Component Value Date   INR 1.3 09/18/2006   INR 1.3 09/17/2006   INR 1.2 09/16/2006   Hepatitis Panel No results found for this basename: HEPBSAG, HCVAB, HEPAIGM, HEPBIGM,  in the last 72 hours Lipase     Component Value Date/Time   LIPASE 21.0 04/12/2008 1359      RADIOLOGY STUDIES: 08/16/13 INTRAOPERATIVE CHOLANGIOGRAM  FINDINGS:  Intraoperative cholangiogram performed. This demonstrates  opacification of the gallbladder with a large intraluminal gallstone  and intraluminal sludge. Contrast is not seen to pass into the  cystic duct or common bile duct.  IMPRESSION:  Limited intraoperative cholangiogram opacify the gallbladder. Large  gallstone demonstrated. Intraluminal sludge as well. Biliary system  is not opacified   06/16/13  ULTRASOUND ABDOMEN COMPLETE  COMPARISON: ERCP 04/13/2008.   FINDINGS:  Gallbladder:  Sludge noted within gallbladder. Gallstone and gallbladder neck  region measures 1.9 cm. There is thickening of gallbladder wall up  to 6 mm  Common bile duct:  Diameter: Measures 6.3 mm in diameter. There is question of CBD  stone measures about 6.5 mm.  Liver:  No focal hepatic mass. Mild intrahepatic biliary ductal dilatation.  IVC:  No abnormality visualized.  Pancreas:  Visualized portion unremarkable.  Spleen:  Size and appearance within normal limits. Measures 7.5 cm in length.  Right Kidney:  Length: 10 cm length. Echogenicity within normal limits. No mass or  hydronephrosis visualized.  Left Kidney:  Length: 10 cm length. Echogenicity within normal limits. No mass or  hydronephrosis visualized.  Abdominal aorta:  No aneurysm visualized. Measures up 1.7 cm in diameter.  Other findings:  None.  IMPRESSION:  1. Sludge is noted within gallbladder. There is a gallstone in  gallbladder neck region measures 1.9 cm. Thickening of gallbladder  wall up to 6 mm. Cholecystitis cannot be excluded. There is question  of CBD stone measures 6.5 mm.  2. Mild intrahepatic biliary ductal dilatation.  3. No hydronephrosis or diagnostic renal calculus.     ENDOSCOPIC STUDIES: 04/2008  ERCP with removal of stones INDICATIONS: suspected stone, abdominal pain, abnormal LFTs including jaundice FINDINGS:A stone was found in the common bile duct. At least 1 2-67m stone fragment distal portion of CBD. Multiple stone fragments were seen in the cystic duct.  Pancreatic duct was not injected Sphincterotomy was performed with a regular 20 mm pappillotome using guidewire technique. 153msphincterotomy was made. Single stone fragment was seen in the duodenum (see image001). Final Cholangiogram was normal The scope was then completely withdrawn from the patient and the procedure terminated. ENDOSCOPIC IMPRESSION:  1) Stone in the common bile duct  08/2006  Colonoscopy    For  2003  Hx of adenomatous  Polyps Findings: splenic  Flexure polyp.  Internal hemorrhoids Pathology:.  COLON, SPLENIC FLEXURE, POLYP, BIOPSY: TUBULAR ADENOMA. NO HIGH GRADE DYSPLASIA OR MALIGNANCY IDENTIFIED.  2003  EGD Dilated benign, peptic esophageal stricture.   2003  Colonoscopy Cecal polyp, internal  Hemorrhoids.     IMPRESSION:   *  Hx acute cholecystitis, managed medically in 05/2013.   Now S/p complicated open cholecystectomy 8/12.  Findings of cholecystoduodenal fistula which was closed.  Now with rising T Bili but declining transaminases and Alk Phos.     *  S/p ERCP, sphincterotomy and CBD stone extraction in 2013.  Suggestion of Choledocholithiasis on the ultrasound of 06/16/13 Unable to perform MRCP due to presence of artificial hip.   *  Anemia post op.  MCV  normal at 83.  Blood tinged NGT output and blood in JP drainage.   *  Hx adenomatous colon polyps in 2003 and 2008.  *  Hx esophageal dilation in 2003.  No dysphagia.     PLAN:     *  Observe for now.  Follow LFTs.  Remove NGT and feed pt as you feel he is ready.    Azucena Freed  08/19/2013, 12:00 PM Pager: [phone] Attending MD note:   I have taken a history, examined the patient, and reviewed the chart. I agree with the Advanced Practitioner's impression and recommendations. At this point no convincing evidence of biliary obstruction, will need to follow total/direct bilirubin over next 24-48 hours. Abdomen is soft, active bowl sounds. Consider sono/ HIDA (  off pain meds), From GI standpoint NG tube may be d/c'ed since bowl sounds active.Would be reluctant to do ERCP since he had a duodenostomy 3 days ago.  [facility] Pager # [phone]

## 2013-08-20 ENCOUNTER — Encounter (HOSPITAL_COMMUNITY): Payer: Self-pay

## 2013-08-20 DIAGNOSIS — R740 Nonspecific elevation of levels of transaminase and lactic acid dehydrogenase [LDH]: Secondary | ICD-10-CM

## 2013-08-20 DIAGNOSIS — R7402 Elevation of levels of lactic acid dehydrogenase (LDH): Secondary | ICD-10-CM

## 2013-08-20 LAB — CBC
HCT: 30 % — ABNORMAL LOW (ref 39.0–52.0)
Hemoglobin: 10.8 g/dL — ABNORMAL LOW (ref 13.0–17.0)
MCH: 30.3 pg (ref 26.0–34.0)
MCHC: 36 g/dL (ref 30.0–36.0)
MCV: 84 fL (ref 78.0–100.0)
Platelets: 178 10*3/uL (ref 150–400)
RBC: 3.57 MIL/uL — ABNORMAL LOW (ref 4.22–5.81)
RDW: 13.5 % (ref 11.5–15.5)
WBC: 9.2 10*3/uL (ref 4.0–10.5)

## 2013-08-20 LAB — COMPREHENSIVE METABOLIC PANEL
ALT: 29 U/L (ref 0–53)
AST: 31 U/L (ref 0–37)
Albumin: 2.7 g/dL — ABNORMAL LOW (ref 3.5–5.2)
Alkaline Phosphatase: 43 U/L (ref 39–117)
Anion gap: 18 — ABNORMAL HIGH (ref 5–15)
BUN: 20 mg/dL (ref 6–23)
CO2: 18 mEq/L — ABNORMAL LOW (ref 19–32)
Calcium: 8.1 mg/dL — ABNORMAL LOW (ref 8.4–10.5)
Chloride: 102 mEq/L (ref 96–112)
Creatinine, Ser: 0.56 mg/dL (ref 0.50–1.35)
GFR calc Af Amer: >90 mL/min (ref >90)
GFR calc non Af Amer: >90 mL/min (ref >90)
Glucose, Bld: 100 mg/dL — ABNORMAL HIGH (ref 70–99)
Potassium: 3.4 mEq/L — ABNORMAL LOW (ref 3.7–5.3)
Sodium: 138 mEq/L (ref 137–147)
Total Bilirubin: 2.3 mg/dL — ABNORMAL HIGH (ref 0.3–1.2)
Total Protein: 6.1 g/dL (ref 6.0–8.3)

## 2013-08-20 NOTE — Progress Notes (Signed)
   Subjective  No complaints   Objective  Pt seen for elevated Total bili and enzymes, which have come down to normal this morning. He denies abd. Pain. He is now 4 days post open cholecystectomy and repair of cholecysto-duodenal fistula. Abdomen is soft, normal bowl sounds, Vital signs in last 24 hours: Temp:  [98.3 F (36.8 C)-98.4 F (36.9 C)] 98.4 F (36.9 C) (08/16 0555) Pulse Rate:  [87-88] 88 (08/16 0555) Resp:  [16-18] 16 (08/16 0555) BP: (148-149)/(58-73) 148/65 mmHg (08/16 0555) SpO2:  [94 %-98 %] 98 % (08/16 0555)   General:    [place] male in NAD Heart:  Regular rate and rhythm; no murmurs Lungs: Respirations even and unlabored, lungs CTA bilaterally Abdomen:  Soft, nontender and nondistended. Normal bowel sounds.. NG tube removed, post chole scar Extremities:  Without edema. Neurologic:  Alert and oriented,  grossly normal neurologically. Psych:  Cooperative. Normal mood and affect.  Intake/Output from previous day: 08/15 0701 - 08/16 0700 In: 525 [I.V.:525] Out: 1085 [Urine:1050; Drains:35] Intake/Output this shift:    Lab Results:  Recent Labs  08/19/13 0105 08/20/13 0432  WBC 12.6* 9.2  HGB 10.8* 10.8*  HCT 30.3* 30.0*  PLT 162 178   BMET  Recent Labs  08/19/13 0105 08/20/13 0432  NA 133* 138  K 4.1 3.4*  CL 97 102  CO2 19 18*  GLUCOSE 118* 100*  BUN 17 20  CREATININE 0.63 0.56  CALCIUM 8.2* 8.1*   LFT  Recent Labs  08/20/13 0432  PROT 6.1  ALBUMIN 2.7*  AST 31  ALT 29  ALKPHOS 43  BILITOT 2.3*   PT/INR No results found for this basename: LABPROT, INR,  in the last 72 hours  Studies/Results: No results found.     Assessment / Plan:   Normalization of LFT's continues, will fractionate total bilirubin to r/o Gilbert's syndrome ( increased indirect bili), we will sign off- repeat LFT's on post op visit, and if LFT's remain elevated ,refer back to Dr Deatra Ina who is his GI MD  Active Problems:   Cholecystoduodenal  fistula     LOS: 4 days   Delfin Edis  08/20/2013, 8:44 AM

## 2013-08-20 NOTE — Progress Notes (Signed)
General Surgery Note  LOS: 4 days  POD -  4 Days Post-Op PCP - [facility] Deatra Ina  Assessment/Plan: 1.  LAPAROSCOPIC CONVERTED TO OPEN  CHOLECYSTECTOMY WITH INTRAOPERATIVE CHOLANGIOGRAM, repair of duodenotomy - 08/16/2013 - M. Tsuei  For Cholecystoduodenal fistula  Has passed flatus  NGT removed - keep NPO except ice chips  1A.  Seen by Dr. Olevia Perches for elevated T. Bili - appreciate her assistance  T. Bili about the same as yesterday - 2.3 - 08/20/2013  Recheck again in AM. 2.  Pre diabetes  Glucose - 100 - 08/20/2013 3.  HTN 4.  Hyperlipidemia 5.  BPH 6.  History of stroke 7.  Polyneuropathy 8.  Hgb - 10.8 - 08/20/2013  9.  DVT prophylaxis - Lovenox   Active Problems:   Cholecystoduodenal fistula  Subjective:  Feels better today.  Passing flatus.  He is hungry.   Objective:   Filed Vitals:   08/19/13 2207  BP: 148/58  Pulse: 87  Temp: 98.4 F (36.9 C)  Resp: 17     Intake/Output from previous day:  08/15 0701 - 08/16 0700 In: 525 [I.V.:525] Out: 1085 [Urine:1050; Drains:35]  Intake/Output this shift:      Physical Exam:   General: WN [place] M who is alert and oriented.    HEENT: Normal. Pupils equal.  Has NGT. Marland Kitchen   Lungs: Clear.  Still with cough.  Needs to continue to work IS.   Abdomen: Soft.  Has BS.   Wound:  Wounds look good.  Drain - 55 cc recorded last 24 hours.  Will leave in till eating.   Lab Results:     Recent Labs  08/19/13 0105 08/20/13 0432  WBC 12.6* 9.2  HGB 10.8* 10.8*  HCT 30.3* 30.0*  PLT 162 178    BMET    Recent Labs  08/19/13 0105 08/20/13 0432  NA 133* 138  K 4.1 3.4*  CL 97 102  CO2 19 18*  GLUCOSE 118* 100*  BUN 17 20  CREATININE 0.63 0.56  CALCIUM 8.2* 8.1*    PT/INR  No results found for this basename: LABPROT, INR,  in the last 72 hours  ABG  No results found for this basename: PHART, PCO2, PO2, HCO3,  in the last 72 hours   Studies/Results:  No results found.   Anti-infectives:   Anti-infectives   Start     Dose/Rate Route Frequency Ordered Stop   08/16/13 1600  ceFAZolin (ANCEF) IVPB 1 g/50 mL premix     1 g 100 mL/hr over 30 Minutes Intravenous Every 6 hours 08/16/13 1432 08/17/13 0505   08/16/13 0702  ceFAZolin (ANCEF) IVPB 2 g/50 mL premix     2 g 100 mL/hr over 30 Minutes Intravenous On call to O.R. 08/16/13 0702 08/16/13 5631      Alphonsa Overall, MD, FACS Pager: [facility] Office: [phone] 08/20/2013

## 2013-08-21 LAB — CBC WITH DIFFERENTIAL/PLATELET
Basophils Absolute: 0 10*3/uL (ref 0.0–0.1)
Basophils Relative: 0 % (ref 0–1)
Eosinophils Absolute: 0 10*3/uL (ref 0.0–0.7)
Eosinophils Relative: 0 % (ref 0–5)
HCT: 31.5 % — ABNORMAL LOW (ref 39.0–52.0)
Hemoglobin: 11.2 g/dL — ABNORMAL LOW (ref 13.0–17.0)
Lymphocytes Relative: 7 % — ABNORMAL LOW (ref 12–46)
Lymphs Abs: 0.8 10*3/uL (ref 0.7–4.0)
MCH: 30.4 pg (ref 26.0–34.0)
MCHC: 35.6 g/dL (ref 30.0–36.0)
MCV: 85.4 fL (ref 78.0–100.0)
Monocytes Absolute: 1.3 10*3/uL — ABNORMAL HIGH (ref 0.1–1.0)
Monocytes Relative: 11 % (ref 3–12)
Neutro Abs: 10.1 10*3/uL — ABNORMAL HIGH (ref 1.7–7.7)
Neutrophils Relative %: 82 % — ABNORMAL HIGH (ref 43–77)
Platelets: 183 10*3/uL (ref 150–400)
RBC: 3.69 MIL/uL — ABNORMAL LOW (ref 4.22–5.81)
RDW: 13.7 % (ref 11.5–15.5)
WBC: 12.2 10*3/uL — ABNORMAL HIGH (ref 4.0–10.5)

## 2013-08-21 LAB — HEPATIC FUNCTION PANEL
ALT: 23 U/L (ref 0–53)
AST: 22 U/L (ref 0–37)
Albumin: 2.9 g/dL — ABNORMAL LOW (ref 3.5–5.2)
Alkaline Phosphatase: 45 U/L (ref 39–117)
Bilirubin, Direct: 0.7 mg/dL — ABNORMAL HIGH (ref 0.0–0.3)
Indirect Bilirubin: 1.8 mg/dL — ABNORMAL HIGH (ref 0.3–0.9)
Total Bilirubin: 2.5 mg/dL — ABNORMAL HIGH (ref 0.3–1.2)
Total Protein: 6.2 g/dL (ref 6.0–8.3)

## 2013-08-21 MED ORDER — PANTOPRAZOLE SODIUM 40 MG PO TBEC
40.0000 mg | DELAYED_RELEASE_TABLET | Freq: Every day | ORAL | Status: DC
  Administered 2013-08-21 – 2013-08-24 (×4): 40 mg via ORAL
  Filled 2013-08-21 (×4): qty 1

## 2013-08-21 NOTE — Plan of Care (Signed)
Problem: Discharge Progression Outcomes Goal: Staples/sutures removed Outcome: Completed/Met Date Met:  08/21/13 Staples removed incision is now wet to dry dressing.

## 2013-08-21 NOTE — Progress Notes (Signed)
Physical Therapy Treatment Patient Details Name: [name] MRN: [id number] DOB: 02/21/1926 Today's Date: 08/21/2013    History of Present Illness pt presents post open cholecystectomy.      PT Comments    Progressing steadily.  Mildly unsteady with RW and slightly more so without device.  Pt does not feel confident scanning his environment.  Follow Up Recommendations  Home health PT;Supervision - Intermittent     Equipment Recommendations  Rolling walker with 5" wheels    Recommendations for Other Services       Precautions / Restrictions Precautions Precautions: Fall Restrictions Weight Bearing Restrictions: No    Mobility  Bed Mobility Overal bed mobility: Needs Assistance Bed Mobility: Sit to Supine       Sit to supine: Min guard   General bed mobility comments: reinforced log roll, but pt gingerly layed down straight to supine.  Transfers Overall transfer level: Needs assistance Equipment used: Rolling walker (2 wheeled) Transfers: Sit to/from Stand Sit to Stand: Min guard         General transfer comment: dues for hand placement sitting and standing  Ambulation/Gait Ambulation/Gait assistance: Supervision;Min guard (min guard without device) Ambulation Distance (Feet): 360 Feet Assistive device: Rolling walker (2 wheeled) Gait Pattern/deviations: Step-through pattern;Decreased step length - right;Decreased step length - left;Decreased stride length;Trunk flexed   Gait velocity interpretation: Below normal speed for age/gender General Gait Details: slow and guarded gait with pt not comfortable scanning his environment.  More unsteady without RW, but still at min guard level.   Stairs            Wheelchair Mobility    Modified Rankin (Stroke Patients Only)       Balance Overall balance assessment: Needs assistance Sitting-balance support: No upper extremity supported Sitting balance-Leahy Scale: Fair     Standing balance  support: No upper extremity supported Standing balance-Leahy Scale: Fair Standing balance comment: can not handle balance challenge without instability                    Cognition Arousal/Alertness: Awake/alert Behavior During Therapy: WFL for tasks assessed/performed Overall Cognitive Status: Within Functional Limits for tasks assessed                      Exercises      General Comments        Pertinent Vitals/Pain Pain Assessment:  (a little sore)    Home Living                      Prior Function            PT Goals (current goals can now be found in the care plan section) Acute Rehab PT Goals Patient Stated Goal: None stated.   PT Goal Formulation: With patient Time For Goal Achievement: 09/01/13 Potential to Achieve Goals: Good Progress towards PT goals: Progressing toward goals    Frequency  Min 3X/week    PT Plan Current plan remains appropriate    Co-evaluation             End of Session   Activity Tolerance: Patient tolerated treatment well Patient left: in bed;with call bell/phone within reach;with family/visitor present     Time: 4098-1191 PT Time Calculation (min): 24 min  Charges:  $Gait Training: 8-22 mins $Therapeutic Activity: 8-22 mins                    G Codes:  Mottinger, Tessie Fass 08/21/2013, 6:07 PM 08/21/2013  Donnella Sham, [place] [phone]  (pager)

## 2013-08-21 NOTE — Care Management (Signed)
08-21-13 Spoke with patient , his wife and daughter at bedside .  Confirmed face sheet information.  Daughter lives in [place] , [place].  Patient's wife has fibromyalgia . Wife can assist patient at home , but is requesting a home health aide .  Provided patient and family with list of home health agencies . They will discuss which agency they would want. Will follow up.  Provided copy of IM , patient and family voiced understanding .  Patient has a walker at home already , does not want a 3 in 1.  Will need orders for home health PT,RN and aide .  Thanks  Magdalen Spatz RN BSN [phone]

## 2013-08-21 NOTE — Progress Notes (Addendum)
0500 Patient has been very confused at times during the night oriented to person and place but not to situation. Patient has attempted to get out of be without assistance bed alarm was on. Patient has slept very little tonight. Patient got up  bedside commode and when he got up from the bedside commode stated I going to see my daughter and was trying to go out the door. Patient had a very unsteady gait. Nurse was able to convince patient to sit down on the bed. Patient had gotten very agitated because he thought his daughter was outside the door and we would not let him see her. Patient was explain that daughter was here last night but had gone home.Patient was convince that he heard her talking outside his room. Nurse told patient that she would be glad to call her and let him talk to her and that satisfied the patient. There was no number listed in the contacts area for his daughter so patient gave nurse several numbers but was unable to get in touch with daughter. Patient stated" That's ok we will try later." Phone place beside patient. 0620  Patient very calm now and his resting, bed alarm on and will continue to monitor for safety needs.

## 2013-08-21 NOTE — Progress Notes (Signed)
5 Days Post-Op  Subjective: Patient feeling better Had a bowel movement yesterday Minimal - non-bilious drainage from JP  Objective: Vital signs in last 24 hours: Temp:  [97.6 F (36.4 C)-98.3 F (36.8 C)] 97.6 F (36.4 C) (08/17 0512) Pulse Rate:  [87-110] 110 (08/17 0512) Resp:  [16-18] 18 (08/17 0512) BP: (139-158)/(61-74) 139/74 mmHg (08/17 0512) SpO2:  [97 %-98 %] 98 % (08/17 0512)    Intake/Output from previous day: 08/16 0701 - 08/17 0700 In: 1785 [I.V.:1785] Out: 665 [Urine:650; Drains:15] Intake/Output this shift:    General appearance: alert, cooperative and no distress GI: soft; swollen and erythematous around R subcostal incision Staples removed and incision opened - large amount of foul smelling purulent fluid expressed; fascia intact  Lab Results:   Recent Labs  08/20/13 0432 08/21/13 0636  WBC 9.2 12.2*  HGB 10.8* 11.2*  HCT 30.0* 31.5*  PLT 178 183   BMET  Recent Labs  08/19/13 0105 08/20/13 0432  NA 133* 138  K 4.1 3.4*  CL 97 102  CO2 19 18*  GLUCOSE 118* 100*  BUN 17 20  CREATININE 0.63 0.56  CALCIUM 8.2* 8.1*   Hepatic Function Latest Ref Rng 08/21/2013 08/20/2013 08/19/2013  Total Protein 6.0 - 8.3 g/dL 6.2 6.1 6.1  Albumin 3.5 - 5.2 g/dL 2.9(L) 2.7(L) 2.8(L)  AST 0 - 37 U/L 22 31 55(H)  ALT 0 - 53 U/L 23 29 40  Alk Phosphatase 39 - 117 U/L 45 43 65  Total Bilirubin 0.3 - 1.2 mg/dL 2.5(H) 2.3(H) 2.5(H)  Bilirubin, Direct 0.0 - 0.3 mg/dL 0.7(H) - -     PT/INR No results found for this basename: LABPROT, INR,  in the last 72 hours ABG No results found for this basename: PHART, PCO2, PO2, HCO3,  in the last 72 hours  Studies/Results: No results found.  Anti-infectives: Anti-infectives   Start     Dose/Rate Route Frequency Ordered Stop   08/16/13 1600  ceFAZolin (ANCEF) IVPB 1 g/50 mL premix     1 g 100 mL/hr over 30 Minutes Intravenous Every 6 hours 08/16/13 1432 08/17/13 0505   08/16/13 0702  ceFAZolin (ANCEF) IVPB 2 g/50  mL premix     2 g 100 mL/hr over 30 Minutes Intravenous On call to O.R. 08/16/13 5366 08/16/13 0832      Assessment/Plan: s/p Procedure(s): LAPAROSCOPIC CONVERTED TO OPEN  CHOLECYSTECTOMY WITH INTRAOPERATIVE CHOLANGIOGRAM (N/A) Advance diet Clear liquids only today BID dressing changes  T bili remains slightly elevated, but not significantly trending upwards.  Will follow for now.   LOS: 5 days    TSUEI,MATTHEW K. 08/21/2013

## 2013-08-22 LAB — COMPREHENSIVE METABOLIC PANEL
ALT: 20 U/L (ref 0–53)
AST: 20 U/L (ref 0–37)
Albumin: 2.5 g/dL — ABNORMAL LOW (ref 3.5–5.2)
Alkaline Phosphatase: 45 U/L (ref 39–117)
Anion gap: 16 — ABNORMAL HIGH (ref 5–15)
BUN: 14 mg/dL (ref 6–23)
CO2: 20 mEq/L (ref 19–32)
Calcium: 7.8 mg/dL — ABNORMAL LOW (ref 8.4–10.5)
Chloride: 106 mEq/L (ref 96–112)
Creatinine, Ser: 0.56 mg/dL (ref 0.50–1.35)
GFR calc Af Amer: >90 mL/min (ref >90)
GFR calc non Af Amer: >90 mL/min (ref >90)
Glucose, Bld: 85 mg/dL (ref 70–99)
Potassium: 3.1 mEq/L — ABNORMAL LOW (ref 3.7–5.3)
Sodium: 142 mEq/L (ref 137–147)
Total Bilirubin: 2.1 mg/dL — ABNORMAL HIGH (ref 0.3–1.2)
Total Protein: 5.4 g/dL — ABNORMAL LOW (ref 6.0–8.3)

## 2013-08-22 LAB — CBC
HCT: 27.9 % — ABNORMAL LOW (ref 39.0–52.0)
Hemoglobin: 10.3 g/dL — ABNORMAL LOW (ref 13.0–17.0)
MCH: 30.7 pg (ref 26.0–34.0)
MCHC: 36.9 g/dL — ABNORMAL HIGH (ref 30.0–36.0)
MCV: 83 fL (ref 78.0–100.0)
Platelets: 183 10*3/uL (ref 150–400)
RBC: 3.36 MIL/uL — ABNORMAL LOW (ref 4.22–5.81)
RDW: 13.6 % (ref 11.5–15.5)
WBC: 8.2 10*3/uL (ref 4.0–10.5)

## 2013-08-22 NOTE — Progress Notes (Signed)
6 Days Post-Op  Subjective: Patient more awake, alert Does not seem confused States that he feels better - did well with clear liquids yesterday + BM Minimal drain output - non-bilious  Objective: Vital signs in last 24 hours: Temp:  [98.1 F (36.7 C)-98.7 F (37.1 C)] 98.1 F (36.7 C) (08/18 0505) Pulse Rate:  [75-84] 76 (08/18 0505) Resp:  [18-20] 18 (08/18 0505) BP: (134-147)/(63-70) 134/63 mmHg (08/18 0505) SpO2:  [95 %-99 %] 99 % (08/18 0505) Last BM Date: 08/21/13  Intake/Output from previous day: 08/17 0701 - 08/18 0700 In: 1955.5 [P.O.:1038; I.V.:917.5] Out: 1655 [Urine:1650; Drains:5] Intake/Output this shift:    General appearance: alert, cooperative and no distress Resp: clear to auscultation bilaterally Cardio: regular rate and rhythm, S1, S2 normal, no murmur, click, rub or gallop GI: soft, incisional tenderness; less erythema around wound Necrotic appearing fascia - still intact; less foul-smelling than yesterday  Lab Results:   Recent Labs  08/21/13 0636 08/22/13 0422  WBC 12.2* 8.2  HGB 11.2* 10.3*  HCT 31.5* 27.9*  PLT 183 183   BMET  Recent Labs  08/20/13 0432 08/22/13 0422  NA 138 142  K 3.4* 3.1*  CL 102 106  CO2 18* 20  GLUCOSE 100* 85  BUN 20 14  CREATININE 0.56 0.56  CALCIUM 8.1* 7.8*   PT/INR No results found for this basename: LABPROT, INR,  in the last 72 hours ABG No results found for this basename: PHART, PCO2, PO2, HCO3,  in the last 72 hours  Studies/Results: No results found.  Anti-infectives: Anti-infectives   Start     Dose/Rate Route Frequency Ordered Stop   08/16/13 1600  ceFAZolin (ANCEF) IVPB 1 g/50 mL premix     1 g 100 mL/hr over 30 Minutes Intravenous Every 6 hours 08/16/13 1432 08/17/13 0505   08/16/13 0702  ceFAZolin (ANCEF) IVPB 2 g/50 mL premix     2 g 100 mL/hr over 30 Minutes Intravenous On call to O.R. 08/16/13 5625 08/16/13 0832      Assessment/Plan: s/p Procedure(s): LAPAROSCOPIC  CONVERTED TO OPEN  CHOLECYSTECTOMY WITH INTRAOPERATIVE CHOLANGIOGRAM (N/A) Cholecystoduodenal fistula Wound infection Bilirubin improving Tolerating diet - regained bowel function WBC normal  Advance diet TID dressing changes Physical therapy   LOS: 6 days    TSUEI,MATTHEW K. 08/22/2013

## 2013-08-22 NOTE — Telephone Encounter (Signed)
Spoke with pt to follow up regarding his letter he was requesting. Hard to understand pt - advised pt to bring in requested information hes requesting so we can fill out.

## 2013-08-22 NOTE — Progress Notes (Signed)
PT Cancellation Note  Patient Details Name: [name] MRN: [id number] DOB: 05/14/1926   Cancelled Treatment:    Reason Eval/Treat Not Completed: Patient declined, for no particular cited reason. 08/22/2013  Donnella Sham, [place] [phone]  (pager)   [place], Tessie Fass 08/22/2013, 5:14 PM

## 2013-08-22 NOTE — Discharge Planning (Signed)
Pt's family requests [facility] to provide services at home. The family requests that PT be resumed with Shanon Brow, a PT who works for [employer].

## 2013-08-23 LAB — CBC
HCT: 28.5 % — ABNORMAL LOW (ref 39.0–52.0)
Hemoglobin: 10.5 g/dL — ABNORMAL LOW (ref 13.0–17.0)
MCH: 30.3 pg (ref 26.0–34.0)
MCHC: 36.8 g/dL — ABNORMAL HIGH (ref 30.0–36.0)
MCV: 82.4 fL (ref 78.0–100.0)
Platelets: 219 10*3/uL (ref 150–400)
RBC: 3.46 MIL/uL — ABNORMAL LOW (ref 4.22–5.81)
RDW: 13.7 % (ref 11.5–15.5)
WBC: 8.4 10*3/uL (ref 4.0–10.5)

## 2013-08-23 LAB — COMPREHENSIVE METABOLIC PANEL
ALT: 18 U/L (ref 0–53)
AST: 17 U/L (ref 0–37)
Albumin: 2.5 g/dL — ABNORMAL LOW (ref 3.5–5.2)
Alkaline Phosphatase: 46 U/L (ref 39–117)
Anion gap: 12 (ref 5–15)
BUN: 10 mg/dL (ref 6–23)
CO2: 25 mEq/L (ref 19–32)
Calcium: 8 mg/dL — ABNORMAL LOW (ref 8.4–10.5)
Chloride: 104 mEq/L (ref 96–112)
Creatinine, Ser: 0.53 mg/dL (ref 0.50–1.35)
GFR calc Af Amer: >90 mL/min (ref >90)
GFR calc non Af Amer: >90 mL/min (ref >90)
Glucose, Bld: 125 mg/dL — ABNORMAL HIGH (ref 70–99)
Potassium: 3.3 mEq/L — ABNORMAL LOW (ref 3.7–5.3)
Sodium: 141 mEq/L (ref 137–147)
Total Bilirubin: 1.4 mg/dL — ABNORMAL HIGH (ref 0.3–1.2)
Total Protein: 5.5 g/dL — ABNORMAL LOW (ref 6.0–8.3)

## 2013-08-23 LAB — CREATININE, SERUM
Creatinine, Ser: 0.57 mg/dL (ref 0.50–1.35)
GFR calc Af Amer: >90 mL/min (ref >90)
GFR calc non Af Amer: >90 mL/min (ref >90)

## 2013-08-23 MED ORDER — AMOXICILLIN-POT CLAVULANATE 875-125 MG PO TABS
1.0000 | ORAL_TABLET | Freq: Two times a day (BID) | ORAL | Status: DC
  Administered 2013-08-23 – 2013-08-24 (×3): 1 via ORAL
  Filled 2013-08-23 (×4): qty 1

## 2013-08-23 NOTE — Care Management Note (Signed)
  Page 2 of 2   08/23/2013     11:04:39 AM CARE MANAGEMENT NOTE 08/23/2013  Patient:  [name], [name]   Account Number:  [id number]  Date Initiated:  08/21/2013  Documentation initiated by:  Magdalen Spatz  Subjective/Objective Assessment:     Action/Plan:   Anticipated DC Date:  08/24/2013   Anticipated DC Plan:  [place]         Choice offered to / List presented to:  C-1 Patient        [place] arranged  HH-1 RN  [place]      Status of service:  Completed, signed off Medicare Important Message given?  YES (If response is "NO", the following Medicare IM given date fields will be blank) Date Medicare IM given:  08/21/2013 Medicare IM given by:  Magdalen Spatz Date Additional Medicare IM given:   Additional Medicare IM given by:    Discharge Disposition:    Per UR Regulation:    If discussed at Long Length of Stay Meetings, dates discussed:   08/22/2013    Comments:  08-23-13 Lelan Pons with [facility] spoke to patient and family , they will have nurse visit 08-23-13 , 08-24-13, 08-25-13 and possible 08-26-13. Magdalen Spatz RN BSN [phone]   08-23-13 Family / patient have decided on [facility] . All were thinking a home health nurse would come to their home everytime dressing was due to be changed . Reminded them of our conversation two days ago that the nurse would teach wife how to change dressing .  Patient, daughter and wife all every concerned and want a nurse to change dressing each time .  Private pay RN is roughly $125 per visit .  Suggested we could look into SNF for wound care . Patient does not want to do this .  Offered to have Lelan Pons from [facility] come explain number / frequency of visits .  All agreed .  Will follow up after they meet with Lelan Pons from [facility] .  Magdalen Spatz RN BSN [phone]    08-21-13 Spoke with patient , his wife and daughter at bedside .  Confirmed face sheet information.  Daughter lives  in [place] , [place].  Patient's wife has fibromyalgia . Wife can assist patient at home , but is requesting a home health aide .  Provided patient and family with list of home health agencies . They will discuss which agency they would want. Will follow up.  Provided copy of IM , patient and family voiced understanding .  Patient has a walker at home already , does not want a 3 in 1.  Will need orders for home health PT,RN and aide .  Thanks  Magdalen Spatz RN BSN [phone]

## 2013-08-23 NOTE — Progress Notes (Signed)
Physical Therapy Treatment Patient Details Name: [name] MRN: [id number] DOB: Jun 12, 1926 Today's Date: 08/23/2013    History of Present Illness pt presents post open cholecystectomy.      PT Comments    Pt able to greatly progress mobility today with therapy. Pt ambulated unit x2. Pt refused to mobilize without RW; pt feels more comfortable and balanced with RW. Pt encouraged to mobilize as tolerated with nursing, pt verbalized understanding. No further acute PT needs warranted at this time.   Follow Up Recommendations  Home health PT;Supervision - Intermittent     Equipment Recommendations  Rolling walker with 5" wheels    Recommendations for Other Services       Precautions / Restrictions Precautions Precautions: None Restrictions Weight Bearing Restrictions: No    Mobility  Bed Mobility Overal bed mobility: Modified Independent Bed Mobility: Rolling;Sidelying to Sit Rolling: Modified independent (Device/Increase time) Sidelying to sit: Modified independent (Device/Increase time)       General bed mobility comments: reinforced log rolling technique; no physical (A) needed  Transfers Overall transfer level: Modified independent Equipment used: Rolling walker (2 wheeled) Transfers: Sit to/from Stand Sit to Stand: Modified independent (Device/Increase time)         General transfer comment: no sway or LOB noted; demo good safety awareness  Ambulation/Gait Ambulation/Gait assistance: Modified independent (Device/Increase time) Ambulation Distance (Feet): 800 Feet Assistive device: Rolling walker (2 wheeled) Gait Pattern/deviations: Step-through pattern;Decreased stride length Gait velocity: decreased vs baseline  Gait velocity interpretation: Below normal speed for age/gender General Gait Details: guarded gt; no LOB noted; pt able to scan hallway without LOB; no balance deficits noted; pt encouraged to continue mobility with nurses; pt refusing to  attempt ambulation without RW    Stairs            Wheelchair Mobility    Modified Rankin (Stroke Patients Only)       Balance Overall balance assessment: Modified Independent;No apparent balance deficits (not formally assessed)                           High level balance activites: Direction changes;Turns;Sudden stops;Head turns High Level Balance Comments: no LOB noted    Cognition Arousal/Alertness: Awake/alert Behavior During Therapy: WFL for tasks assessed/performed Overall Cognitive Status: Within Functional Limits for tasks assessed                      Exercises      General Comments        Pertinent Vitals/Pain Pain Assessment: No/denies pain    Home Living                      Prior Function            PT Goals (current goals can now be found in the care plan section) Acute Rehab PT Goals Patient Stated Goal: sure we can walk PT Goal Formulation: With patient Time For Goal Achievement: 09/01/13 Potential to Achieve Goals: Good Progress towards PT goals: Goals met/education completed, patient discharged from PT    Frequency  Min 3X/week    PT Plan Current plan remains appropriate    Co-evaluation             End of Session   Activity Tolerance: Patient tolerated treatment well Patient left: in chair;with call bell/phone within reach     Time: 4462-8638 PT Time Calculation (min): 28 min  Charges:  $Gait  Training: 23-37 mins                    G Codes:      Elie Confer Florien, [place] 08/23/2013, 5:19 PM

## 2013-08-23 NOTE — Progress Notes (Signed)
7 Days Post-Op  Subjective: Patient is doing much better Minimal pain Tolerating vegetarian diet Minimal non-bilious drainage from JP  Objective: Vital signs in last 24 hours: Temp:  [97.9 F (36.6 C)-99.1 F (37.3 C)] 98 F (36.7 C) (08/19 0523) Pulse Rate:  [82-95] 88 (08/19 0523) Resp:  [18-19] 18 (08/19 0523) BP: (138-145)/(57-65) 145/65 mmHg (08/19 0523) SpO2:  [95 %-99 %] 95 % (08/19 0523) Last BM Date: 08/21/13  Intake/Output from previous day: 08/18 0701 - 08/19 0700 In: 3007.5 [P.O.:1320; I.V.:1687.5] Out: 2105 [Urine:2100; Drains:5] Intake/Output this shift:    General appearance: alert, cooperative and no distress Resp: clear to auscultation bilaterally Cardio: regular rate and rhythm, S1, S2 normal, no murmur, click, rub or gallop GI: soft, minimal tenderness around incision Wound - still with some necrotic appearing anterior fascia, but deep fascia seems to be intact Less foul-smelling than yesterday  Lab Results:   Recent Labs  08/21/13 0636 08/22/13 0422  WBC 12.2* 8.2  HGB 11.2* 10.3*  HCT 31.5* 27.9*  PLT 183 183   BMET  Recent Labs  08/22/13 0422 08/23/13 0455  NA 142  --   K 3.1*  --   CL 106  --   CO2 20  --   GLUCOSE 85  --   BUN 14  --   CREATININE 0.56 0.57  CALCIUM 7.8*  --    PT/INR No results found for this basename: LABPROT, INR,  in the last 72 hours ABG No results found for this basename: PHART, PCO2, PO2, HCO3,  in the last 72 hours  Studies/Results: No results found.  Anti-infectives: Anti-infectives   Start     Dose/Rate Route Frequency Ordered Stop   08/16/13 1600  ceFAZolin (ANCEF) IVPB 1 g/50 mL premix     1 g 100 mL/hr over 30 Minutes Intravenous Every 6 hours 08/16/13 1432 08/17/13 0505   08/16/13 0702  ceFAZolin (ANCEF) IVPB 2 g/50 mL premix     2 g 100 mL/hr over 30 Minutes Intravenous On call to O.R. 08/16/13 3546 08/16/13 0832      Assessment/Plan: s/p Procedure(s): LAPAROSCOPIC CONVERTED TO OPEN   CHOLECYSTECTOMY WITH INTRAOPERATIVE CHOLANGIOGRAM (N/A) Plan for discharge tomorrow [place] for bid wet to dry dressing changes Will remove drain and remainder of staples tomorrow Probable discharge tomorrow - it will take a while for the wound to clean up completely.    LOS: 7 days    TSUEI,MATTHEW K. 08/23/2013

## 2013-08-23 NOTE — Plan of Care (Signed)
Problem: Acute Rehab PT Goals(only PT should resolve) Goal: Pt Will Go Up/Down Stairs Outcome: Not Applicable Date Met:  24/46/95 Pt stated he did not have stairs to negotiate at home.

## 2013-08-24 DIAGNOSIS — Z48815 Encounter for surgical aftercare following surgery on the digestive system: Secondary | ICD-10-CM | POA: Diagnosis not present

## 2013-08-24 DIAGNOSIS — K823 Fistula of gallbladder: Secondary | ICD-10-CM | POA: Diagnosis not present

## 2013-08-24 DIAGNOSIS — K8010 Calculus of gallbladder with chronic cholecystitis without obstruction: Secondary | ICD-10-CM | POA: Diagnosis not present

## 2013-08-24 DIAGNOSIS — Z4801 Encounter for change or removal of surgical wound dressing: Secondary | ICD-10-CM | POA: Diagnosis not present

## 2013-08-24 MED ORDER — AMOXICILLIN-POT CLAVULANATE 875-125 MG PO TABS: 1.0000 | ORAL_TABLET | Freq: Two times a day (BID) | ORAL | Status: DC

## 2013-08-24 MED ORDER — HYDROCODONE-ACETAMINOPHEN 5-325 MG PO TABS: 1.0000 | ORAL_TABLET | ORAL | Status: DC | PRN

## 2013-08-24 NOTE — Discharge Summary (Signed)
Physician Discharge Summary  Patient ID: [name] MRN: [id number] DOB/AGE: 78-Oct-1928 78 y.o.  Admit date: 08/16/2013 Discharge date: 08/24/2013  Admission Diagnoses:  Chronic calculus cholecystitis  Discharge Diagnoses:   Cholecystoduodenal fistula Surgical wound infection Active Problems:   Cholecystoduodenal fistula   Nonspecific elevation of levels of transaminase or lactic acid dehydrogenase (LDH)   Discharged Condition: good  Hospital Course: He underwent laparoscopic cholecystectomy converted to open cholecystectomy and repair of cholecystoduodenal fistula.  A drain was placed in the gallbladder fossa.  The patient was managed on the surgical ward with a nasogastric tube until he regained bowel function.  His bilirubin increased to 2.5 and GI was consulted.  Fortunately, the bilirubin has come down to 1.4 with no intervention.  He developed a wound infection and his right subcostal incision was opened on 08/21/13.  A large amount of foul-smelling purulent fluid was evacuated.  There was some necrosis of the superficial fascia.  Dressing changes were initiated.  The wound is starting to clean up some, but there remains some necrotic fascia, but the underlying muscle is intact.  He has been ambulating well and his diet was advanced to vegetarian diet.    Consults: GI  Significant Diagnostic Studies: none  Treatments: surgery: open cholecystectomy with repair of duodenum  Discharge Exam: Blood pressure 145/61, pulse 88, temperature 98 F (36.7 C), temperature source Oral, resp. rate 17, height 5\' 5"  (1.651 m), weight 130 lb (58.968 kg), SpO2 98.00%. General appearance: alert, cooperative and no distress GI: soft, minimal tenderness Wound - some necrotic fascia, but viable muscle visible underneath  Disposition: 01-Home or Self Care  Discharge Instructions   Call MD for:  persistant nausea and vomiting    Complete by:  As directed      Call MD for:  redness, tenderness,  or signs of infection (pain, swelling, redness, odor or green/yellow discharge around incision site)    Complete by:  As directed      Call MD for:  severe uncontrolled pain    Complete by:  As directed      Call MD for:  temperature >100.4    Complete by:  As directed      Diet general    Complete by:  As directed      Discharge wound care:    Complete by:  As directed   BID wet to dry dressing to right subcostal incision - cover with dry gauze and tape Abdominal binder over dressing.     Driving Restrictions    Complete by:  As directed   Do not drive while taking pain medications     Increase activity slowly    Complete by:  As directed      May shower / Bathe    Complete by:  As directed      May walk up steps    Complete by:  As directed             Medication List         amLODipine 5 MG tablet  Commonly known as:  NORVASC  Take 5 mg by mouth daily.     amoxicillin-clavulanate 875-125 MG per tablet  Commonly known as:  AUGMENTIN  Take 1 tablet by mouth every 12 (twelve) hours.     aspirin EC 81 MG tablet  Take 81 mg by mouth daily.     atenolol 50 MG tablet  Commonly known as:  TENORMIN  Take 25 mg by mouth daily.  finasteride 5 MG tablet  Commonly known as:  PROSCAR  Take 1 tablet (5 mg total) by mouth daily.     HYDROcodone-acetaminophen 5-325 MG per tablet  Commonly known as:  NORCO/VICODIN  Take 1 tablet by mouth every 4 (four) hours as needed.     omeprazole 20 MG capsule  Commonly known as:  PRILOSEC  Take 20 mg by mouth 2 (two) times daily.     simvastatin 20 MG tablet  Commonly known as:  ZOCOR  Take 0.5 tablets (10 mg total) by mouth at bedtime.     valsartan 160 MG tablet  Commonly known as:  DIOVAN  Take 1 tablet (160 mg total) by mouth daily.           Follow-up Information   Follow up with Maia Petties., MD In 2 weeks.   Specialty:  General Surgery   Contact information:   [address] [place]  [address] [phone]       Signed: Maia Petties. 08/24/2013, 3:32 PM

## 2013-08-24 NOTE — Progress Notes (Signed)
8 Days Post-Op  Subjective: Patient feels much better - ambulating vigorously Tolerating dressing changes well Minimal pain  Objective: Vital signs in last 24 hours: Temp:  [98 F (36.7 C)-98.4 F (36.9 C)] 98 F (36.7 C) (08/20 0617) Pulse Rate:  [88-91] 88 (08/20 0617) Resp:  [17] 17 (08/20 0617) BP: (136-145)/(61-65) 145/61 mmHg (08/20 0617) SpO2:  [98 %-100 %] 98 % (08/20 0617) Last BM Date: 08/21/13  Intake/Output from previous day:   Intake/Output this shift: Total I/O In: 240 [P.O.:240] Out: -   General appearance: alert, cooperative and no distress Resp: clear to auscultation bilaterally Cardio: regular rate and rhythm, S1, S2 normal, no murmur, click, rub or gallop GI: soft, minimal tenderness Wound - less smell; some of the necrotic fascia is beginning to peel away with healthy muscle underneath  Lab Results:   Recent Labs  08/22/13 0422 08/23/13 0755  WBC 8.2 8.4  HGB 10.3* 10.5*  HCT 27.9* 28.5*  PLT 183 219   BMET  Recent Labs  08/22/13 0422 08/23/13 0455 08/23/13 0755  NA 142  --  141  K 3.1*  --  3.3*  CL 106  --  104  CO2 20  --  25  GLUCOSE 85  --  125*  BUN 14  --  10  CREATININE 0.56 0.57 0.53  CALCIUM 7.8*  --  8.0*   PT/INR No results found for this basename: LABPROT, INR,  in the last 72 hours ABG No results found for this basename: PHART, PCO2, PO2, HCO3,  in the last 72 hours  Studies/Results: No results found.  Anti-infectives: Anti-infectives   Start     Dose/Rate Route Frequency Ordered Stop   08/23/13 1200  amoxicillin-clavulanate (AUGMENTIN) 875-125 MG per tablet 1 tablet     1 tablet Oral Every 12 hours 08/23/13 1125     08/16/13 1600  ceFAZolin (ANCEF) IVPB 1 g/50 mL premix     1 g 100 mL/hr over 30 Minutes Intravenous Every 6 hours 08/16/13 1432 08/17/13 0505   08/16/13 0702  ceFAZolin (ANCEF) IVPB 2 g/50 mL premix     2 g 100 mL/hr over 30 Minutes Intravenous On call to O.R. 08/16/13 7902 08/16/13 0832       Assessment/Plan: s/p Procedure(s): LAPAROSCOPIC CONVERTED TO OPEN  CHOLECYSTECTOMY WITH INTRAOPERATIVE CHOLANGIOGRAM (N/A) Continue with dressing changes - wet to dry dressings will continue to debride the necrotic fascia Fascia intact Tolerating diet + BM Probable discharge later today - will discuss with family.  There may be some difficulty with help at home.  HHN, PT, and aide arranged.    LOS: 8 days    TSUEI,MATTHEW K. 08/24/2013

## 2013-08-24 NOTE — Discharge Instructions (Signed)
[facility], [place] [phone]  OPEN ABDOMINAL SURGERY: POST OP INSTRUCTIONS  Always review your discharge instruction sheet given to you by the facility where your surgery was performed.  IF YOU HAVE DISABILITY OR FAMILY LEAVE FORMS, YOU MUST BRING THEM TO THE OFFICE FOR PROCESSING.  PLEASE DO NOT GIVE THEM TO YOUR DOCTOR.  1. A prescription for pain medication may be given to you upon discharge.  Take your pain medication as prescribed, if needed.  If narcotic pain medicine is not needed, then you may take acetaminophen (Tylenol) or ibuprofen (Advil) as needed. 2. Take your usually prescribed medications unless otherwise directed. 3. If you need a refill on your pain medication, please contact your pharmacy. They will contact our office to request authorization.  Prescriptions will not be filled after 5pm or on week-ends. 4. You should follow a light diet the first few days after arrival home, such as soup and crackers, pudding, etc.unless your doctor has advised otherwise. A high-fiber, low fat diet can be resumed as tolerated.   Be sure to include lots of fluids daily. Most patients will experience some swelling and bruising on the chest and neck area.  Ice packs will help.  Swelling and bruising can take several days to resolve 5. Most patients will experience some swelling and bruising in the area of the incision. Ice pack will help. Swelling and bruising can take several days to resolve..  6. It is common to experience some constipation if taking pain medication after surgery.  Increasing fluid intake and taking a stool softener will usually help or prevent this problem from occurring.  A mild laxative (Milk of Magnesia or Miralax) should be taken according to package directions if there are no bowel movements after 48 hours. 7.  Dressing changes as instructed - Home Health nursing/ physical therapy/ aide has been arranged. 8. ACTIVITIES:  You may resume regular (light)  daily activities beginning the next day--such as daily self-care, walking, climbing stairs--gradually increasing activities as tolerated.  You may have sexual intercourse when it is comfortable.  Refrain from any heavy lifting or straining until approved by your doctor. a. You may drive when you no longer are taking prescription pain medication, you can comfortably wear a seatbelt, and you can safely maneuver your car and apply brakes b. Return to Work: ___________________________________ 53. You should see your doctor in the office for a follow-up appointment approximately two weeks after your surgery.  Make sure that you call for this appointment within a day or two after you arrive home to insure a convenient appointment time. OTHER INSTRUCTIONS:  _____________________________________________________________ _____________________________________________________________  WHEN TO CALL YOUR DOCTOR: 1. Fever over 101.0 2. Inability to urinate 3. Nausea and/or vomiting 4. Extreme swelling or bruising 5. Continued bleeding from incision. 6. Increased pain, redness, or drainage from the incision. 7. Difficulty swallowing or breathing 8. Muscle cramping or spasms. 9. Numbness or tingling in hands or feet or around lips.  The clinic staff is available to answer your questions during regular business hours.  Please dont hesitate to call and ask to speak to one of the nurses if you have concerns.  For further questions, please visit [email or web address]

## 2013-08-24 NOTE — Progress Notes (Signed)
Pt. Discharge to home. Discharge instructions given to patient, spouse and daughter.  No question verbalized.

## 2013-08-24 NOTE — Progress Notes (Signed)
Orthopedic Tech Progress Note Patient Details:  [name] [birth year] [id number] Delivered to nursing secretary Ortho Devices Type of Ortho Device: Abdominal binder Ortho Device/Splint Interventions: Ordered   Asia Mellody Memos 08/24/2013, 12:04 PM

## 2013-08-25 DIAGNOSIS — Z4801 Encounter for change or removal of surgical wound dressing: Secondary | ICD-10-CM | POA: Diagnosis not present

## 2013-08-25 DIAGNOSIS — Z48815 Encounter for surgical aftercare following surgery on the digestive system: Secondary | ICD-10-CM | POA: Diagnosis not present

## 2013-08-25 DIAGNOSIS — K823 Fistula of gallbladder: Secondary | ICD-10-CM | POA: Diagnosis not present

## 2013-08-25 DIAGNOSIS — K8010 Calculus of gallbladder with chronic cholecystitis without obstruction: Secondary | ICD-10-CM | POA: Diagnosis not present

## 2013-08-26 DIAGNOSIS — Z4801 Encounter for change or removal of surgical wound dressing: Secondary | ICD-10-CM | POA: Diagnosis not present

## 2013-08-26 DIAGNOSIS — K823 Fistula of gallbladder: Secondary | ICD-10-CM | POA: Diagnosis not present

## 2013-08-26 DIAGNOSIS — K8010 Calculus of gallbladder with chronic cholecystitis without obstruction: Secondary | ICD-10-CM | POA: Diagnosis not present

## 2013-08-26 DIAGNOSIS — Z48815 Encounter for surgical aftercare following surgery on the digestive system: Secondary | ICD-10-CM | POA: Diagnosis not present

## 2013-08-27 DIAGNOSIS — Z4801 Encounter for change or removal of surgical wound dressing: Secondary | ICD-10-CM | POA: Diagnosis not present

## 2013-08-27 DIAGNOSIS — K823 Fistula of gallbladder: Secondary | ICD-10-CM | POA: Diagnosis not present

## 2013-08-27 DIAGNOSIS — Z48815 Encounter for surgical aftercare following surgery on the digestive system: Secondary | ICD-10-CM | POA: Diagnosis not present

## 2013-08-27 DIAGNOSIS — K8010 Calculus of gallbladder with chronic cholecystitis without obstruction: Secondary | ICD-10-CM | POA: Diagnosis not present

## 2013-08-28 DIAGNOSIS — Z4801 Encounter for change or removal of surgical wound dressing: Secondary | ICD-10-CM | POA: Diagnosis not present

## 2013-08-28 DIAGNOSIS — K8010 Calculus of gallbladder with chronic cholecystitis without obstruction: Secondary | ICD-10-CM | POA: Diagnosis not present

## 2013-08-28 DIAGNOSIS — Z48815 Encounter for surgical aftercare following surgery on the digestive system: Secondary | ICD-10-CM | POA: Diagnosis not present

## 2013-08-28 DIAGNOSIS — K823 Fistula of gallbladder: Secondary | ICD-10-CM | POA: Diagnosis not present

## 2013-08-30 DIAGNOSIS — K823 Fistula of gallbladder: Secondary | ICD-10-CM | POA: Diagnosis not present

## 2013-08-30 DIAGNOSIS — K8010 Calculus of gallbladder with chronic cholecystitis without obstruction: Secondary | ICD-10-CM | POA: Diagnosis not present

## 2013-08-30 DIAGNOSIS — Z48815 Encounter for surgical aftercare following surgery on the digestive system: Secondary | ICD-10-CM | POA: Diagnosis not present

## 2013-08-30 DIAGNOSIS — Z4801 Encounter for change or removal of surgical wound dressing: Secondary | ICD-10-CM | POA: Diagnosis not present

## 2013-08-31 DIAGNOSIS — K8010 Calculus of gallbladder with chronic cholecystitis without obstruction: Secondary | ICD-10-CM | POA: Diagnosis not present

## 2013-08-31 DIAGNOSIS — Z4801 Encounter for change or removal of surgical wound dressing: Secondary | ICD-10-CM | POA: Diagnosis not present

## 2013-08-31 DIAGNOSIS — Z48815 Encounter for surgical aftercare following surgery on the digestive system: Secondary | ICD-10-CM | POA: Diagnosis not present

## 2013-08-31 DIAGNOSIS — K823 Fistula of gallbladder: Secondary | ICD-10-CM | POA: Diagnosis not present

## 2013-09-01 ENCOUNTER — Encounter (INDEPENDENT_AMBULATORY_CARE_PROVIDER_SITE_OTHER): Payer: Self-pay | Admitting: Surgery

## 2013-09-01 ENCOUNTER — Ambulatory Visit (INDEPENDENT_AMBULATORY_CARE_PROVIDER_SITE_OTHER): Payer: Medicare Other | Admitting: Surgery

## 2013-09-01 VITALS — BP 116/70 | HR 74 | Temp 97.2°F | Ht 65.0 in | Wt 123.0 lb

## 2013-09-01 DIAGNOSIS — K823 Fistula of gallbladder: Secondary | ICD-10-CM

## 2013-09-01 NOTE — Progress Notes (Signed)
status post open cholecystectomy with repair of cholecystoduodenal fistula on 08/16/13.  The patient had a prolonged hospitalization with an ileus and a wound infection. The patient has some necrotic fascia. This is being dressed with wet-to-dry dressings. Overall the patient feels well. He has a very good appetite. He is gaining back some of the weight that he lost while in the hospital. He is not using any pain medication. He has bowel movements at least once a day.  Filed Vitals:   09/01/13 0840  BP: 116/70  Pulse: 74  Temp: 97.2 F (36.2 C)     His wound is beginning to granulate nicely. The sutures are loose but there is no sign of hernia. There is still a thin strip of necrotic fascia but this is being debrided with the dressing changes. I redressed the wound with 3 4 x 4 gauze pads. He may begin increasing his level of activity. Followup in 2 weeks for wound check.  Imogene Burn. Georgette Dover, MD, [facility]  General/ Trauma Surgery  09/01/2013 9:29 AM

## 2013-09-04 ENCOUNTER — Telehealth (INDEPENDENT_AMBULATORY_CARE_PROVIDER_SITE_OTHER): Payer: Self-pay

## 2013-09-04 DIAGNOSIS — Z48815 Encounter for surgical aftercare following surgery on the digestive system: Secondary | ICD-10-CM | POA: Diagnosis not present

## 2013-09-04 DIAGNOSIS — K8010 Calculus of gallbladder with chronic cholecystitis without obstruction: Secondary | ICD-10-CM | POA: Diagnosis not present

## 2013-09-04 DIAGNOSIS — Z4801 Encounter for change or removal of surgical wound dressing: Secondary | ICD-10-CM | POA: Diagnosis not present

## 2013-09-04 DIAGNOSIS — K823 Fistula of gallbladder: Secondary | ICD-10-CM | POA: Diagnosis not present

## 2013-09-04 NOTE — Telephone Encounter (Signed)
[facility] nurse is calling today to request 4 more home health visits. Verbal given to [facility] for 4 more home care visits.  Informed Nira Conn that I would send this to Dr Georgette Dover to make him aware. Heather verbalized understanding.

## 2013-09-05 DIAGNOSIS — Z4801 Encounter for change or removal of surgical wound dressing: Secondary | ICD-10-CM | POA: Diagnosis not present

## 2013-09-05 DIAGNOSIS — Z48815 Encounter for surgical aftercare following surgery on the digestive system: Secondary | ICD-10-CM | POA: Diagnosis not present

## 2013-09-05 DIAGNOSIS — K823 Fistula of gallbladder: Secondary | ICD-10-CM | POA: Diagnosis not present

## 2013-09-05 DIAGNOSIS — K8010 Calculus of gallbladder with chronic cholecystitis without obstruction: Secondary | ICD-10-CM | POA: Diagnosis not present

## 2013-09-07 DIAGNOSIS — Z48815 Encounter for surgical aftercare following surgery on the digestive system: Secondary | ICD-10-CM | POA: Diagnosis not present

## 2013-09-07 DIAGNOSIS — Z4801 Encounter for change or removal of surgical wound dressing: Secondary | ICD-10-CM | POA: Diagnosis not present

## 2013-09-07 DIAGNOSIS — K8010 Calculus of gallbladder with chronic cholecystitis without obstruction: Secondary | ICD-10-CM | POA: Diagnosis not present

## 2013-09-07 DIAGNOSIS — K823 Fistula of gallbladder: Secondary | ICD-10-CM | POA: Diagnosis not present

## 2013-09-12 DIAGNOSIS — K8010 Calculus of gallbladder with chronic cholecystitis without obstruction: Secondary | ICD-10-CM | POA: Diagnosis not present

## 2013-09-12 DIAGNOSIS — Z48815 Encounter for surgical aftercare following surgery on the digestive system: Secondary | ICD-10-CM | POA: Diagnosis not present

## 2013-09-12 DIAGNOSIS — K823 Fistula of gallbladder: Secondary | ICD-10-CM | POA: Diagnosis not present

## 2013-09-12 DIAGNOSIS — Z4801 Encounter for change or removal of surgical wound dressing: Secondary | ICD-10-CM | POA: Diagnosis not present

## 2013-09-13 DIAGNOSIS — K8010 Calculus of gallbladder with chronic cholecystitis without obstruction: Secondary | ICD-10-CM | POA: Diagnosis not present

## 2013-09-13 DIAGNOSIS — Z48815 Encounter for surgical aftercare following surgery on the digestive system: Secondary | ICD-10-CM | POA: Diagnosis not present

## 2013-09-13 DIAGNOSIS — K823 Fistula of gallbladder: Secondary | ICD-10-CM | POA: Diagnosis not present

## 2013-09-13 DIAGNOSIS — Z4801 Encounter for change or removal of surgical wound dressing: Secondary | ICD-10-CM | POA: Diagnosis not present

## 2013-09-15 DIAGNOSIS — K823 Fistula of gallbladder: Secondary | ICD-10-CM | POA: Diagnosis not present

## 2013-09-15 DIAGNOSIS — Z48815 Encounter for surgical aftercare following surgery on the digestive system: Secondary | ICD-10-CM | POA: Diagnosis not present

## 2013-09-15 DIAGNOSIS — Z4801 Encounter for change or removal of surgical wound dressing: Secondary | ICD-10-CM | POA: Diagnosis not present

## 2013-09-15 DIAGNOSIS — K8010 Calculus of gallbladder with chronic cholecystitis without obstruction: Secondary | ICD-10-CM | POA: Diagnosis not present

## 2013-09-18 ENCOUNTER — Encounter (INDEPENDENT_AMBULATORY_CARE_PROVIDER_SITE_OTHER): Payer: Medicare Other | Admitting: Surgery

## 2013-09-19 DIAGNOSIS — K823 Fistula of gallbladder: Secondary | ICD-10-CM | POA: Diagnosis not present

## 2013-09-19 DIAGNOSIS — Z48815 Encounter for surgical aftercare following surgery on the digestive system: Secondary | ICD-10-CM | POA: Diagnosis not present

## 2013-09-19 DIAGNOSIS — K8010 Calculus of gallbladder with chronic cholecystitis without obstruction: Secondary | ICD-10-CM | POA: Diagnosis not present

## 2013-09-19 DIAGNOSIS — Z4801 Encounter for change or removal of surgical wound dressing: Secondary | ICD-10-CM | POA: Diagnosis not present

## 2013-09-20 DIAGNOSIS — Z4801 Encounter for change or removal of surgical wound dressing: Secondary | ICD-10-CM | POA: Diagnosis not present

## 2013-09-20 DIAGNOSIS — Z48815 Encounter for surgical aftercare following surgery on the digestive system: Secondary | ICD-10-CM | POA: Diagnosis not present

## 2013-09-20 DIAGNOSIS — K8010 Calculus of gallbladder with chronic cholecystitis without obstruction: Secondary | ICD-10-CM | POA: Diagnosis not present

## 2013-09-20 DIAGNOSIS — K823 Fistula of gallbladder: Secondary | ICD-10-CM | POA: Diagnosis not present

## 2013-09-21 DIAGNOSIS — Z48815 Encounter for surgical aftercare following surgery on the digestive system: Secondary | ICD-10-CM | POA: Diagnosis not present

## 2013-09-21 DIAGNOSIS — Z4801 Encounter for change or removal of surgical wound dressing: Secondary | ICD-10-CM | POA: Diagnosis not present

## 2013-09-21 DIAGNOSIS — K823 Fistula of gallbladder: Secondary | ICD-10-CM | POA: Diagnosis not present

## 2013-09-21 DIAGNOSIS — K8010 Calculus of gallbladder with chronic cholecystitis without obstruction: Secondary | ICD-10-CM | POA: Diagnosis not present

## 2013-09-22 DIAGNOSIS — Z4801 Encounter for change or removal of surgical wound dressing: Secondary | ICD-10-CM | POA: Diagnosis not present

## 2013-09-22 DIAGNOSIS — Z48815 Encounter for surgical aftercare following surgery on the digestive system: Secondary | ICD-10-CM | POA: Diagnosis not present

## 2013-09-22 DIAGNOSIS — K8010 Calculus of gallbladder with chronic cholecystitis without obstruction: Secondary | ICD-10-CM | POA: Diagnosis not present

## 2013-09-22 DIAGNOSIS — K823 Fistula of gallbladder: Secondary | ICD-10-CM | POA: Diagnosis not present

## 2013-09-25 ENCOUNTER — Other Ambulatory Visit: Payer: Self-pay | Admitting: Internal Medicine

## 2013-09-26 DIAGNOSIS — Z4801 Encounter for change or removal of surgical wound dressing: Secondary | ICD-10-CM | POA: Diagnosis not present

## 2013-09-26 DIAGNOSIS — K823 Fistula of gallbladder: Secondary | ICD-10-CM | POA: Diagnosis not present

## 2013-09-26 DIAGNOSIS — K8010 Calculus of gallbladder with chronic cholecystitis without obstruction: Secondary | ICD-10-CM | POA: Diagnosis not present

## 2013-09-26 DIAGNOSIS — Z48815 Encounter for surgical aftercare following surgery on the digestive system: Secondary | ICD-10-CM | POA: Diagnosis not present

## 2013-09-28 DIAGNOSIS — S058X9A Other injuries of unspecified eye and orbit, initial encounter: Secondary | ICD-10-CM | POA: Diagnosis not present

## 2013-09-28 DIAGNOSIS — Z961 Presence of intraocular lens: Secondary | ICD-10-CM | POA: Diagnosis not present

## 2013-09-29 ENCOUNTER — Ambulatory Visit (INDEPENDENT_AMBULATORY_CARE_PROVIDER_SITE_OTHER): Payer: Medicare Other | Admitting: Internal Medicine

## 2013-09-29 ENCOUNTER — Encounter: Payer: Self-pay | Admitting: Internal Medicine

## 2013-09-29 ENCOUNTER — Other Ambulatory Visit: Payer: Self-pay | Admitting: Internal Medicine

## 2013-09-29 ENCOUNTER — Ambulatory Visit (HOSPITAL_BASED_OUTPATIENT_CLINIC_OR_DEPARTMENT_OTHER)
Admission: RE | Admit: 2013-09-29 | Discharge: 2013-09-29 | Disposition: A | Discharge status: Home / Self Care | Payer: Medicare Other | Source: Ambulatory Visit | Attending: Internal Medicine | Admitting: Internal Medicine

## 2013-09-29 VITALS — BP 138/58 | HR 65 | Temp 97.8°F | Wt 126.6 lb

## 2013-09-29 DIAGNOSIS — R0989 Other specified symptoms and signs involving the circulatory and respiratory systems: Secondary | ICD-10-CM | POA: Insufficient documentation

## 2013-09-29 DIAGNOSIS — I10 Essential (primary) hypertension: Secondary | ICD-10-CM

## 2013-09-29 DIAGNOSIS — Z48815 Encounter for surgical aftercare following surgery on the digestive system: Secondary | ICD-10-CM | POA: Diagnosis not present

## 2013-09-29 DIAGNOSIS — R059 Cough, unspecified: Secondary | ICD-10-CM | POA: Diagnosis not present

## 2013-09-29 DIAGNOSIS — Z23 Encounter for immunization: Secondary | ICD-10-CM | POA: Diagnosis not present

## 2013-09-29 DIAGNOSIS — R05 Cough: Secondary | ICD-10-CM

## 2013-09-29 DIAGNOSIS — K8010 Calculus of gallbladder with chronic cholecystitis without obstruction: Secondary | ICD-10-CM | POA: Diagnosis not present

## 2013-09-29 DIAGNOSIS — Z4801 Encounter for change or removal of surgical wound dressing: Secondary | ICD-10-CM | POA: Diagnosis not present

## 2013-09-29 DIAGNOSIS — K823 Fistula of gallbladder: Secondary | ICD-10-CM | POA: Diagnosis not present

## 2013-09-29 NOTE — Progress Notes (Signed)
Pre visit review using our clinic review tool, if applicable. No additional management support is needed unless otherwise documented below in the visit note. 

## 2013-09-29 NOTE — Progress Notes (Signed)
Subjective:    Patient ID: [name], male    DOB: 1926/12/30, 80 y.o.   MRN: [id number]  DOS:  09/29/2013 Type of visit - description : check up Interval history: Status post a cholecystectomy, recovering well.  C/o a lot of sputum production with minimal cough, denies runny nose, sore throat, itchy eyes or itchy nose. No nasal discharge No shortness of breath, wheezing or hemoptysis.  Was concerned about weight loss but that has been corrected.  Taking all the medications as prescribed, ambulatory BPs 120/60 on average or better  ROS See above  Past Medical History  Diagnosis Date  . Allergic rhinitis   . GERD (gastroesophageal reflux disease)     w/ esoph stricture  . Diabetes mellitus   . Hyperlipidemia   . Hypertension   . BPH (benign prostatic hypertrophy)   . Osteoarthritis   . Stroke     CVA, per CT "old stroke"  . Internal hemorrhoids   . Anemia   . History of colonic polyps   . Polyneuropathy     NCS by neurology per pt 5/07  . Anxiety   . Allergic rhinitis   . Neuropathy     Past Surgical History  Procedure Laterality Date  . Total knee arthroplasty  05/2006    (R)  . Total knee arthroplasty  08/2006    (L)  . Hernia repair      bil inguinal- per pt L in 90s, R in the 80s  . Cataract extraction Bilateral 01/2009  . Cardiovascular stress test  02-2010    done in [place], had CP, +EKG changes, neg nuclear  imagins  . Ercp w/ sphicterotomy      ????? Dr Deatra Ina  . Cholecystectomy N/A 08/16/2013    Procedure: LAPAROSCOPIC CONVERTED TO OPEN  CHOLECYSTECTOMY WITH INTRAOPERATIVE CHOLANGIOGRAM;  Surgeon: Imogene Burn. Georgette Dover, MD;  Location: [place];  Service: General;  Laterality: N/A;    History   Social History  . Marital Status: Married    Spouse Name: N/A    Number of Children: 2  . Years of Education: N/A   Occupational History  . retired    Social History Main Topics  . Smoking status: Former Research scientist (life sciences)  . Smokeless tobacco: Former Systems developer    Quit  date: 01/05/1961  . Alcohol Use: No  . Drug Use: No  . Sexual Activity: Not on file   Other Topics Concern  . Not on file   Social History Narrative   Retired, Married, original from [place], lives w/ wife     has two children, lost a son , has a daughter              Medication List       This list is accurate as of: 09/29/13  4:47 PM.  Always use your most recent med list.               amLODipine 5 MG tablet  Commonly known as:  NORVASC  TAKE 1 TABLET BY MOUTH DAILY     aspirin EC 81 MG tablet  Take 81 mg by mouth daily.     atenolol 50 MG tablet  Commonly known as:  TENORMIN  Take 25 mg by mouth daily.     finasteride 5 MG tablet  Commonly known as:  PROSCAR  Take 1 tablet (5 mg total) by mouth daily.     omeprazole 20 MG capsule  Commonly known as:  PRILOSEC  Take 20 mg  by mouth 2 (two) times daily.     simvastatin 20 MG tablet  Commonly known as:  ZOCOR  Take 0.5 tablets (10 mg total) by mouth at bedtime.     valsartan 160 MG tablet  Commonly known as:  DIOVAN  Take 1 tablet (160 mg total) by mouth daily.           Objective:   Physical Exam BP 138/58  Pulse 65  Temp(Src) 97.8 F (36.6 C) (Oral)  Wt 126 lb 9 oz (57.408 kg)  SpO2 98% General -- alert, well-developed, NAD.   Lungs -- normal respiratory effort, no intercostal retractions, no accessory muscle use, and Few rhonchi that clear with cough. No wheezing, no crackles. Heart-- normal rate, regular rhythm, no murmur.  Extremities-- no pretibial edema bilaterally  Neurologic--  alert & oriented X3. Speech normal, gait appropriate for age, strength symmetric and appropriate for age.  Psych-- Cognition and judgment appear intact. Cooperative with normal attention span and concentration. No anxious or depressed appearing.        Assessment & Plan:   Cough,  Minimal cough with some sputum production, no red flag symptoms, to be sure will get a chest x-ray, he has incentive spirometry at  home because recent surgery and I encouraged him to continue doing that. See instructions.  Request a high dose of flu shot, pros and cons discussed, elected the high dose vs regular shot

## 2013-09-29 NOTE — Assessment & Plan Note (Signed)
Well-controlled, no change 

## 2013-09-29 NOTE — Patient Instructions (Signed)
Stop by the first floor and get the XR   If you have a lot phlegm  take Robitussin as needed If you get  worse -- let me know Continue doing your respiratory exercises   Please come back to the office in 3 months  for a physical exam. Come back fasting    Stop by the front desk and schedule the visit

## 2013-10-02 DIAGNOSIS — Z4801 Encounter for change or removal of surgical wound dressing: Secondary | ICD-10-CM | POA: Diagnosis not present

## 2013-10-02 DIAGNOSIS — K823 Fistula of gallbladder: Secondary | ICD-10-CM | POA: Diagnosis not present

## 2013-10-02 DIAGNOSIS — K8010 Calculus of gallbladder with chronic cholecystitis without obstruction: Secondary | ICD-10-CM | POA: Diagnosis not present

## 2013-10-02 DIAGNOSIS — Z48815 Encounter for surgical aftercare following surgery on the digestive system: Secondary | ICD-10-CM | POA: Diagnosis not present

## 2013-10-03 DIAGNOSIS — K823 Fistula of gallbladder: Secondary | ICD-10-CM | POA: Diagnosis not present

## 2013-10-03 DIAGNOSIS — Z48815 Encounter for surgical aftercare following surgery on the digestive system: Secondary | ICD-10-CM | POA: Diagnosis not present

## 2013-10-03 DIAGNOSIS — Z4801 Encounter for change or removal of surgical wound dressing: Secondary | ICD-10-CM | POA: Diagnosis not present

## 2013-10-03 DIAGNOSIS — K8010 Calculus of gallbladder with chronic cholecystitis without obstruction: Secondary | ICD-10-CM | POA: Diagnosis not present

## 2013-10-26 ENCOUNTER — Telehealth: Payer: Self-pay | Admitting: Gastroenterology

## 2013-10-26 NOTE — Telephone Encounter (Signed)
Spoke with the patient. He denies any current issues, but would like to have a follow up appointment with Dr Deatra Ina. Appointment scheduled for 12/04/13 at 10:30 am. He has GERD and history of esophageal stricture.

## 2013-10-26 NOTE — Telephone Encounter (Signed)
I have left message for the patient to call back 

## 2013-10-31 ENCOUNTER — Other Ambulatory Visit: Payer: Self-pay | Admitting: Internal Medicine

## 2013-11-07 ENCOUNTER — Other Ambulatory Visit (INDEPENDENT_AMBULATORY_CARE_PROVIDER_SITE_OTHER): Payer: Self-pay | Admitting: Surgery

## 2013-11-07 DIAGNOSIS — K823 Fistula of gallbladder: Secondary | ICD-10-CM

## 2013-11-14 ENCOUNTER — Ambulatory Visit
Admission: RE | Admit: 2013-11-14 | Discharge: 2013-11-14 | Disposition: A | Discharge status: Home / Self Care | Payer: Medicare Other | Source: Ambulatory Visit | Attending: Surgery | Admitting: Surgery

## 2013-11-14 DIAGNOSIS — K449 Diaphragmatic hernia without obstruction or gangrene: Secondary | ICD-10-CM | POA: Diagnosis not present

## 2013-11-14 DIAGNOSIS — K823 Fistula of gallbladder: Secondary | ICD-10-CM

## 2013-11-14 DIAGNOSIS — K4090 Unilateral inguinal hernia, without obstruction or gangrene, not specified as recurrent: Secondary | ICD-10-CM | POA: Diagnosis not present

## 2013-11-14 DIAGNOSIS — Z9049 Acquired absence of other specified parts of digestive tract: Secondary | ICD-10-CM | POA: Diagnosis not present

## 2013-11-14 MED ORDER — IOHEXOL 300 MG/ML  SOLN: 100.0000 mL | Freq: Once | INTRAMUSCULAR | Status: AC | PRN

## 2013-12-04 ENCOUNTER — Other Ambulatory Visit: Payer: Self-pay

## 2013-12-04 ENCOUNTER — Encounter: Payer: Self-pay | Admitting: Gastroenterology

## 2013-12-04 ENCOUNTER — Ambulatory Visit (INDEPENDENT_AMBULATORY_CARE_PROVIDER_SITE_OTHER): Payer: Medicare Other | Admitting: Gastroenterology

## 2013-12-04 VITALS — BP 138/60 | HR 68 | Ht 63.5 in | Wt 131.4 lb

## 2013-12-04 DIAGNOSIS — K8010 Calculus of gallbladder with chronic cholecystitis without obstruction: Secondary | ICD-10-CM

## 2013-12-04 NOTE — Progress Notes (Signed)
      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  He has slowly regained strength and is feeling well.  There was some concern about a ventral hernia.  Recent CT demonstrated a left inguinal hernia.  He is asymptomatic.    Review of Systems: Pertinent positive and negative review of systems were noted in the above HPI section. All other review of systems were otherwise negative.    Current Medications, Allergies, Past Medical History, Past Surgical History, Family History and Social History were reviewed in [facility] record  Vital signs were reviewed in today's medical record. Physical Exam: General: Well developed , well nourished, no acute distress On abdominal exam there is a well-healed surgical incision.  There are no obvious hernias   See Assessment and Plan under Problem List

## 2013-12-04 NOTE — Patient Instructions (Signed)
Follow up as needed

## 2013-12-04 NOTE — Assessment & Plan Note (Signed)
  Patient has had a slow but unremarkable recovery.

## 2013-12-07 ENCOUNTER — Ambulatory Visit: Payer: Medicare Other | Admitting: Podiatry

## 2013-12-15 ENCOUNTER — Ambulatory Visit: Payer: Medicare Other

## 2013-12-25 DIAGNOSIS — R972 Elevated prostate specific antigen [PSA]: Secondary | ICD-10-CM | POA: Diagnosis not present

## 2013-12-25 DIAGNOSIS — R350 Frequency of micturition: Secondary | ICD-10-CM | POA: Diagnosis not present

## 2013-12-25 DIAGNOSIS — N401 Enlarged prostate with lower urinary tract symptoms: Secondary | ICD-10-CM | POA: Diagnosis not present

## 2013-12-28 ENCOUNTER — Encounter: Payer: Self-pay | Admitting: Internal Medicine

## 2013-12-28 ENCOUNTER — Ambulatory Visit (INDEPENDENT_AMBULATORY_CARE_PROVIDER_SITE_OTHER): Payer: Medicare Other | Admitting: Internal Medicine

## 2013-12-28 ENCOUNTER — Telehealth: Payer: Self-pay | Admitting: *Deleted

## 2013-12-28 VITALS — BP 144/64 | HR 70 | Temp 97.9°F | Ht 65.0 in | Wt 128.2 lb

## 2013-12-28 DIAGNOSIS — I10 Essential (primary) hypertension: Secondary | ICD-10-CM

## 2013-12-28 DIAGNOSIS — R7303 Prediabetes: Secondary | ICD-10-CM

## 2013-12-28 DIAGNOSIS — R7309 Other abnormal glucose: Secondary | ICD-10-CM

## 2013-12-28 DIAGNOSIS — D649 Anemia, unspecified: Secondary | ICD-10-CM

## 2013-12-28 DIAGNOSIS — E785 Hyperlipidemia, unspecified: Secondary | ICD-10-CM

## 2013-12-28 LAB — LIPID PANEL
Cholesterol: 103 mg/dL (ref 0–200)
HDL: 37.4 mg/dL — ABNORMAL LOW (ref >39.00)
LDL Cholesterol: 42 mg/dL (ref 0–99)
NonHDL: 65.6
Total CHOL/HDL Ratio: 3
Triglycerides: 119 mg/dL (ref 0.0–149.0)
VLDL: 23.8 mg/dL (ref 0.0–40.0)

## 2013-12-28 LAB — BASIC METABOLIC PANEL
BUN: 12 mg/dL (ref 6–23)
CO2: 25 mEq/L (ref 19–32)
Calcium: 9.2 mg/dL (ref 8.4–10.5)
Chloride: 102 mEq/L (ref 96–112)
Creatinine, Ser: 0.8 mg/dL (ref 0.4–1.5)
GFR: 94.8 mL/min (ref >60.00)
Glucose, Bld: 111 mg/dL — ABNORMAL HIGH (ref 70–99)
Potassium: 4 mEq/L (ref 3.5–5.1)
Sodium: 136 mEq/L (ref 135–145)

## 2013-12-28 LAB — HEMOGLOBIN A1C: Hgb A1c MFr Bld: 6.2 % (ref 4.6–6.5)

## 2013-12-28 LAB — HEMOGLOBIN: Hemoglobin: 11.8 g/dL — ABNORMAL LOW (ref 13.0–17.0)

## 2013-12-28 MED ORDER — FINASTERIDE 5 MG PO TABS: 5.0000 mg | ORAL_TABLET | Freq: Every day | ORAL | Status: DC

## 2013-12-28 MED ORDER — SIMVASTATIN 20 MG PO TABS: ORAL_TABLET | ORAL | Status: DC

## 2013-12-28 MED ORDER — ATENOLOL 50 MG PO TABS: 25.0000 mg | ORAL_TABLET | Freq: Every day | ORAL | Status: DC

## 2013-12-28 MED ORDER — AMLODIPINE BESYLATE 5 MG PO TABS: 5.0000 mg | ORAL_TABLET | Freq: Every day | ORAL | Status: DC

## 2013-12-28 MED ORDER — VALSARTAN 160 MG PO TABS: 160.0000 mg | ORAL_TABLET | Freq: Every day | ORAL | Status: DC

## 2013-12-28 MED ORDER — OMEPRAZOLE 20 MG PO CPDR: 20.0000 mg | DELAYED_RELEASE_CAPSULE | Freq: Two times a day (BID) | ORAL | Status: DC

## 2013-12-28 NOTE — Assessment & Plan Note (Signed)
BP slightly elevated today but at home is very good. Continue amlodipine, Tenormin, Diovan. Recheck a BMP

## 2013-12-28 NOTE — Progress Notes (Signed)
Pre visit review using our clinic review tool, if applicable. No additional management support is needed unless otherwise documented below in the visit note. 

## 2013-12-28 NOTE — Assessment & Plan Note (Signed)
Good compliance with simvastatin w/o apparent s/e, check FLP Recent LFTs normal

## 2013-12-28 NOTE — Telephone Encounter (Signed)
Prior authorization for Amlodipine initiated. Awaiting determination. JG//CMA

## 2013-12-28 NOTE — Progress Notes (Signed)
Subjective:    Patient ID: [name], male    DOB: May 01, 1926, 80 y.o.   MRN: [id number]  DOS:  12/28/2013 Type of visit - description : rov Interval history: Follow-up for hypertension, hyperglycemia, hyperlipidemia. Good compliance with medications without apparent side effects Ambulatory BPs are great, range from 542-706 with a diastolic in the 23J. Labs reviewed  ROS Denies chest pain, difficulty breathing. No nausea, vomiting, diarrhea or blood in the stools  Past Medical History  Diagnosis Date  . Allergic rhinitis   . GERD (gastroesophageal reflux disease)     w/ esoph stricture  . Diabetes mellitus   . Hyperlipidemia   . Hypertension   . BPH (benign prostatic hypertrophy)   . Osteoarthritis   . Stroke     CVA, per CT "old stroke"  . Internal hemorrhoids   . Anemia   . History of colonic polyps   . Polyneuropathy     NCS by neurology per pt 5/07  . Anxiety   . Allergic rhinitis   . Neuropathy   . Hiatal hernia   . Inguinal hernia, left     Past Surgical History  Procedure Laterality Date  . Total knee arthroplasty  05/2006    (R)  . Total knee arthroplasty  08/2006    (L)  . Hernia repair      bil inguinal- per pt L in 90s, R in the 80s  . Cataract extraction Bilateral 01/2009  . Cardiovascular stress test  02-2010    done in [place], had CP, +EKG changes, neg nuclear  imagins  . Ercp w/ sphicterotomy      ????? Dr Deatra Ina  . Cholecystectomy N/A 08/16/2013    Procedure: LAPAROSCOPIC CONVERTED TO OPEN  CHOLECYSTECTOMY WITH INTRAOPERATIVE CHOLANGIOGRAM;  Surgeon: Imogene Burn. Georgette Dover, MD;  Location: [place];  Service: General;  Laterality: N/A;    History   Social History  . Marital Status: Married    Spouse Name: N/A    Number of Children: 2  . Years of Education: N/A   Occupational History  . retired    Social History Main Topics  . Smoking status: Former Research scientist (life sciences)  . Smokeless tobacco: Former Systems developer    Quit date: 01/05/1961  . Alcohol Use: No  . Drug  Use: No  . Sexual Activity: Not on file   Other Topics Concern  . Not on file   Social History Narrative   Retired, Married, original from [place], lives w/ wife     has two children, lost a son , has a daughter              Medication List       This list is accurate as of: 12/28/13 11:59 PM.  Always use your most recent med list.               amLODipine 5 MG tablet  Commonly known as:  NORVASC  Take 1 tablet (5 mg total) by mouth daily.     aspirin EC 81 MG tablet  Take 81 mg by mouth daily.     atenolol 50 MG tablet  Commonly known as:  TENORMIN  Take 0.5 tablets (25 mg total) by mouth daily.     finasteride 5 MG tablet  Commonly known as:  PROSCAR  Take 1 tablet (5 mg total) by mouth daily.     omeprazole 20 MG capsule  Commonly known as:  PRILOSEC  Take 1 capsule (20 mg total) by mouth 2 (  two) times daily.     simvastatin 20 MG tablet  Commonly known as:  ZOCOR  TAKE 1/2 TABLET BY MOUTH EVERY NIGHT AT BEDTIME     valsartan 160 MG tablet  Commonly known as:  DIOVAN  Take 1 tablet (160 mg total) by mouth daily.           Objective:   Physical Exam BP 144/64 mmHg  Pulse 70  Temp(Src) 97.9 F (36.6 C) (Oral)  Ht 5\' 5"  (1.651 m)  Wt 128 lb 4 oz (58.174 kg)  BMI 21.34 kg/m2  SpO2 100% General -- alert, well-developed, NAD.  Lungs -- normal respiratory effort, no intercostal retractions, no accessory muscle use, and normal breath sounds.  Heart-- normal rate, regular rhythm, no murmur.   Extremities-- no pretibial edema bilaterally  Neurologic--  alert & oriented X3. Speech normal, gait appropriate for age, strength symmetric and appropriate for age.  Psych-- Cognition and judgment appear intact. Cooperative with normal attention span and concentration. No anxious or depressed appearing.        Assessment & Plan:

## 2013-12-28 NOTE — Assessment & Plan Note (Signed)
Due for  A A1c

## 2013-12-28 NOTE — Patient Instructions (Signed)
Get your blood work before you leave     Please come back to the office in 4 months  for a physical exam. No  fasting

## 2013-12-28 NOTE — Telephone Encounter (Signed)
No PA needed for amlodipine. Notified [facility]. JG//CMA

## 2014-01-02 ENCOUNTER — Ambulatory Visit (INDEPENDENT_AMBULATORY_CARE_PROVIDER_SITE_OTHER): Payer: Medicare Other | Admitting: Podiatry

## 2014-01-02 VITALS — BP 136/65 | HR 65 | Resp 18

## 2014-01-02 DIAGNOSIS — M79673 Pain in unspecified foot: Secondary | ICD-10-CM

## 2014-01-02 DIAGNOSIS — B351 Tinea unguium: Secondary | ICD-10-CM | POA: Diagnosis not present

## 2014-01-03 NOTE — Progress Notes (Signed)
He presents today with chief complaint of painful elongated toenails.  Objective: Nails are thick yellow dystrophic mycotic and painful palpation. Pulses are palpable bilateral.  Assessment: Osteoarthritis second metatarsophalangeal joint right foot pain and limb secondary to onychomycosis 1 through 5 bilateral.  Plan: Debrided nails 1 through 5 bilateral covered service secondary to pain.

## 2014-01-05 DIAGNOSIS — K823 Fistula of gallbladder: Secondary | ICD-10-CM

## 2014-01-05 DIAGNOSIS — K4090 Unilateral inguinal hernia, without obstruction or gangrene, not specified as recurrent: Secondary | ICD-10-CM

## 2014-01-05 HISTORY — DX: Unilateral inguinal hernia, without obstruction or gangrene, not specified as recurrent: K40.90

## 2014-01-05 HISTORY — DX: Fistula of gallbladder: K82.3

## 2014-01-08 ENCOUNTER — Telehealth: Payer: Self-pay | Admitting: Internal Medicine

## 2014-01-08 NOTE — Telephone Encounter (Signed)
Sodium and Hemoglobin levels were in normal ranges when checked on 12/28/2013. Please advise Pt no medications were sent since they were normal.

## 2014-01-08 NOTE — Telephone Encounter (Signed)
Caller name: Griffen, Frayne Relation to pt: self  Call back number: [phone]   Reason for call:  Pt states sodium and hemglobin levels are low based on last appointment pt would like to know if there were any medications sent over to pharmacy.

## 2014-01-09 ENCOUNTER — Telehealth: Payer: Self-pay | Admitting: *Deleted

## 2014-01-09 ENCOUNTER — Other Ambulatory Visit: Payer: Self-pay

## 2014-01-09 MED ORDER — FINASTERIDE 5 MG PO TABS: 5.0000 mg | ORAL_TABLET | Freq: Every day | ORAL | Status: DC

## 2014-01-09 MED ORDER — ATENOLOL 50 MG PO TABS: 25.0000 mg | ORAL_TABLET | Freq: Every day | ORAL | Status: DC

## 2014-01-09 MED ORDER — SIMVASTATIN 20 MG PO TABS: ORAL_TABLET | ORAL | Status: DC

## 2014-01-09 MED ORDER — OMEPRAZOLE 20 MG PO CPDR: 20.0000 mg | DELAYED_RELEASE_CAPSULE | Freq: Two times a day (BID) | ORAL | Status: DC

## 2014-01-09 MED ORDER — AMLODIPINE BESYLATE 5 MG PO TABS: 5.0000 mg | ORAL_TABLET | Freq: Every day | ORAL | Status: DC

## 2014-01-09 MED ORDER — VALSARTAN 160 MG PO TABS: 160.0000 mg | ORAL_TABLET | Freq: Every day | ORAL | Status: DC

## 2014-01-09 NOTE — Telephone Encounter (Signed)
90 day supply of medication sent to [facility] as requested.

## 2014-01-09 NOTE — Telephone Encounter (Signed)
Caller name: Tequan Relation to pt: self Call back number: [phone] Pharmacy: [facility] mail order  Reason for call: Pt states he will need 90 day supply for all medications sent to mail order.  Pt request call, new insurance and mail order. New insurance is in pt chart.

## 2014-01-22 ENCOUNTER — Other Ambulatory Visit: Payer: Self-pay | Admitting: Internal Medicine

## 2014-02-14 DIAGNOSIS — R972 Elevated prostate specific antigen [PSA]: Secondary | ICD-10-CM | POA: Diagnosis not present

## 2014-02-15 ENCOUNTER — Telehealth: Payer: Self-pay | Admitting: Internal Medicine

## 2014-02-15 NOTE — Telephone Encounter (Signed)
Last physical was 05/2012, okay to schedule at his convenience

## 2014-02-15 NOTE — Telephone Encounter (Signed)
Caller name: Doy Relation to pt: self Call back number: [phone] Pharmacy:  Reason for call:  Patient states that he needs to schedule a cpe. He says that Dr. [name] did not do a cpe at last visit. Please advise as to when patient needs to be seen for cpe.

## 2014-02-15 NOTE — Telephone Encounter (Signed)
Pt called wanting to schedule CPE, per chart he was scheduled for a CPE on 12/28/2013, was CPE completed this date?

## 2014-02-15 NOTE — Telephone Encounter (Signed)
Pt's last CPE was 05/2012 per Dr. [name], [place] may schedule CPE at his convenience.

## 2014-02-20 NOTE — Telephone Encounter (Signed)
Appointment scheduled.

## 2014-02-21 ENCOUNTER — Encounter: Payer: Medicare Other | Admitting: Internal Medicine

## 2014-03-19 ENCOUNTER — Telehealth: Payer: Self-pay | Admitting: Internal Medicine

## 2014-03-19 DIAGNOSIS — D649 Anemia, unspecified: Secondary | ICD-10-CM

## 2014-03-19 NOTE — Telephone Encounter (Signed)
Last BMP normal, no need to repeat it .  He does need a CBC, DX anemia. He is due for a physical at some point in April

## 2014-03-19 NOTE — Telephone Encounter (Signed)
Please advise 

## 2014-03-19 NOTE — Telephone Encounter (Signed)
Caller name: Damen Relation to pt: self Call back number: [phone] Pharmacy:  Reason for call:   [place] of [place] and is requesting to have labs done before he leaves. Regarding low sodium and low blood count.

## 2014-03-20 NOTE — Telephone Encounter (Signed)
Left message for patient to return my call.

## 2014-03-20 NOTE — Telephone Encounter (Signed)
Pt scheduled lab appointment for 03/22/14

## 2014-03-20 NOTE — Telephone Encounter (Signed)
CBC ordered. Pt can come into office at his earliest convenience for labs.

## 2014-03-22 ENCOUNTER — Other Ambulatory Visit (INDEPENDENT_AMBULATORY_CARE_PROVIDER_SITE_OTHER): Payer: Medicare Other

## 2014-03-22 DIAGNOSIS — R7309 Other abnormal glucose: Secondary | ICD-10-CM | POA: Diagnosis not present

## 2014-03-22 DIAGNOSIS — D649 Anemia, unspecified: Secondary | ICD-10-CM | POA: Diagnosis not present

## 2014-03-22 DIAGNOSIS — R7303 Prediabetes: Secondary | ICD-10-CM

## 2014-03-22 LAB — BASIC METABOLIC PANEL
BUN: 12 mg/dL (ref 6–23)
CO2: 30 mEq/L (ref 19–32)
Calcium: 9.2 mg/dL (ref 8.4–10.5)
Chloride: 104 mEq/L (ref 96–112)
Creatinine, Ser: 0.85 mg/dL (ref 0.40–1.50)
GFR: 90.9 mL/min (ref >60.00)
Glucose, Bld: 103 mg/dL — ABNORMAL HIGH (ref 70–99)
Potassium: 4 mEq/L (ref 3.5–5.1)
Sodium: 138 mEq/L (ref 135–145)

## 2014-03-22 LAB — CBC WITH DIFFERENTIAL/PLATELET
Basophils Absolute: 0 10*3/uL (ref 0.0–0.1)
Basophils Relative: 0.7 % (ref 0.0–3.0)
Eosinophils Absolute: 0.4 10*3/uL (ref 0.0–0.7)
Eosinophils Relative: 8 % — ABNORMAL HIGH (ref 0.0–5.0)
HCT: 35.3 % — ABNORMAL LOW (ref 39.0–52.0)
Hemoglobin: 11.9 g/dL — ABNORMAL LOW (ref 13.0–17.0)
Lymphocytes Relative: 39 % (ref 12.0–46.0)
Lymphs Abs: 2.1 10*3/uL (ref 0.7–4.0)
MCHC: 33.6 g/dL (ref 30.0–36.0)
MCV: 79.2 fl (ref 78.0–100.0)
Monocytes Absolute: 0.6 10*3/uL (ref 0.1–1.0)
Monocytes Relative: 10.8 % (ref 3.0–12.0)
Neutro Abs: 2.2 10*3/uL (ref 1.4–7.7)
Neutrophils Relative %: 41.5 % — ABNORMAL LOW (ref 43.0–77.0)
Platelets: 176 10*3/uL (ref 150.0–400.0)
RBC: 4.45 Mil/uL (ref 4.22–5.81)
RDW: 16.2 % — ABNORMAL HIGH (ref 11.5–15.5)
WBC: 5.3 10*3/uL (ref 4.0–10.5)

## 2014-04-10 DIAGNOSIS — R972 Elevated prostate specific antigen [PSA]: Secondary | ICD-10-CM | POA: Diagnosis not present

## 2014-04-11 ENCOUNTER — Other Ambulatory Visit: Payer: Self-pay

## 2014-04-16 DIAGNOSIS — K432 Incisional hernia without obstruction or gangrene: Secondary | ICD-10-CM | POA: Diagnosis not present

## 2014-06-13 ENCOUNTER — Other Ambulatory Visit: Payer: Self-pay | Admitting: Internal Medicine

## 2014-06-14 ENCOUNTER — Telehealth: Payer: Self-pay | Admitting: Internal Medicine

## 2014-06-14 NOTE — Telephone Encounter (Signed)
Pre Visit letter sent  °

## 2014-06-28 ENCOUNTER — Other Ambulatory Visit: Payer: Self-pay

## 2014-07-04 ENCOUNTER — Telehealth: Payer: Self-pay | Admitting: Behavioral Health

## 2014-07-04 NOTE — Telephone Encounter (Signed)
Unable to reach patient at time of Pre-Visit Call.  Patient's voice mailbox is full and message could not be left.    Also, attempted to reach patient on home phone, but a recording immediately came on, stating unavailable.

## 2014-07-05 ENCOUNTER — Encounter: Payer: Medicare Other | Admitting: Internal Medicine

## 2014-08-14 ENCOUNTER — Ambulatory Visit: Payer: Medicare Other | Admitting: Podiatry

## 2014-08-16 ENCOUNTER — Other Ambulatory Visit: Payer: Self-pay | Admitting: Internal Medicine

## 2014-08-16 ENCOUNTER — Telehealth: Payer: Self-pay | Admitting: Internal Medicine

## 2014-08-16 NOTE — Telephone Encounter (Signed)
Error/gd °

## 2014-08-21 ENCOUNTER — Encounter: Payer: Self-pay | Admitting: Internal Medicine

## 2014-08-21 ENCOUNTER — Ambulatory Visit (INDEPENDENT_AMBULATORY_CARE_PROVIDER_SITE_OTHER): Payer: Medicare Other | Admitting: Internal Medicine

## 2014-08-21 ENCOUNTER — Other Ambulatory Visit (INDEPENDENT_AMBULATORY_CARE_PROVIDER_SITE_OTHER): Payer: Medicare Other

## 2014-08-21 VITALS — BP 152/74 | HR 56 | Temp 98.2°F | Ht 65.0 in | Wt 135.2 lb

## 2014-08-21 DIAGNOSIS — E785 Hyperlipidemia, unspecified: Secondary | ICD-10-CM

## 2014-08-21 DIAGNOSIS — F411 Generalized anxiety disorder: Secondary | ICD-10-CM

## 2014-08-21 DIAGNOSIS — D649 Anemia, unspecified: Secondary | ICD-10-CM | POA: Diagnosis not present

## 2014-08-21 DIAGNOSIS — Z0000 Encounter for general adult medical examination without abnormal findings: Secondary | ICD-10-CM

## 2014-08-21 DIAGNOSIS — I10 Essential (primary) hypertension: Secondary | ICD-10-CM

## 2014-08-21 DIAGNOSIS — R7303 Prediabetes: Secondary | ICD-10-CM

## 2014-08-21 DIAGNOSIS — R7309 Other abnormal glucose: Secondary | ICD-10-CM | POA: Diagnosis not present

## 2014-08-21 DIAGNOSIS — K439 Ventral hernia without obstruction or gangrene: Secondary | ICD-10-CM | POA: Insufficient documentation

## 2014-08-21 LAB — IRON: Iron: 41 ug/dL — ABNORMAL LOW (ref 42–165)

## 2014-08-21 LAB — TSH: TSH: 4.76 u[IU]/mL — ABNORMAL HIGH (ref 0.35–4.50)

## 2014-08-21 LAB — CBC WITH DIFFERENTIAL/PLATELET
Basophils Absolute: 0 10*3/uL (ref 0.0–0.1)
Basophils Relative: 0.7 % (ref 0.0–3.0)
Eosinophils Absolute: 0.5 10*3/uL (ref 0.0–0.7)
Eosinophils Relative: 7.9 % — ABNORMAL HIGH (ref 0.0–5.0)
HCT: 36.9 % — ABNORMAL LOW (ref 39.0–52.0)
Hemoglobin: 12 g/dL — ABNORMAL LOW (ref 13.0–17.0)
Lymphocytes Relative: 31 % (ref 12.0–46.0)
Lymphs Abs: 2 10*3/uL (ref 0.7–4.0)
MCHC: 32.5 g/dL (ref 30.0–36.0)
MCV: 78.8 fl (ref 78.0–100.0)
Monocytes Absolute: 0.5 10*3/uL (ref 0.1–1.0)
Monocytes Relative: 7.9 % (ref 3.0–12.0)
Neutro Abs: 3.4 10*3/uL (ref 1.4–7.7)
Neutrophils Relative %: 52.5 % (ref 43.0–77.0)
Platelets: 180 10*3/uL (ref 150.0–400.0)
RBC: 4.68 Mil/uL (ref 4.22–5.81)
RDW: 17.2 % — ABNORMAL HIGH (ref 11.5–15.5)
WBC: 6.5 10*3/uL (ref 4.0–10.5)

## 2014-08-21 LAB — HEMOGLOBIN A1C: Hgb A1c MFr Bld: 6.3 % (ref 4.6–6.5)

## 2014-08-21 LAB — BASIC METABOLIC PANEL
BUN: 10 mg/dL (ref 6–23)
CO2: 30 mEq/L (ref 19–32)
Calcium: 9.5 mg/dL (ref 8.4–10.5)
Chloride: 102 mEq/L (ref 96–112)
Creatinine, Ser: 0.86 mg/dL (ref 0.40–1.50)
GFR: 89.59 mL/min (ref >60.00)
Glucose, Bld: 134 mg/dL — ABNORMAL HIGH (ref 70–99)
Potassium: 4.1 mEq/L (ref 3.5–5.1)
Sodium: 138 mEq/L (ref 135–145)

## 2014-08-21 LAB — ALT: ALT: 16 U/L (ref 0–53)

## 2014-08-21 LAB — AST: AST: 21 U/L (ref 0–37)

## 2014-08-21 LAB — FERRITIN: Ferritin: 8.2 ng/mL — ABNORMAL LOW (ref 22.0–322.0)

## 2014-08-21 MED ORDER — AMLODIPINE BESYLATE 5 MG PO TABS: 5.0000 mg | ORAL_TABLET | Freq: Every day | ORAL | Status: DC

## 2014-08-21 MED ORDER — AMBULATORY NON FORMULARY MEDICATION: Status: AC

## 2014-08-21 MED ORDER — FINASTERIDE 5 MG PO TABS: 5.0000 mg | ORAL_TABLET | Freq: Every day | ORAL | Status: DC

## 2014-08-21 MED ORDER — SIMVASTATIN 10 MG PO TABS: 10.0000 mg | ORAL_TABLET | Freq: Every day | ORAL | Status: DC

## 2014-08-21 MED ORDER — VALSARTAN 160 MG PO TABS: 160.0000 mg | ORAL_TABLET | Freq: Every day | ORAL | Status: DC

## 2014-08-21 MED ORDER — ATENOLOL 50 MG PO TABS: 25.0000 mg | ORAL_TABLET | Freq: Every day | ORAL | Status: DC

## 2014-08-21 NOTE — Patient Instructions (Signed)
Get your blood work before you leave      [place] cause injuries and can affect all age groups. It is possible to use preventive measures to significantly decrease the likelihood of falls. There are many simple measures which can make your home safer and prevent falls. OUTDOORS  Repair cracks and edges of walkways and driveways.  Remove high doorway thresholds.  Trim shrubbery on the main path into your home.  Have good outside lighting.  Clear walkways of tools, rocks, debris, and clutter.  Check that handrails are not broken and are securely fastened. Both sides of steps should have handrails.  Have leaves, snow, and ice cleared regularly.  Use sand or salt on walkways during winter months.  In the garage, clean up grease or oil spills. BATHROOM  Install night lights.  Install grab bars by the toilet and in the tub and shower.  Use non-skid mats or decals in the tub or shower.  Place a plastic non-slip stool in the shower to sit on, if needed.  Keep floors dry and clean up all water on the floor immediately.  Remove soap buildup in the tub or shower on a regular basis.  Secure bath mats with non-slip, double-sided rug tape.  Remove throw rugs and tripping hazards from the floors. BEDROOMS  Install night lights.  Make sure a bedside light is easy to reach.  Do not use oversized bedding.  Keep a telephone by your bedside.  Have a firm chair with side arms to use for getting dressed.  Remove throw rugs and tripping hazards from the floor. KITCHEN  Keep handles on pots and pans turned toward the center of the stove. Use back burners when possible.  Clean up spills quickly and allow time for drying.  Avoid walking on wet floors.  Avoid hot utensils and knives.  Position shelves so they are not too high or low.  Place commonly used objects within easy reach.  If necessary, use a sturdy step stool with a grab bar when  reaching.  Keep electrical cables out of the way.  Do not use floor polish or wax that makes floors slippery. If you must use wax, use non-skid floor wax.  Remove throw rugs and tripping hazards from the floor. STAIRWAYS  Never leave objects on stairs.  Place handrails on both sides of stairways and use them. Fix any loose handrails. Make sure handrails on both sides of the stairways are as long as the stairs.  Check carpeting to make sure it is firmly attached along stairs. Make repairs to worn or loose carpet promptly.  Avoid placing throw rugs at the top or bottom of stairways, or properly secure the rug with carpet tape to prevent slippage. Get rid of throw rugs, if possible.  Have an electrician put in a light switch at the top and bottom of the stairs. OTHER FALL PREVENTION TIPS  Wear low-heel or rubber-soled shoes that are supportive and fit well. Wear closed toe shoes.  When using a stepladder, make sure it is fully opened and both spreaders are firmly locked. Do not climb a closed stepladder.  Add color or contrast paint or tape to grab bars and handrails in your home. Place contrasting color strips on first and last steps.  Learn and use mobility aids as needed. Install an electrical emergency response system.  Turn on lights to avoid dark areas. Replace light bulbs that burn out immediately. Get light switches that glow.  Arrange furniture to create clear pathways. Keep furniture in the same place.  Firmly attach carpet with non-skid or double-sided tape.  Eliminate uneven floor surfaces.  Select a carpet pattern that does not visually hide the edge of steps.  Be aware of all pets. OTHER HOME SAFETY TIPS  Set the water temperature for 120 F (48.8 C).  Keep emergency numbers on or near the telephone.  Keep smoke detectors on every level of the home and near sleeping areas. Document Released: 12/12/2001 Document Revised: 06/23/2011 Document Reviewed:  03/13/2011 [facility] Patient Information 2015 [place], [place]. This information is not intended to replace advice given to you by your health care provider. Make sure you discuss any questions you have with your health care provider.   Preventive Care for Adults Ages 60 and over  Blood pressure check.** / Every 1 to 2 years.  Lipid and cholesterol check.**/ Every 5 years beginning at age 42.  Lung cancer screening. / Every year if you are aged 6-80 years and have a 30-pack-year history of smoking and currently smoke or have quit within the past 15 years. Yearly screening is stopped once you have quit smoking for at least 15 years or develop a health problem that would prevent you from having lung cancer treatment.  Fecal occult blood test (FOBT) of stool. / Every year beginning at age 13 and continuing until age 46. You may not have to do this test if you get a colonoscopy every 10 years.  Flexible sigmoidoscopy** or colonoscopy.** / Every 5 years for a flexible sigmoidoscopy or every 10 years for a colonoscopy beginning at age 27 and continuing until age 54.  Hepatitis C blood test.** / For all people born from [birth year] through 1963 and any individual with known risks for hepatitis C.  Abdominal aortic aneurysm (AAA) screening.** / A one-time screening for ages 31 to 39 years who are current or former smokers.  Skin self-exam. / Monthly.  Influenza vaccine. / Every year.  Tetanus, diphtheria, and acellular pertussis (Tdap/Td) vaccine.** / 1 dose of Td every 10 years.  Varicella vaccine.** / Consult your health care provider.  Zoster vaccine.** / 1 dose for adults aged 26 years or older.  Pneumococcal 13-valent conjugate (PCV13) vaccine.** / Consult your health care provider.  Pneumococcal polysaccharide (PPSV23) vaccine.** / 1 dose for all adults aged 61 years and older.  Meningococcal vaccine.** / Consult your health care provider.  Hepatitis A vaccine.** / Consult your health care  provider.  Hepatitis B vaccine.** / Consult your health care provider.  Haemophilus influenzae type b (Hib) vaccine.** / Consult your health care provider. **Family history and personal history of risk and conditions may change your health care provider's recommendations. Document Released: 02/17/2001 Document Revised: 12/27/2012 Document Reviewed: 05/19/2010 [facility] Patient Information 2015 [place], [place]. This information is not intended to replace advice given to you by your health care provider. Make sure you discuss any questions you have with your health care provider.

## 2014-08-21 NOTE — Assessment & Plan Note (Signed)
Check a CBC

## 2014-08-21 NOTE — Progress Notes (Signed)
Pre visit review using our clinic review tool, if applicable. No additional management support is needed unless otherwise documented below in the visit note. 

## 2014-08-21 NOTE — Assessment & Plan Note (Signed)
Seems to be well-controlled, continue Diovan, amlodipine, atenolol, check a BMP

## 2014-08-21 NOTE — Assessment & Plan Note (Signed)
Occasional symptoms, patient states he will not like to take a prescription daily. Discussed when necessary medication, will call when ready

## 2014-08-21 NOTE — Assessment & Plan Note (Signed)
Has an abdominal hernia, considering surgery, request a prescription for an abdominal binder

## 2014-08-21 NOTE — Assessment & Plan Note (Signed)
Checking an A1c today

## 2014-08-21 NOTE — Progress Notes (Signed)
Subjective:    Patient ID: [name], male    DOB: [birth year], 82 y.o.   MRN: [id number]  DOS:  08/21/2014 Type of visit - description :  Here for Medicare AWV:  1. Risk factors based on Past M, S, F history: reviewed 2. Physical Activities:  Not very active, R foot pain DJD 3. Depression/mood: neg screening  4. Hearing:  Has hearing aids   5. ADL's: independent, drives  6. Fall Risk: no recent falls, prevention discussed , see AVS 7. home Safety: does feel safe at home  8. Height, weight, & visual acuity: see VS, sees eye doctor regularly, good vision 9. Counseling: provided 10. Labs ordered based on risk factors: if needed  11. Referral Coordination: if needed 12. Care Plan, see assessment and plan , written personalized plan provided , see AVS 13. Cognitive Assessment: motor skills and cognition appropriate for age 75. Care team updated  15. End-of-life care discussed, does have a living will  In addition, today we discussed the following: Anxiety-- some sx , sporadic, not ready for daily medicines  Hypertension: Good compliance of medication, ambulatory BPs at home 120/60 High cholesterol: On simvastatin 10 mg, doing well, no apparent side effects. Saw surgery a few months ago, recommended surgery for incisional ventral hernia, unsure if he should proceed.  Review of Systems Constitutional: No fever. No chills. No unexplained wt changes. No unusual sweats  HEENT: No dental problems, no ear discharge, no facial swelling, no voice changes. No eye discharge, no eye  redness , no  intolerance to light   Respiratory: No wheezing , no  difficulty breathing. Occasional dry cough, he thinks due to allergies   , no mucus production  Cardiovascular: No CP, no leg swelling , no  Palpitations  GI: no nausea, no vomiting, no diarrhea , no  abdominal pain.  No blood in the stools. No dysphagia, no odynophagia    Endocrine: No polyphagia, no polyuria , no polydipsia  GU: No  dysuria, gross hematuria, difficulty urinating. No urinary urgency, no frequency.  Musculoskeletal: No joint swellings or unusual aches or pains  Skin: No change in the color of the skin, palor , no  Rash  Allergic, immunologic: No environmental allergies , no  food allergies. Occasional sneezing  Neurological: No dizziness no  syncope. No headaches. No diplopia, no slurred, no slurred speech, no motor deficits, no facial  Numbness  Hematological: No enlarged lymph nodes, no easy bruising , no unusual bleedings  Psychiatry: No suicidal ideas, no hallucinations, no beavior problems, no confusion.  No unusual/severe  depression   Past Medical History  Diagnosis Date  . Allergic rhinitis   . GERD (gastroesophageal reflux disease)     w/ esoph stricture  . Diabetes mellitus   . Hyperlipidemia   . Hypertension   . BPH (benign prostatic hypertrophy)   . Osteoarthritis   . Stroke     CVA, per CT "old stroke"  . Internal hemorrhoids   . Anemia   . History of colonic polyps   . Polyneuropathy     NCS by neurology per pt 5/07  . Anxiety   . Hiatal hernia   . Inguinal hernia, left     Past Surgical History  Procedure Laterality Date  . Total knee arthroplasty  05/2006    (R)  . Total knee arthroplasty  08/2006    (L)  . Hernia repair      bil inguinal- per pt L in 90s, R in  the 80s  . Cataract extraction Bilateral 01/2009  . Cardiovascular stress test  02-2010    done in [place], had CP, +EKG changes, neg nuclear  imagins  . Ercp w/ sphicterotomy      ????? Dr Deatra Ina  . Cholecystectomy N/A 08/16/2013    Procedure: LAPAROSCOPIC CONVERTED TO OPEN  CHOLECYSTECTOMY WITH INTRAOPERATIVE CHOLANGIOGRAM;  Surgeon: Imogene Burn. Georgette Dover, MD;  Location: [facility];  Service: General;  Laterality: N/A;    Social History   Social History  . Marital Status: Married    Spouse Name: N/A  . Number of Children: 2  . Years of Education: N/A   Occupational History  . retired    Social History Main  Topics  . Smoking status: Former Research scientist (life sciences)  . Smokeless tobacco: Former Systems developer    Quit date: 01/05/1961  . Alcohol Use: No  . Drug Use: No  . Sexual Activity: Not on file   Other Topics Concern  . Not on file   Social History Narrative   Retired, Married, original from [place], lives w/ wife     has two children, lost a son , has a daughter           Family History  Problem Relation Age of Onset  . Diabetes Brother   . Heart attack Brother 39  . Colon cancer Neg Hx   . Prostate cancer Neg Hx   . Liver cancer Brother        Medication List       This list is accurate as of: 08/21/14 12:24 PM.  Always use your most recent med list.               AMBULATORY NON FORMULARY MEDICATION  Medication Name: Abdominal Binder Dx: Ventral Hernia (K43.9)     amLODipine 5 MG tablet  Commonly known as:  NORVASC  Take 1 tablet (5 mg total) by mouth daily.     aspirin EC 81 MG tablet  Take 81 mg by mouth daily.     atenolol 50 MG tablet  Commonly known as:  TENORMIN  Take 0.5 tablets (25 mg total) by mouth daily.     finasteride 5 MG tablet  Commonly known as:  PROSCAR  Take 1 tablet (5 mg total) by mouth daily.     omeprazole 20 MG capsule  Commonly known as:  PRILOSEC  Take 1 capsule (20 mg total) by mouth 2 (two) times daily before a meal.     simvastatin 10 MG tablet  Commonly known as:  ZOCOR  Take 1 tablet (10 mg total) by mouth at bedtime.     valsartan 160 MG tablet  Commonly known as:  DIOVAN  Take 1 tablet (160 mg total) by mouth daily.           Objective:   Physical Exam BP 152/74 mmHg  Pulse 56  Temp(Src) 98.2 F (36.8 C) (Oral)  Ht 5\' 5"  (1.651 m)  Wt 135 lb 4 oz (61.349 kg)  BMI 22.51 kg/m2  SpO2 96% General:   Well developed, well nourished . NAD.  HEENT:  Normocephalic . Face symmetric, atraumatic  neck no thyromegaly. Lungs:  CTA B Normal respiratory effort, no intercostal retractions, no accessory muscle use. Heart: RRR,  no murmur.    no pretibial edema bilaterally  Abdomen:  Not distended, soft, non-tender. No rebound or rigidity. + Hernia at the right upper quadrant, reducible, not tender  Skin: Not pale. Not jaundice Neurologic:  alert & oriented X3.  Speech normal, gait appropriate for age and unassisted Psych--  Cognition and judgment appear intact.  Cooperative with normal attention span and concentration.  Behavior appropriate. No anxious or depressed appearing.    Assessment & Plan:

## 2014-08-21 NOTE — Assessment & Plan Note (Addendum)
Td 3-12 ; Pneumovax 2006 ; prevnar 2015; shingles shot Rx was  Provided before but never used  Recommend a flu shot this fall  Colon cancer screening:  Colonoscopy 08/20/2006 : adenomatous polyp, Dr Deatra Ina, per his note 05-2012, no further colonoscopy planned  bone Density: 10/21/2005 and  11-09, both normal- neg ; rec ca and vit D  Prostate cancer screening: per urology Counseling: diet discussed , encouraged to saty active

## 2014-08-21 NOTE — Assessment & Plan Note (Signed)
Continue with simvastatin 10 mg daily, send a prescription, check AST, ALT. Last FLP satisfactory

## 2014-08-22 ENCOUNTER — Telehealth: Payer: Self-pay | Admitting: Internal Medicine

## 2014-08-22 MED ORDER — FERROUS SULFATE 325 (65 FE) MG PO TABS: 325.0000 mg | ORAL_TABLET | Freq: Two times a day (BID) | ORAL | Status: DC

## 2014-08-22 NOTE — Addendum Note (Signed)
Addended by: Wilfrid Lund on: 08/22/2014 03:17 PM   Modules accepted: Orders

## 2014-08-22 NOTE — Telephone Encounter (Signed)
°  Relation to XT:AVWP Call back [place]:  Reason for call: pt would like a copy of his most recent lab results mailed to him

## 2014-08-22 NOTE — Telephone Encounter (Signed)
Lab results from 08/21/2014 printed and mailed to Pt. Made notes regarding Hgb (informed Pt that starting Ferrous Sulfate and multivitamin daily should help). And blood glucose slightly elevated, instructed Pt to watch diet and increase exercise. Instructed Pt to call office and schedule appt for 4 months.

## 2014-08-22 NOTE — Telephone Encounter (Signed)
Pt returning your call. Would like call back today. Best # [phone].

## 2014-08-22 NOTE — Telephone Encounter (Signed)
Spoke with Pt, informed Pt of lab results. See result notes for further details.

## 2014-08-29 DIAGNOSIS — H10413 Chronic giant papillary conjunctivitis, bilateral: Secondary | ICD-10-CM | POA: Diagnosis not present

## 2014-08-29 DIAGNOSIS — H04123 Dry eye syndrome of bilateral lacrimal glands: Secondary | ICD-10-CM | POA: Diagnosis not present

## 2014-08-29 DIAGNOSIS — Z961 Presence of intraocular lens: Secondary | ICD-10-CM | POA: Diagnosis not present

## 2014-08-30 ENCOUNTER — Telehealth: Payer: Self-pay | Admitting: Internal Medicine

## 2014-08-30 NOTE — Telephone Encounter (Signed)
We have not referred to anyone since November 07, 2013. If they need medical records, they will have to have the Pt sign a release of information and fax it to [place].

## 2014-08-30 NOTE — Telephone Encounter (Signed)
Caller name:Maria  Relationship to patient: [facility]  Can be reached: [phone] (phone)     [phone] (fax) Pharmacy:  Reason for call: pt was referred to [facility] they are calling because they need his medical records regarding his referral.

## 2014-09-03 DIAGNOSIS — K823 Fistula of gallbladder: Secondary | ICD-10-CM | POA: Diagnosis not present

## 2014-09-03 DIAGNOSIS — K432 Incisional hernia without obstruction or gangrene: Secondary | ICD-10-CM | POA: Diagnosis not present

## 2014-09-04 DIAGNOSIS — R972 Elevated prostate specific antigen [PSA]: Secondary | ICD-10-CM | POA: Diagnosis not present

## 2014-09-06 DIAGNOSIS — R972 Elevated prostate specific antigen [PSA]: Secondary | ICD-10-CM | POA: Diagnosis not present

## 2014-09-06 DIAGNOSIS — R3912 Poor urinary stream: Secondary | ICD-10-CM | POA: Diagnosis not present

## 2014-09-06 DIAGNOSIS — N401 Enlarged prostate with lower urinary tract symptoms: Secondary | ICD-10-CM | POA: Diagnosis not present

## 2014-09-06 DIAGNOSIS — N138 Other obstructive and reflux uropathy: Secondary | ICD-10-CM | POA: Diagnosis not present

## 2014-09-21 ENCOUNTER — Ambulatory Visit: Payer: Medicare Other

## 2014-09-21 DIAGNOSIS — Z23 Encounter for immunization: Secondary | ICD-10-CM

## 2014-09-21 MED ORDER — INFLUENZA VAC SPLIT HIGH-DOSE 0.5 ML IM SUSY: 0.5000 mL | PREFILLED_SYRINGE | Freq: Once | INTRAMUSCULAR | Status: DC

## 2014-09-21 MED ORDER — INFLUENZA VAC SPLIT QUAD 0.5 ML IM SUSY
0.5000 mL | PREFILLED_SYRINGE | Freq: Once | INTRAMUSCULAR | Status: AC
  Administered 2014-09-21 (×2): 0.5 mL via INTRAMUSCULAR

## 2014-10-08 ENCOUNTER — Telehealth: Payer: Self-pay | Admitting: Internal Medicine

## 2014-10-09 MED ORDER — AMLODIPINE BESYLATE 5 MG PO TABS: 5.0000 mg | ORAL_TABLET | Freq: Every day | ORAL | Status: DC

## 2014-10-09 MED ORDER — VALSARTAN 160 MG PO TABS: 160.0000 mg | ORAL_TABLET | Freq: Every day | ORAL | Status: DC

## 2014-10-09 MED ORDER — ATENOLOL 50 MG PO TABS: 25.0000 mg | ORAL_TABLET | Freq: Every day | ORAL | Status: DC

## 2014-10-09 NOTE — Telephone Encounter (Signed)
All 3 medications were received by [facility] on 08/21/2014 at 0953. However, will resend prescriptions.

## 2014-10-09 NOTE — Telephone Encounter (Signed)
amLODipine (NORVASC) 5 MG tablet 90 tablet 1 08/21/2014      Sig - Route: Take 1 tablet (5 mg total) by mouth daily. - Oral     Notes to Pharmacy: D/C PREVIOUS SCRIPTS FOR THIS MEDICATION     E-Prescribing Status: Receipt confirmed by pharmacy (08/21/2014 9:53 AM EDT)

## 2014-10-09 NOTE — Telephone Encounter (Signed)
Please fill for finasteride, amlodipine, and valsartan.

## 2014-10-09 NOTE — Telephone Encounter (Signed)
Medication Detail       Disp Refills Start End      finasteride (PROSCAR) 5 MG tablet 90 tablet 1 08/21/2014      Sig - Route: Take 1 tablet (5 mg total) by mouth daily. - Oral     Notes to Pharmacy: D/C PREVIOUS SCRIPTS FOR THIS MEDICATION     E-Prescribing Status: Receipt confirmed by pharmacy (08/21/2014 9:53 AM EDT)

## 2014-10-09 NOTE — Addendum Note (Signed)
Addended by: Wilfrid Lund on: 10/09/2014 01:55 PM   Modules accepted: Orders

## 2014-10-09 NOTE — Telephone Encounter (Signed)
valsartan (DIOVAN) 160 MG tablet 90 tablet 1 08/21/2014       Sig - Route: Take 1 tablet (160 mg total) by mouth daily. - Oral     Notes to Pharmacy: D/C PREVIOUS SCRIPTS FOR THIS MEDICATION     E-Prescribing Status: Receipt confirmed by pharmacy (08/21/2014 9:53 AM EDT)

## 2014-10-09 NOTE — Telephone Encounter (Signed)
Rx resent to [facility]. °

## 2014-10-09 NOTE — Telephone Encounter (Signed)
Pt called [facility] and they stated they never recd the RXs 08/21/14 and do not have RX on file for pt. Please resend RXs for

## 2014-10-15 ENCOUNTER — Other Ambulatory Visit: Payer: Self-pay | Admitting: Internal Medicine

## 2014-10-16 ENCOUNTER — Telehealth: Payer: Self-pay | Admitting: Internal Medicine

## 2014-10-16 MED ORDER — FINASTERIDE 5 MG PO TABS
5.0000 mg | ORAL_TABLET | Freq: Every day | ORAL | Status: DC
Start: 2014-10-16 — End: 2015-02-08

## 2014-10-16 NOTE — Telephone Encounter (Signed)
Dose: 5 mg Route: Oral Frequency: Daily   Order Comments:   D/C PREVIOUS SCRIPTS FOR THIS MEDICATION        Dispense Quantity:  90 tablet Refills:  1 Fills Remaining:  1          Sig: Take 1 tablet (5 mg total) by mouth daily.         Written Date:  08/21/14 Expiration Date:  08/21/15     Start Date:  08/21/14 End Date:  --     Ordering Provider:  Colon Branch, MD Authorizing Provider:  Colon Branch, MD Ordering User:  Wilfrid Lund, CMA                     Original Order:  finasteride (PROSCAR) 5 MG tablet [[id number]]        Pharmacy:  [facility] [place] Comments:  --         Quantity Remaining:  90 tablet Quantity Filled:  0 tablet

## 2014-10-16 NOTE — Telephone Encounter (Signed)
Caller name: Dartanian Knaggs   Relationship to patient: Self   Can be reached: [phone]  Pharmacy: [facility], [place]  Reason for call: Pt is requesting a refill on Finasteride. Pt says that he is out of this medication.

## 2014-10-16 NOTE — Telephone Encounter (Signed)
Called pt to inform of the below message. He says that he spoke with [facility] and they advised him that he doesn't have any refills. Pt requested a call back from you directly.

## 2014-10-16 NOTE — Telephone Encounter (Signed)
Rx resent to [facility]. °

## 2014-10-16 NOTE — Telephone Encounter (Signed)
Finasteride was refilled on 08/21/2014 #90 tablets and 1 refill to [facility]. He will need to call [facility] for refills.

## 2014-10-17 ENCOUNTER — Telehealth: Payer: Self-pay

## 2014-10-17 NOTE — Telephone Encounter (Signed)
The prescription was sent to [facility] yesterday again. It may take them 2-3 weeks to ship to him. If he needs a supply, I can send to a local pharmacy.

## 2014-10-17 NOTE — Telephone Encounter (Signed)
Patient request to speak with nurse or provider please call the patient directly

## 2014-10-17 NOTE — Telephone Encounter (Signed)
Patient needs to be informed on the status of his medication, He is out of medication and confused about why he has not heard from the Dr. Please call patient

## 2014-10-17 NOTE — Telephone Encounter (Signed)
LMOM informing Pt that original Finasteride prescription was sent for #90 tablets and 1 refill to [facility] on 08/21/2014, unsure why they did not receive the prescription. I informed him I received his message yesterday and resent the prescription for #90 tablets and 1 refill to [facility] on 10/16/2014. Informed him that I have no control over how long it will take for him to receive the medication from [facility]. Informed him I can send a 15-30 day supply to a local pharmacy so that way he will have medication while waiting on the shipment. Informed him to call and let me know of his decision and apologized for any inconvenience.

## 2014-10-17 NOTE — Telephone Encounter (Signed)
error 

## 2014-11-06 LAB — HM DIABETES EYE EXAM

## 2014-12-06 LAB — PSA: PSA: 11.77

## 2014-12-07 DIAGNOSIS — R972 Elevated prostate specific antigen [PSA]: Secondary | ICD-10-CM | POA: Diagnosis not present

## 2014-12-11 ENCOUNTER — Encounter: Payer: Self-pay | Admitting: Podiatry

## 2014-12-11 ENCOUNTER — Ambulatory Visit (INDEPENDENT_AMBULATORY_CARE_PROVIDER_SITE_OTHER): Payer: Medicare Other | Admitting: Podiatry

## 2014-12-11 ENCOUNTER — Telehealth: Payer: Self-pay | Admitting: Internal Medicine

## 2014-12-11 VITALS — BP 148/72 | HR 62 | Resp 16

## 2014-12-11 DIAGNOSIS — Q828 Other specified congenital malformations of skin: Secondary | ICD-10-CM

## 2014-12-11 DIAGNOSIS — B351 Tinea unguium: Secondary | ICD-10-CM

## 2014-12-11 DIAGNOSIS — E1142 Type 2 diabetes mellitus with diabetic polyneuropathy: Secondary | ICD-10-CM

## 2014-12-11 DIAGNOSIS — M79676 Pain in unspecified toe(s): Secondary | ICD-10-CM | POA: Diagnosis not present

## 2014-12-11 NOTE — Telephone Encounter (Signed)
Sure

## 2014-12-11 NOTE — Progress Notes (Signed)
He presents today with a chief complaint of painful elongated toenails as well as porokeratosis and calluses to the distal aspect of the second digit right lateral aspect of the fifth digit left and the medial aspect of the first metatarsophalangeal joint right foot.  Objective: Vital signs are stable he is alert and oriented 3. Pulses are strongly palpable. Neurologic sensorium is intact. Hammertoe deformities present. Hallux valgus deformities present. His toenails are thick yellow dystrophic onychomycotic and painful on palpation. Distal clavus second digit of the right foot fifth digit of the left foot and medial aspect of the MTPJ is a porokeratotic lesion.  Assessment: Pain limb secondary to porokeratosis and onychomycosis.  Plan: Debrided all reactive hyperkeratosis and toenails bilaterally. Follow-up with me in 3 months.

## 2014-12-12 DIAGNOSIS — N401 Enlarged prostate with lower urinary tract symptoms: Secondary | ICD-10-CM | POA: Diagnosis not present

## 2014-12-12 DIAGNOSIS — R972 Elevated prostate specific antigen [PSA]: Secondary | ICD-10-CM | POA: Diagnosis not present

## 2014-12-12 DIAGNOSIS — N138 Other obstructive and reflux uropathy: Secondary | ICD-10-CM | POA: Diagnosis not present

## 2014-12-17 ENCOUNTER — Encounter: Payer: Self-pay | Admitting: Internal Medicine

## 2014-12-18 ENCOUNTER — Encounter: Payer: Self-pay | Admitting: Internal Medicine

## 2014-12-18 ENCOUNTER — Ambulatory Visit (INDEPENDENT_AMBULATORY_CARE_PROVIDER_SITE_OTHER): Payer: Medicare Other | Admitting: Internal Medicine

## 2014-12-18 VITALS — BP 126/58 | HR 63 | Temp 97.8°F | Ht 65.0 in | Wt 131.0 lb

## 2014-12-18 DIAGNOSIS — R7989 Other specified abnormal findings of blood chemistry: Secondary | ICD-10-CM | POA: Diagnosis not present

## 2014-12-18 DIAGNOSIS — I10 Essential (primary) hypertension: Secondary | ICD-10-CM | POA: Diagnosis not present

## 2014-12-18 DIAGNOSIS — E785 Hyperlipidemia, unspecified: Secondary | ICD-10-CM

## 2014-12-18 DIAGNOSIS — Z09 Encounter for follow-up examination after completed treatment for conditions other than malignant neoplasm: Secondary | ICD-10-CM | POA: Insufficient documentation

## 2014-12-18 DIAGNOSIS — D649 Anemia, unspecified: Secondary | ICD-10-CM

## 2014-12-18 DIAGNOSIS — Z7184 Encounter for health counseling related to travel: Secondary | ICD-10-CM

## 2014-12-18 LAB — LIPID PANEL
Cholesterol: 109 mg/dL (ref 0–200)
HDL: 40.7 mg/dL (ref >39.00)
LDL Cholesterol: 38 mg/dL (ref 0–99)
NonHDL: 68.12
Total CHOL/HDL Ratio: 3
Triglycerides: 153 mg/dL — ABNORMAL HIGH (ref 0.0–149.0)
VLDL: 30.6 mg/dL (ref 0.0–40.0)

## 2014-12-18 LAB — BASIC METABOLIC PANEL
BUN: 11 mg/dL (ref 6–23)
CO2: 28 mEq/L (ref 19–32)
Calcium: 9.3 mg/dL (ref 8.4–10.5)
Chloride: 100 mEq/L (ref 96–112)
Creatinine, Ser: 0.77 mg/dL (ref 0.40–1.50)
GFR: 101.7 mL/min (ref >60.00)
Glucose, Bld: 111 mg/dL — ABNORMAL HIGH (ref 70–99)
Potassium: 4 mEq/L (ref 3.5–5.1)
Sodium: 137 mEq/L (ref 135–145)

## 2014-12-18 LAB — TSH: TSH: 4.28 u[IU]/mL (ref 0.35–4.50)

## 2014-12-18 LAB — IRON: Iron: 81 ug/dL (ref 42–165)

## 2014-12-18 LAB — FOLATE: Folate: >23.8 ng/mL (ref >5.9)

## 2014-12-18 LAB — FERRITIN: Ferritin: 28.4 ng/mL (ref 22.0–322.0)

## 2014-12-18 LAB — VITAMIN B12: Vitamin B-12: 301 pg/mL (ref 211–911)

## 2014-12-18 MED ORDER — ALPRAZOLAM 0.5 MG PO TABS: 0.2500 mg | ORAL_TABLET | Freq: Three times a day (TID) | ORAL | Status: DC | PRN

## 2014-12-18 NOTE — Progress Notes (Signed)
Subjective:    Patient ID: [name], male    DOB: [birth year], 81 y.o.   MRN: [id number]  DOS:  12/18/2014 Type of visit - description : Routine visit Interval history: Recently found to have anemia, took iron for 2 months, no side effects, he self discontinue meds. Continue with anxiety, request a medication. Going to [place], travel  advise? HTN: Well controlled with current medications, pt request a BMP Recently labs showed elevated TSH, due for a checkup   Review of Systems Admits to cough and sneezing frequently, on OTC antihistaminics, nasal spray? Denies nausea, vomiting, diarrhea. No blood in the stools. No acid reflux, dysphagia or odynophagia. Denies depression per se  Past Medical History  Diagnosis Date  . Allergic rhinitis   . GERD (gastroesophageal reflux disease)     w/ esoph stricture  . Diabetes mellitus   . Hyperlipidemia   . Hypertension   . BPH (benign prostatic hypertrophy)     with weak urinary stream  . Osteoarthritis   . Stroke [facility])     CVA, per CT "old stroke"  . Internal hemorrhoids   . Anemia   . History of colonic polyps   . Polyneuropathy ([place])     NCS by neurology per pt 5/07  . Anxiety   . Hiatal hernia   . Inguinal hernia 2016    Dr. Georgette Dover, Pt does not want to proceed with repair at this time  . Cholecystoduodenal fistula 2016    Dr. Georgette Dover  . Elevated PSA     Past Surgical History  Procedure Laterality Date  . Total knee arthroplasty  05/2006    (R)  . Total knee arthroplasty  08/2006    (L)  . Hernia repair      bil inguinal- per pt L in 90s, R in the 80s  . Cataract extraction Bilateral 01/2009  . Cardiovascular stress test  02-2010    done in [place], had CP, +EKG changes, neg nuclear  imagins  . Ercp w/ sphicterotomy      ????? Dr Deatra Ina  . Cholecystectomy N/A 08/16/2013    Procedure: LAPAROSCOPIC CONVERTED TO OPEN  CHOLECYSTECTOMY WITH INTRAOPERATIVE CHOLANGIOGRAM;  Surgeon: Imogene Burn. Georgette Dover, MD;  Location: [facility];   Service: General;  Laterality: N/A;    Social History   Social History  . Marital Status: Married    Spouse Name: N/A  . Number of Children: 2  . Years of Education: N/A   Occupational History  . retired    Social History Main Topics  . Smoking status: Former Research scientist (life sciences)  . Smokeless tobacco: Former Systems developer    Quit date: 01/05/1961  . Alcohol Use: No  . Drug Use: No  . Sexual Activity: Not on file   Other Topics Concern  . Not on file   Social History Narrative   Retired, Married, original from [place], lives w/ wife     has two children, lost a son , has a daughter              Medication List       This list is accurate as of: 12/18/14  1:10 PM.  Always use your most recent med list.               ALPRAZolam 0.5 MG tablet  Commonly known as:  XANAX  Take 0.5-1 tablets (0.25-0.5 mg total) by mouth 3 (three) times daily as needed for anxiety.     AMBULATORY NON FORMULARY MEDICATION  Medication  Name: Abdominal Binder Dx: Ventral Hernia (K43.9)     amLODipine 5 MG tablet  Commonly known as:  NORVASC  Take 1 tablet (5 mg total) by mouth daily.     aspirin EC 81 MG tablet  Take 81 mg by mouth daily.     atenolol 50 MG tablet  Commonly known as:  TENORMIN  Take 0.5 tablets (25 mg total) by mouth daily.     finasteride 5 MG tablet  Commonly known as:  PROSCAR  Take 1 tablet (5 mg total) by mouth daily.     omeprazole 20 MG capsule  Commonly known as:  PRILOSEC  Take 1 capsule (20 mg total) by mouth 2 (two) times daily before a meal.     simvastatin 10 MG tablet  Commonly known as:  ZOCOR  Take 1 tablet (10 mg total) by mouth at bedtime.     valsartan 160 MG tablet  Commonly known as:  DIOVAN  Take 1 tablet (160 mg total) by mouth daily.           Objective:   Physical Exam BP 126/58 mmHg  Pulse 63  Temp(Src) 97.8 F (36.6 C) (Oral)  Ht 5\' 5"  (1.651 m)  Wt 131 lb (59.421 kg)  BMI 21.80 kg/m2  SpO2 98% General:   Well developed, well nourished  . NAD.  HEENT:  Normocephalic . Face symmetric, atraumatic Lungs:  CTA B Normal respiratory effort, no intercostal retractions, no accessory muscle use. Heart: RRR,  no murmur.  No pretibial edema bilaterally  Diabetic feet exam: Normal pedal pulses, normal pinprick examination Neurologic:  alert & oriented X3.  Speech normal, gait appropriate for age and unassisted Psych--  Cognition and judgment appear intact.  Cooperative with normal attention span and concentration.  Behavior appropriate. No anxious or depressed appearing.      Assessment & Plan:   Assessment > DM Polyneuropathy by NCS, saw neurology 2007, asx  HTN Hyperlipidemia Anxiety DJD: See surgeries GERD with esophageal stricture, HH H/o anemia  BPH, increase PSA H/o Stroke ("old stroke" per CT) H/o  hernia, 2016, declined repair  PLAN prediabetes: Normal foot exam. Saw the eye doctor a month ago. HTN: Patient request a BMP Hyperlipidemia: on  statins, check FLP Anxiety: ongoing issue, request prn meds  RX Xanax, warned about side effects such as drowsiness. Anemia: Recently found to have mild anemia, iron deficiency. Hg has nor rebound since cholecystectomy last year. He is up-to-date on screening colonoscopy. He just took iron for 2 months, he is a vegetarian. Check labs. Going to [place]: needs to be seen at the  travel clinic Abnormal TSH: Labs Allergies: Trial with Flonase RTC 4 months RTC 4 months

## 2014-12-18 NOTE — Patient Instructions (Addendum)
  Get your blood work before you leave   Use Flonase 2 sprays in each side of the nose consistently  Call te infectious diseases clinic for travel advice before you go to [place]. Call ASAP Address: [address], [place], [place] [address]  Phone: [phone]   Xanax as needed, will cause drowsiness. Be careful.   Next visit  for a  routine checkup in 4 months, no fasting  (30 minutes) Please schedule an appointment at the front desk

## 2014-12-18 NOTE — Progress Notes (Signed)
Pre visit review using our clinic review tool, if applicable. No additional management support is needed unless otherwise documented below in the visit note. 

## 2014-12-18 NOTE — Assessment & Plan Note (Signed)
prediabetes: Normal foot exam. Saw the eye doctor a month ago. HTN: Patient request a BMP Hyperlipidemia: on  statins, check FLP Anxiety: ongoing issue, request prn meds  RX Xanax, warned about side effects such as drowsiness. Anemia: Recently found to have mild anemia, iron deficiency. Hg has nor rebound since cholecystectomy last year. He is up-to-date on screening colonoscopy. He just took iron for 2 months, he is a vegetarian. Check labs. Going to [place]: needs to be seen at the  travel clinic Abnormal TSH: Labs Allergies: Trial with Flonase RTC 4 months

## 2014-12-20 ENCOUNTER — Encounter: Payer: Self-pay | Admitting: Internal Medicine

## 2015-01-02 ENCOUNTER — Ambulatory Visit: Payer: Medicare Other | Admitting: Internal Medicine

## 2015-01-16 DIAGNOSIS — R972 Elevated prostate specific antigen [PSA]: Secondary | ICD-10-CM | POA: Diagnosis not present

## 2015-01-17 LAB — PSA: PSA: 9.65

## 2015-01-28 ENCOUNTER — Encounter: Payer: Self-pay | Admitting: Internal Medicine

## 2015-01-28 ENCOUNTER — Ambulatory Visit (INDEPENDENT_AMBULATORY_CARE_PROVIDER_SITE_OTHER): Payer: Medicare Other | Admitting: Internal Medicine

## 2015-01-28 VITALS — BP 146/72 | HR 80 | Ht 63.5 in | Wt 130.0 lb

## 2015-01-28 DIAGNOSIS — K432 Incisional hernia without obstruction or gangrene: Secondary | ICD-10-CM | POA: Diagnosis not present

## 2015-01-28 DIAGNOSIS — Z86010 Personal history of colonic polyps: Secondary | ICD-10-CM | POA: Diagnosis not present

## 2015-01-28 DIAGNOSIS — K219 Gastro-esophageal reflux disease without esophagitis: Secondary | ICD-10-CM

## 2015-01-28 NOTE — Patient Instructions (Signed)
Please follow up with Dr. Perry as needed 

## 2015-01-28 NOTE — Progress Notes (Signed)
HISTORY OF PRESENT ILLNESS:  [name] is a 82 y.o. male with a history of GERD who presents today for continuity GI care. Previous patient of Dr. Deatra [name]. Patient reports long-standing history of GERD for which she takes omeprazole 40 mg daily. On medication no reflux symptoms. He has a history of symptomatic choledocholithiasis for which she underwent ERCP with sphincterotomy and stone extraction in 2010. In 2015 he presented with symptomatic gallbladder disease and underwent cholecystectomy. 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 Surgery was open and complicated by incisional hernia. He went to [facility] for second opinion regarding the hernia. No plans for surgery. He states he does quite well with an abdominal corset. He is accompanied today by his wife. He denies dysphagia or breakthrough symptoms. No abdominal pain or other issues. Bowel habits are regular. He has had colonoscopies previously. Last examination 2008 with a diminutive adenoma. He has aged out of surveillance.  REVIEW OF SYSTEMS:  All non-GI ROS negative except for anxiety, arthritis  Past Medical History  Diagnosis Date  . Allergic rhinitis   . GERD (gastroesophageal reflux disease)     w/ esoph stricture  . Diabetes mellitus   . Hyperlipidemia   . Hypertension   . BPH (benign prostatic hypertrophy)     with weak urinary stream  . Osteoarthritis   . Stroke [facility])     CVA, per CT "old stroke"  . Internal hemorrhoids   . Anemia   . History of colonic polyps   . Polyneuropathy ([place])     NCS by neurology per pt 5/07  . Anxiety   . Hiatal hernia   . Inguinal hernia 2016    Dr. Georgette [name], Pt does not want to proceed with repair at this time  . Cholecystoduodenal fistula 2016    Dr. Georgette [name]  . Elevated PSA   . Common bile duct stone   . Esophageal stricture     Past Surgical History  Procedure Laterality Date  . Total knee arthroplasty  05/2006    (R)  . Total knee  arthroplasty  08/2006    (L)  . Hernia repair      bil inguinal- per pt L in 90s, R in the 80s  . Cataract extraction Bilateral 01/2009  . Cardiovascular stress test  02-2010    done in [place], had CP, +EKG changes, neg nuclear  imagins  . Ercp w/ sphicterotomy      ????? Dr [name]  . Cholecystectomy N/A 08/16/2013    Procedure: LAPAROSCOPIC CONVERTED TO OPEN  CHOLECYSTECTOMY WITH INTRAOPERATIVE CHOLANGIOGRAM;  Surgeon: [name]. [name] Dover, MD;  Location: [place];  Service: General;  Laterality: N/A;    Social History [name]  reports that he has quit smoking. He quit smokeless tobacco use about 54 years ago. He reports that he does not drink alcohol or use illicit drugs.  family history includes Diabetes in his brother; Heart attack (age of onset: 7) in his brother; Liver cancer in his brother. There is no history of Colon cancer or Prostate cancer.  No Known Allergies     PHYSICAL EXAMINATION: Vital signs: BP 146/72 mmHg  Pulse 80  Ht 5' 3.5" (1.613 m)  Wt 130 lb (58.968 kg)  BMI 22.66 kg/m2  Constitutional: generally well-appearing, no acute distress Psychiatric: alert and oriented x3, cooperative Eyes: extraocular movements intact, anicteric, conjunctiva pink Mouth: oral pharynx moist, no lesions Neck: supple no lymphadenopathy Cardiovascular: heart regular rate and rhythm, no murmur  Lungs: clear to auscultation bilaterally Abdomen: soft, nontender, nondistended, no obvious ascites, no peritoneal signs, normal bowel sounds, no organomegaly. Large incisional right upper quadrant with nontender spontaneous reducing ventral hernia Rectal: Omitted Extremities: no clubbing cyanosis or lower extremity edema bilaterally Skin: no lesions on visible extremities Neuro: No focal deficits. Normal deep tendon reflexes  ASSESSMENT:  #1. Chronic GERD. Stable on PPI #2. History of complicated gallbladder surgery 2015. Recovered #3. Incisional hernia. Non-problematic, particularly when he  wears abdominal binder. Has had surgical opinion 2 #4. History of diminutive adenoma 2008. Aged out of surveillance   PLAN:  #1. Reflux precautions #2. Continue PPI. Lowest dose to control symptoms #3. Continue to observe ventral hernia. Should he develop significant symptoms or pain, repeat surgical evaluation #4. Ongoing general medical care with Dr. Larose [name]. GI follow-up as needed  20 minutes was spent face-to-face with the patient. Greater than 50% of the time use for counseling regarding chronic GERD and his incisional hernia

## 2015-02-08 ENCOUNTER — Telehealth: Payer: Self-pay | Admitting: Internal Medicine

## 2015-04-09 ENCOUNTER — Observation Stay (HOSPITAL_COMMUNITY)
Admission: EM | Admit: 2015-04-09 | Discharge: 2015-04-12 | Disposition: A | Discharge status: Home / Self Care | Payer: Medicare Other | Attending: Internal Medicine | Admitting: Internal Medicine

## 2015-04-09 ENCOUNTER — Encounter (HOSPITAL_COMMUNITY): Payer: Self-pay | Admitting: Emergency Medicine

## 2015-04-09 DIAGNOSIS — N410 Acute prostatitis: Secondary | ICD-10-CM | POA: Diagnosis not present

## 2015-04-09 DIAGNOSIS — K439 Ventral hernia without obstruction or gangrene: Secondary | ICD-10-CM | POA: Insufficient documentation

## 2015-04-09 DIAGNOSIS — E785 Hyperlipidemia, unspecified: Secondary | ICD-10-CM | POA: Diagnosis not present

## 2015-04-09 DIAGNOSIS — F419 Anxiety disorder, unspecified: Secondary | ICD-10-CM | POA: Diagnosis not present

## 2015-04-09 DIAGNOSIS — Z87891 Personal history of nicotine dependence: Secondary | ICD-10-CM | POA: Insufficient documentation

## 2015-04-09 DIAGNOSIS — Z86010 Personal history of colonic polyps: Secondary | ICD-10-CM | POA: Insufficient documentation

## 2015-04-09 DIAGNOSIS — Z880 Allergy status to penicillin: Secondary | ICD-10-CM | POA: Diagnosis not present

## 2015-04-09 DIAGNOSIS — I10 Essential (primary) hypertension: Secondary | ICD-10-CM | POA: Diagnosis not present

## 2015-04-09 DIAGNOSIS — D696 Thrombocytopenia, unspecified: Secondary | ICD-10-CM | POA: Insufficient documentation

## 2015-04-09 DIAGNOSIS — K219 Gastro-esophageal reflux disease without esophagitis: Secondary | ICD-10-CM | POA: Diagnosis not present

## 2015-04-09 DIAGNOSIS — R6510 Systemic inflammatory response syndrome (SIRS) of non-infectious origin without acute organ dysfunction: Secondary | ICD-10-CM | POA: Insufficient documentation

## 2015-04-09 DIAGNOSIS — R319 Hematuria, unspecified: Secondary | ICD-10-CM | POA: Insufficient documentation

## 2015-04-09 DIAGNOSIS — E871 Hypo-osmolality and hyponatremia: Secondary | ICD-10-CM | POA: Insufficient documentation

## 2015-04-09 DIAGNOSIS — Z9049 Acquired absence of other specified parts of digestive tract: Secondary | ICD-10-CM | POA: Insufficient documentation

## 2015-04-09 DIAGNOSIS — M1990 Unspecified osteoarthritis, unspecified site: Secondary | ICD-10-CM | POA: Insufficient documentation

## 2015-04-09 DIAGNOSIS — N401 Enlarged prostate with lower urinary tract symptoms: Secondary | ICD-10-CM | POA: Diagnosis not present

## 2015-04-09 DIAGNOSIS — Z8673 Personal history of transient ischemic attack (TIA), and cerebral infarction without residual deficits: Secondary | ICD-10-CM | POA: Diagnosis not present

## 2015-04-09 DIAGNOSIS — K449 Diaphragmatic hernia without obstruction or gangrene: Secondary | ICD-10-CM | POA: Insufficient documentation

## 2015-04-09 DIAGNOSIS — Z7982 Long term (current) use of aspirin: Secondary | ICD-10-CM | POA: Insufficient documentation

## 2015-04-09 DIAGNOSIS — Z96653 Presence of artificial knee joint, bilateral: Secondary | ICD-10-CM | POA: Insufficient documentation

## 2015-04-09 DIAGNOSIS — N400 Enlarged prostate without lower urinary tract symptoms: Secondary | ICD-10-CM | POA: Insufficient documentation

## 2015-04-09 DIAGNOSIS — Z0000 Encounter for general adult medical examination without abnormal findings: Secondary | ICD-10-CM | POA: Diagnosis not present

## 2015-04-09 DIAGNOSIS — R509 Fever, unspecified: Secondary | ICD-10-CM | POA: Diagnosis present

## 2015-04-09 DIAGNOSIS — N3289 Other specified disorders of bladder: Secondary | ICD-10-CM | POA: Insufficient documentation

## 2015-04-09 DIAGNOSIS — E1142 Type 2 diabetes mellitus with diabetic polyneuropathy: Secondary | ICD-10-CM | POA: Diagnosis not present

## 2015-04-09 DIAGNOSIS — N390 Urinary tract infection, site not specified: Secondary | ICD-10-CM | POA: Diagnosis not present

## 2015-04-09 DIAGNOSIS — R52 Pain, unspecified: Secondary | ICD-10-CM

## 2015-04-09 DIAGNOSIS — B9689 Other specified bacterial agents as the cause of diseases classified elsewhere: Secondary | ICD-10-CM | POA: Diagnosis not present

## 2015-04-09 LAB — I-STAT CG4 LACTIC ACID, ED: Lactic Acid, Venous: 1.6 mmol/L (ref 0.5–2.0)

## 2015-04-09 LAB — COMPREHENSIVE METABOLIC PANEL
ALT: 14 U/L — ABNORMAL LOW (ref 17–63)
AST: 19 U/L (ref 15–41)
Albumin: 3.4 g/dL — ABNORMAL LOW (ref 3.5–5.0)
Alkaline Phosphatase: 50 U/L (ref 38–126)
Anion gap: 11 (ref 5–15)
BUN: 22 mg/dL — ABNORMAL HIGH (ref 6–20)
CO2: 20 mmol/L — ABNORMAL LOW (ref 22–32)
Calcium: 8.3 mg/dL — ABNORMAL LOW (ref 8.9–10.3)
Chloride: 98 mmol/L — ABNORMAL LOW (ref 101–111)
Creatinine, Ser: 1.09 mg/dL (ref 0.61–1.24)
GFR calc Af Amer: >60 mL/min (ref >60)
GFR calc non Af Amer: 59 mL/min — ABNORMAL LOW (ref >60)
Glucose, Bld: 139 mg/dL — ABNORMAL HIGH (ref 65–99)
Potassium: 4 mmol/L (ref 3.5–5.1)
Sodium: 129 mmol/L — ABNORMAL LOW (ref 135–145)
Total Bilirubin: 2.9 mg/dL — ABNORMAL HIGH (ref 0.3–1.2)
Total Protein: 6.4 g/dL — ABNORMAL LOW (ref 6.5–8.1)

## 2015-04-09 LAB — URINALYSIS, ROUTINE W REFLEX MICROSCOPIC
Bilirubin Urine: NEGATIVE
Glucose, UA: 500 mg/dL — AB
Ketones, ur: NEGATIVE mg/dL
Nitrite: POSITIVE — AB
Protein, ur: 100 mg/dL — AB
Specific Gravity, Urine: 1.014 (ref 1.005–1.030)
pH: 6 (ref 5.0–8.0)

## 2015-04-09 LAB — CBC WITH DIFFERENTIAL/PLATELET
Basophils Absolute: 0 10*3/uL (ref 0.0–0.1)
Basophils Relative: 0 %
Eosinophils Absolute: 0 10*3/uL (ref 0.0–0.7)
Eosinophils Relative: 0 %
HCT: 33.1 % — ABNORMAL LOW (ref 39.0–52.0)
Hemoglobin: 12.6 g/dL — ABNORMAL LOW (ref 13.0–17.0)
Lymphocytes Relative: 4 %
Lymphs Abs: 0.4 10*3/uL — ABNORMAL LOW (ref 0.7–4.0)
MCH: 32.4 pg (ref 26.0–34.0)
MCHC: 38.1 g/dL — ABNORMAL HIGH (ref 30.0–36.0)
MCV: 85.1 fL (ref 78.0–100.0)
Monocytes Absolute: 1 10*3/uL (ref 0.1–1.0)
Monocytes Relative: 8 %
Neutro Abs: 11.2 10*3/uL — ABNORMAL HIGH (ref 1.7–7.7)
Neutrophils Relative %: 88 %
Platelets: 117 10*3/uL — ABNORMAL LOW (ref 150–400)
RBC: 3.89 MIL/uL — ABNORMAL LOW (ref 4.22–5.81)
RDW: 12.8 % (ref 11.5–15.5)
WBC: 12.6 10*3/uL — ABNORMAL HIGH (ref 4.0–10.5)

## 2015-04-09 LAB — URINE MICROSCOPIC-ADD ON

## 2015-04-09 MED ORDER — IRBESARTAN 150 MG PO TABS
150.0000 mg | ORAL_TABLET | Freq: Every day | ORAL | Status: DC
  Administered 2015-04-10 – 2015-04-12 (×3): 150 mg via ORAL
  Filled 2015-04-09 (×3): qty 1

## 2015-04-09 MED ORDER — ALBUTEROL SULFATE (2.5 MG/3ML) 0.083% IN NEBU
2.5000 mg | INHALATION_SOLUTION | RESPIRATORY_TRACT | Status: DC | PRN
Start: 2015-04-09 — End: 2015-04-12

## 2015-04-09 MED ORDER — ACETAMINOPHEN 325 MG PO TABS
650.0000 mg | ORAL_TABLET | Freq: Four times a day (QID) | ORAL | Status: DC | PRN
  Administered 2015-04-09 – 2015-04-11 (×4): 650 mg via ORAL
  Filled 2015-04-09 (×4): qty 2

## 2015-04-09 MED ORDER — SODIUM CHLORIDE 0.9 % IV BOLUS (SEPSIS)
1000.0000 mL | Freq: Once | INTRAVENOUS | Status: AC
  Administered 2015-04-09: 1000 mL via INTRAVENOUS

## 2015-04-09 MED ORDER — DEXTROSE 5 % IV SOLN
1.0000 g | INTRAVENOUS | Status: DC
  Administered 2015-04-09 – 2015-04-12 (×4): 1 g via INTRAVENOUS
  Filled 2015-04-09 (×4): qty 10

## 2015-04-09 MED ORDER — PANTOPRAZOLE SODIUM 40 MG PO TBEC
40.0000 mg | DELAYED_RELEASE_TABLET | Freq: Every day | ORAL | Status: DC
  Administered 2015-04-10 – 2015-04-12 (×3): 40 mg via ORAL
  Filled 2015-04-09 (×3): qty 1

## 2015-04-09 MED ORDER — CENTRUM SILVER PO TABS: 1.0000 | ORAL_TABLET | Freq: Every day | ORAL | Status: DC

## 2015-04-09 MED ORDER — ONDANSETRON HCL 4 MG PO TABS: 4.0000 mg | ORAL_TABLET | Freq: Four times a day (QID) | ORAL | Status: DC | PRN

## 2015-04-09 MED ORDER — AMLODIPINE BESYLATE 5 MG PO TABS
5.0000 mg | ORAL_TABLET | Freq: Every day | ORAL | Status: DC
  Administered 2015-04-10 – 2015-04-12 (×3): 5 mg via ORAL
  Filled 2015-04-09 (×3): qty 1

## 2015-04-09 MED ORDER — FINASTERIDE 5 MG PO TABS
5.0000 mg | ORAL_TABLET | Freq: Every day | ORAL | Status: DC
  Administered 2015-04-09 – 2015-04-11 (×3): 5 mg via ORAL
  Filled 2015-04-09 (×4): qty 1

## 2015-04-09 MED ORDER — POLYETHYLENE GLYCOL 3350 17 G PO PACK: 17.0000 g | PACK | Freq: Every day | ORAL | Status: DC | PRN

## 2015-04-09 MED ORDER — ACETAMINOPHEN 650 MG RE SUPP: 650.0000 mg | Freq: Four times a day (QID) | RECTAL | Status: DC | PRN

## 2015-04-09 MED ORDER — ASPIRIN EC 81 MG PO TBEC
81.0000 mg | DELAYED_RELEASE_TABLET | Freq: Every day | ORAL | Status: DC
  Administered 2015-04-09: 81 mg via ORAL
  Filled 2015-04-09 (×4): qty 1

## 2015-04-09 MED ORDER — ONDANSETRON HCL 4 MG/2ML IJ SOLN: 4.0000 mg | Freq: Four times a day (QID) | INTRAMUSCULAR | Status: DC | PRN

## 2015-04-09 MED ORDER — SIMVASTATIN 10 MG PO TABS
10.0000 mg | ORAL_TABLET | Freq: Every day | ORAL | Status: DC
  Administered 2015-04-09 – 2015-04-11 (×3): 10 mg via ORAL
  Filled 2015-04-09 (×3): qty 1

## 2015-04-09 MED ORDER — ADULT MULTIVITAMIN W/MINERALS CH
1.0000 | ORAL_TABLET | Freq: Every day | ORAL | Status: DC
  Administered 2015-04-11 – 2015-04-12 (×2): 1 via ORAL
  Filled 2015-04-09 (×3): qty 1

## 2015-04-09 MED ORDER — ATENOLOL 25 MG PO TABS
25.0000 mg | ORAL_TABLET | Freq: Every day | ORAL | Status: DC
  Administered 2015-04-10 – 2015-04-12 (×3): 25 mg via ORAL
  Filled 2015-04-09 (×3): qty 1

## 2015-04-09 MED ORDER — SODIUM CHLORIDE 0.9 % IV SOLN
INTRAVENOUS | Status: AC
  Administered 2015-04-09 – 2015-04-10 (×2): via INTRAVENOUS

## 2015-04-09 MED FILL — SULFAMETHOXAZOLE-TMP DS TAB: 800-160 | 7 days supply | Qty: 14 | Fill #0

## 2015-04-09 NOTE — ED Notes (Signed)
[facility], positive UTI and fever, temp 102.7 . Pt has hematuria and chills. Alert and oriented x 4.

## 2015-04-09 NOTE — ED Provider Notes (Signed)
CSN: [id number]     Arrival date & time 04/09/15  1041 History   First MD Initiated Contact with Patient 04/09/15 1048     Chief Complaint  Patient presents with  . Urinary Tract Infection     Patient is a 82 y.o. male presenting with urinary tract infection. The history is provided by the patient.  Urinary Tract Infection Pertinent negatives include no chest pain, no abdominal pain, no headaches and no shortness of breath.  Patient presents with fever and urinary tract infection/prostatitis. Sent in by his urologist, Dr. Junious Silk. Over the last 4 days he's had dysuria and fevers. He did have a little bit of confusion. Had been having good oral intake but decreased somewhat yesterday. He states he started with a little bit of blood in the urine and has had frequency and now little bit of incontinence. States he's had chills. No abdominal pain. No nauseousness.  Patient had a urinalysis that showed infection with presumed prostatitis per Dr. Junious Silk. Was given a gram of Rocephin at urology.  Past Medical History  Diagnosis Date  . Allergic rhinitis   . GERD (gastroesophageal reflux disease)     w/ esoph stricture  . Diabetes mellitus   . Hyperlipidemia   . Hypertension   . BPH (benign prostatic hypertrophy)     with weak urinary stream  . Osteoarthritis   . Stroke [facility])     CVA, per CT "old stroke"  . Internal hemorrhoids   . Anemia   . History of colonic polyps   . Polyneuropathy ([place])     NCS by neurology per pt 5/07  . Anxiety   . Hiatal hernia   . Inguinal hernia 2016    Dr. Georgette Dover, Pt does not want to proceed with repair at this time  . Cholecystoduodenal fistula 2016    Dr. Georgette Dover  . Elevated PSA   . Common bile duct stone   . Esophageal stricture    Past Surgical History  Procedure Laterality Date  . Total knee arthroplasty  05/2006    (R)  . Total knee arthroplasty  08/2006    (L)  . Hernia repair      bil inguinal- per pt L in 90s, R in the 80s  . Cataract  extraction Bilateral 01/2009  . Cardiovascular stress test  02-2010    done in [place], had CP, +EKG changes, neg nuclear  imagins  . Ercp w/ sphicterotomy      ????? Dr Deatra Ina  . Cholecystectomy N/A 08/16/2013    Procedure: LAPAROSCOPIC CONVERTED TO OPEN  CHOLECYSTECTOMY WITH INTRAOPERATIVE CHOLANGIOGRAM;  Surgeon: Imogene Burn. Georgette Dover, MD;  Location: [place] [facility];  Service: General;  Laterality: N/A;   Family History  Problem Relation Age of Onset  . Diabetes Brother   . Heart attack Brother 33  . Colon cancer Neg Hx   . Prostate cancer Neg Hx   . Liver cancer Brother    Social History  Substance Use Topics  . Smoking status: Former Research scientist (life sciences)  . Smokeless tobacco: Former Systems developer    Quit date: 01/05/1961  . Alcohol Use: No    Review of Systems  Constitutional: Positive for fever, chills and appetite change. Negative for activity change.  Eyes: Negative for pain.  Respiratory: Negative for chest tightness and shortness of breath.   Cardiovascular: Negative for chest pain and leg swelling.  Gastrointestinal: Negative for nausea, vomiting, abdominal pain and diarrhea.  Genitourinary: Positive for dysuria, urgency and hematuria. Negative for flank pain, penile  swelling, scrotal swelling and testicular pain.  Musculoskeletal: Negative for back pain and neck stiffness.  Skin: Negative for rash.  Neurological: Negative for weakness, numbness and headaches.  Psychiatric/Behavioral: Positive for confusion. Negative for behavioral problems.      Allergies  Amoxicillin  Home Medications   Prior to Admission medications   Medication Sig Start Date End Date Taking? Authorizing Provider  AMBULATORY NON FORMULARY MEDICATION Medication Name: Abdominal Binder Dx: Ventral Hernia (K43.9) 08/21/14  Yes Colon Branch, MD  amLODipine (NORVASC) 5 MG tablet Take 1 tablet (5 mg total) by mouth daily. 02/08/15  Yes Colon Branch, MD  aspirin EC 81 MG tablet Take 81 mg by mouth daily.   Yes Historical Provider, MD  atenolol  (TENORMIN) 50 MG tablet Take 0.5 tablets (25 mg total) by mouth daily. 02/08/15  Yes Colon Branch, MD  finasteride (PROSCAR) 5 MG tablet Take 1 tablet (5 mg total) by mouth daily. 02/08/15  Yes Colon Branch, MD  ibuprofen (ADVIL,MOTRIN) 200 MG tablet Take 200 mg by mouth every 6 (six) hours as needed for moderate pain.   Yes Historical Provider, MD  Multiple Vitamins-Minerals (CENTRUM SILVER) tablet Take 1 tablet by mouth daily.   Yes Historical Provider, MD  omeprazole (PRILOSEC) 20 MG capsule Take 1 capsule (20 mg total) by mouth 2 (two) times daily before a meal. 02/08/15  Yes Colon Branch, MD  simvastatin (ZOCOR) 10 MG tablet Take 1 tablet (10 mg total) by mouth at bedtime. 02/08/15  Yes Colon Branch, MD  valsartan (DIOVAN) 160 MG tablet Take 1 tablet (160 mg total) by mouth daily. 02/08/15  Yes Colon Branch, MD  ALPRAZolam Duanne Moron) 0.5 MG tablet Take 0.5-1 tablets (0.25-0.5 mg total) by mouth 3 (three) times daily as needed for anxiety. Patient not taking: Reported on 04/09/2015 12/18/14   Colon Branch, MD   BP 136/58 mmHg  Pulse 91  Temp(Src) 102.7 F (39.3 C) (Oral)  Resp 18  SpO2 97% Physical Exam  Constitutional: He appears well-developed.  HENT:  Head: Atraumatic.  Eyes: EOM are normal.  Cardiovascular: Normal rate.   Pulmonary/Chest: Effort normal.  Abdominal: Soft.  Musculoskeletal: He exhibits no edema.  Neurological: He is alert.  Skin: Skin is warm.    ED Course  Procedures (including critical care time) Labs Review Labs Reviewed  CBC WITH DIFFERENTIAL/PLATELET - Abnormal; Notable for the following:    WBC 12.6 (*)    RBC 3.89 (*)    Hemoglobin 12.6 (*)    HCT 33.1 (*)    MCHC 38.1 (*)    Platelets 117 (*)    Neutro Abs 11.2 (*)    Lymphs Abs 0.4 (*)    All other components within normal limits  COMPREHENSIVE METABOLIC PANEL - Abnormal; Notable for the following:    Sodium 129 (*)    Chloride 98 (*)    CO2 20 (*)    Glucose, Bld 139 (*)    BUN 22 (*)    Calcium 8.3 (*)     Total Protein 6.4 (*)    Albumin 3.4 (*)    ALT 14 (*)    Total Bilirubin 2.9 (*)    GFR calc non Af Amer 59 (*)    All other components within normal limits  CULTURE, BLOOD (ROUTINE X 2)  CULTURE, BLOOD (ROUTINE X 2)  I-STAT CG4 LACTIC ACID, ED    Imaging Review No results found. I have personally reviewed and evaluated these images and  lab results as part of my medical decision-making.   EKG Interpretation None      MDM   Final diagnoses:  Acute prostatitis  Hyponatremia    Patient sentences fever dysuria and a prostatitis diagnosed by Dr. Junious Silk. Has artery had antibiotics over there. Did have some confusion as a temperature of almost 103 here. Will admit to internal medicine. Urine culture has been done at urology. Normal lactic acid.    Davonna Belling, MD 04/09/15 [phone]

## 2015-04-09 NOTE — ED Notes (Signed)
Bed: WA25 Expected date:  Expected time:  Means of arrival:  Comments: EMS-[facility] 

## 2015-04-09 NOTE — H&P (Addendum)
PATIENT DETAILS Name: [name] Age: 82 y.o. Sex: male Date of Birth: 05-30-1926 Admit Date: 04/09/2015 [id number] [name] Kells, MD Referring Physician: Dr. Alvino Chapel  CHIEF COMPLAINT:  Fever, chills, generalized body aches with hematuria and frequency of urination-3 days  HPI: [name] is a 82 y.o. male with a Past Medical History of hypertension, dyslipidemia, BPH who presents today as a direct referral from urologist's office for evaluation of the above-noted complaints. Per the patient, for the past 3 days associates been having fever with chills. This has been associated with frequency of urination and intermittent hematuria. He also has had generalized body aches. He denies any nasal congestion, rhinorrhea or cough. Denies any chest pain or shortness of breath. Denies any abdominal pain, nausea, vomiting or diarrhea. Upon further evaluation at the urologist's office, he was found to have a grossly infected UA and thought to have acute prostatitis. He was given Rocephin, urine culture was obtained and he was referred to the emergency room for admission. I was subsequently asked to admit this patient for further evaluation and treatment.  ALLERGIES:   Allergies  Allergen Reactions  . Amoxicillin Other (See Comments)    "loose motions" "giddiness" Has patient had a PCN reaction causing immediate rash, facial/tongue/throat swelling, SOB or lightheadedness with hypotension: No Has patient had a PCN reaction causing severe rash involving mucus membranes or skin necrosis: No Has patient had a PCN reaction that required hospitalization No Has patient had a PCN reaction occurring within the last 10 years: No If all of the above answers are "NO", then may proceed with Cephalosporin use.     PAST MEDICAL HISTORY: Past Medical History  Diagnosis Date  . Allergic rhinitis   . GERD (gastroesophageal reflux disease)     w/ esoph stricture  . Diabetes mellitus   .  Hyperlipidemia   . Hypertension   . BPH (benign prostatic hypertrophy)     with weak urinary stream  . Osteoarthritis   . Stroke [facility])     CVA, per CT "old stroke"  . Internal hemorrhoids   . Anemia   . History of colonic polyps   . Polyneuropathy ([place])     NCS by neurology per pt 5/07  . Anxiety   . Hiatal hernia   . Inguinal hernia 2016    Dr. Georgette Dover, Pt does not want to proceed with repair at this time  . Cholecystoduodenal fistula 2016    Dr. Georgette Dover  . Elevated PSA   . Common bile duct stone   . Esophageal stricture     PAST SURGICAL HISTORY: Past Surgical History  Procedure Laterality Date  . Total knee arthroplasty  05/2006    (R)  . Total knee arthroplasty  08/2006    (L)  . Hernia repair      bil inguinal- per pt L in 90s, R in the 80s  . Cataract extraction Bilateral 01/2009  . Cardiovascular stress test  02-2010    done in [place], had CP, +EKG changes, neg nuclear  imagins  . Ercp w/ sphicterotomy      ????? Dr Deatra Ina  . Cholecystectomy N/A 08/16/2013    Procedure: LAPAROSCOPIC CONVERTED TO OPEN  CHOLECYSTECTOMY WITH INTRAOPERATIVE CHOLANGIOGRAM;  Surgeon: Imogene Burn. Georgette Dover, MD;  Location: [place];  Service: General;  Laterality: N/A;    MEDICATIONS AT HOME: Prior to Admission medications   Medication Sig Start Date End Date Taking? Authorizing Provider  AMBULATORY NON FORMULARY MEDICATION  Medication Name: Abdominal Binder Dx: Ventral Hernia (K43.9) 08/21/14  Yes Colon Branch, MD  amLODipine (NORVASC) 5 MG tablet Take 1 tablet (5 mg total) by mouth daily. 02/08/15  Yes Colon Branch, MD  aspirin EC 81 MG tablet Take 81 mg by mouth daily.   Yes Historical Provider, MD  atenolol (TENORMIN) 50 MG tablet Take 0.5 tablets (25 mg total) by mouth daily. 02/08/15  Yes Colon Branch, MD  finasteride (PROSCAR) 5 MG tablet Take 1 tablet (5 mg total) by mouth daily. 02/08/15  Yes Colon Branch, MD  ibuprofen (ADVIL,MOTRIN) 200 MG tablet Take 200 mg by mouth every 6 (six) hours as needed for moderate  pain.   Yes Historical Provider, MD  Multiple Vitamins-Minerals (CENTRUM SILVER) tablet Take 1 tablet by mouth daily.   Yes Historical Provider, MD  omeprazole (PRILOSEC) 20 MG capsule Take 1 capsule (20 mg total) by mouth 2 (two) times daily before a meal. 02/08/15  Yes Colon Branch, MD  simvastatin (ZOCOR) 10 MG tablet Take 1 tablet (10 mg total) by mouth at bedtime. 02/08/15  Yes Colon Branch, MD  valsartan (DIOVAN) 160 MG tablet Take 1 tablet (160 mg total) by mouth daily. 02/08/15  Yes Colon Branch, MD  ALPRAZolam Duanne Moron) 0.5 MG tablet Take 0.5-1 tablets (0.25-0.5 mg total) by mouth 3 (three) times daily as needed for anxiety. Patient not taking: Reported on 04/09/2015 12/18/14   Colon Branch, MD    FAMILY HISTORY: Family History  Problem Relation Age of Onset  . Diabetes Brother   . Heart attack Brother 38  . Colon cancer Neg Hx   . Prostate cancer Neg Hx   . Liver cancer Brother     SOCIAL HISTORY:  reports that he has quit smoking. He quit smokeless tobacco use about 54 years ago. He reports that he does not drink alcohol or use illicit drugs. Lives at: Home Mobility: Independent  REVIEW OF SYSTEMS:  Constitutional:   No  weight loss, night sweats,  fatigue.  HEENT:    No headaches, Dysphagia,Tooth/dental problems,Sore throat,  No sneezing, itching, ear ache, nasal congestion, post nasal drip  Cardio-vascular: No chest pain,Orthopnea, PND,lower extremity edema, anasarca, palpitations  GI:  No heartburn, indigestion, abdominal pain, nausea, vomiting, diarrhea, melena or hematochezia  Resp: No shortness of breath, cough, hemoptysis,plueritic chest pain.   Skin:  No rash or lesions.  GU:  No dysuria,  No flank pain.  Musculoskeletal: No joint pain or swelling.  No decreased range of motion.  No back pain.  Endocrine: No heat intolerance, no cold intolerance, no polyuria, no polydipsia  Psych: No change in mood or affect. No depression or anxiety.  No memory  loss.  PHYSICAL EXAM: Blood pressure 136/58, pulse 91, temperature 102.7 F (39.3 C), temperature source Oral, resp. rate 18, SpO2 97 %.  General appearance :Awake, alert, not in any distress. Speech Clear. Not toxic Looking HEENT: Atraumatic and Normocephalic, pupils equally reactive to light and accomodation Neck: supple, no JVD. No cervical lymphadenopathy.  Chest:Good air entry bilaterally, no added sounds  CVS: S1 S2 regular, no murmurs.  Abdomen: Bowel sounds present, Non tender and not distended with no gaurding, rigidity or rebound. Right upper quadrant cholecystectomy scar present with incisional hernia. Extremities: B/L Lower Ext shows no edema, both legs are warm to touch Neurology:  Non focal Skin:No Rash Wounds:N/A  LABS ON ADMISSION:   Recent Labs  04/09/15 1110  NA 129*  K 4.0  CL 98*  CO2 20*  GLUCOSE 139*  BUN 22*  CREATININE 1.09  CALCIUM 8.3*    Recent Labs  04/09/15 1110  AST 19  ALT 14*  ALKPHOS 50  BILITOT 2.9*  PROT 6.4*  ALBUMIN 3.4*   No results for input(s): LIPASE, AMYLASE in the last 72 hours.  Recent Labs  04/09/15 1110  WBC 12.6*  NEUTROABS 11.2*  HGB 12.6*  HCT 33.1*  MCV 85.1  PLT 117*   No results for input(s): CKTOTAL, CKMB, CKMBINDEX, TROPONINI in the last 72 hours. No results for input(s): DDIMER in the last 72 hours. Invalid input(s): POCBNP   RADIOLOGIC STUDIES ON ADMISSION: No results found.  ASSESSMENT AND PLAN: Present on Admission:  . Systemic inflammatory response syndrome due to Acute prostatitis with hematuria: Continue Rocephin, gently hydrate, blood cultures have been obtained. Urine cultures also have been obtained. Urine cultures were also drawn and are processing at [facility]. Follow cultures, and narrow antibiotics accordingly. Patient currently appears nontoxic appearing and stable.  Marland KitchenHyponatremia: mild-likley secondary to SIR's-hydrate with IVF-follow  . Hyperlipidemia: Continue  statin.  Marland Kitchen GERD: Continue PPI   . Essential hypertension: Stable, continue prior medications. Follow and adjust accordingly.   Marland Kitchen BPH (benign prostatic hyperplasia): Continue Flomax and finasteride.  Further plan will depend as patient's clinical course evolves and further radiologic and laboratory data become available. Patient will be monitored closely.  Above noted plan was discussed with patient/spouse face to face at bedside, they were in agreement.   CONSULTS: None  DVT Prophylaxis: SCD's  Code Status: Full Code  Disposition Plan:  Discharge back home possibly in 1-2 days  Total time spent  45 minutes.Greater than 50% of this time was spent in counseling, explanation of diagnosis, planning of further management, and coordination of care.  [facility] Pager [phone]  If 7PM-7AM, please contact night-coverage [email or web address] Password [email or web address] 04/09/2015, 1:13 PM

## 2015-04-10 DIAGNOSIS — N3001 Acute cystitis with hematuria: Secondary | ICD-10-CM | POA: Diagnosis not present

## 2015-04-10 DIAGNOSIS — N400 Enlarged prostate without lower urinary tract symptoms: Secondary | ICD-10-CM | POA: Diagnosis not present

## 2015-04-10 DIAGNOSIS — K219 Gastro-esophageal reflux disease without esophagitis: Secondary | ICD-10-CM | POA: Diagnosis not present

## 2015-04-10 DIAGNOSIS — N410 Acute prostatitis: Secondary | ICD-10-CM | POA: Diagnosis not present

## 2015-04-10 DIAGNOSIS — I10 Essential (primary) hypertension: Secondary | ICD-10-CM | POA: Diagnosis not present

## 2015-04-10 LAB — BASIC METABOLIC PANEL
Anion gap: 10 (ref 5–15)
BUN: 19 mg/dL (ref 6–20)
CO2: 21 mmol/L — ABNORMAL LOW (ref 22–32)
Calcium: 8 mg/dL — ABNORMAL LOW (ref 8.9–10.3)
Chloride: 103 mmol/L (ref 101–111)
Creatinine, Ser: 1.01 mg/dL (ref 0.61–1.24)
GFR calc Af Amer: >60 mL/min (ref >60)
GFR calc non Af Amer: >60 mL/min (ref >60)
Glucose, Bld: 154 mg/dL — ABNORMAL HIGH (ref 65–99)
Potassium: 3.5 mmol/L (ref 3.5–5.1)
Sodium: 134 mmol/L — ABNORMAL LOW (ref 135–145)

## 2015-04-10 LAB — URINE CULTURE: Culture: 4000 — AB

## 2015-04-10 LAB — CBC
HCT: 28.7 % — ABNORMAL LOW (ref 39.0–52.0)
Hemoglobin: 10.6 g/dL — ABNORMAL LOW (ref 13.0–17.0)
MCH: 30.8 pg (ref 26.0–34.0)
MCHC: 36.9 g/dL — ABNORMAL HIGH (ref 30.0–36.0)
MCV: 83.4 fL (ref 78.0–100.0)
Platelets: 94 10*3/uL — ABNORMAL LOW (ref 150–400)
RBC: 3.44 MIL/uL — ABNORMAL LOW (ref 4.22–5.81)
RDW: 12.8 % (ref 11.5–15.5)
WBC: 11.8 10*3/uL — ABNORMAL HIGH (ref 4.0–10.5)

## 2015-04-10 NOTE — Progress Notes (Signed)
PROGRESS NOTE  [name] [id number] DOB: 11-Jan-1926 DOA: 04/09/2015 PCP: Kathlene November, MD Outpatient Specialists:    LOS: 1 day   Brief Narrative: 82 year old male with hypertension, hyperlipidemia, BPH, was admitted on 4/4 directly from Dr. Lyndal Rainbow office for fever or chills hematuria and increased frequency for 3 days, started on IV antibiotics for urinary tract infection  Assessment & Plan: Active Problems:   Hyperlipidemia   Essential hypertension   GERD   BPH (benign prostatic hyperplasia)--sees urology   Acute prostatitis with hematuria   SIRS (systemic inflammatory response syndrome) (HCC)  Sepsis due to urinary tract infection - Patient with leukocytosis, fever, and a source - Started on ceftriaxone, urine cultures were obtained in the urologist's office, discussed with Dr. Junious Silk today, his office will follow cultures, will discuss again tomorrow regarding results - Cultures obtained here as well, however after he has been given antibiotics so likely would remain negative - Still febrile overnight last night, symptomatic  Hyponatremia - mild-likley secondary to SIR's - improving with hydration, sodium 134 this morning  Hyperlipidemia - Continue statin.  GERD  - Continue PPI   Essential hypertension  - Stable, continue prior medications. Follow and adjust accordingly.   BPH (benign prostatic hyperplasia)  - Continue finasteride.  Thrombocytopenia - Possibly in the setting of sepsis, closely monitor  DVT prophylaxis: SCD Code Status: Full Family Communication: no family bedside Disposition Plan: home when ready  Barriers for discharge: IV antibiotics  Consultants:   Urology  Procedures:   None   Antimicrobials:  Ceftriaxone   Subjective: - feeling well, endorsed chills overnight  Objective: Filed Vitals:   04/09/15 2125 04/10/15 0022 04/10/15 0233 04/10/15 0517  BP: 103/54   114/49  Pulse: 78   81  Temp: 97.8 F (36.6 C) 100.8  F (38.2 C) 98 F (36.7 C) 98.2 F (36.8 C)  TempSrc: Oral Oral Axillary Oral  Resp: 18   17  Height:      Weight:      SpO2: 99%   98%    Intake/Output Summary (Last 24 hours) at 04/10/15 1259 Last data filed at 04/10/15 0521  Gross per 24 hour  Intake 1386.25 ml  Output    300 ml  Net 1086.25 ml   Filed Weights   04/09/15 1413  Weight: 60.873 kg (134 lb 3.2 oz)    Examination: Constitutional: NAD, calm, comfortable Filed Vitals:   04/09/15 2125 04/10/15 0022 04/10/15 0233 04/10/15 0517  BP: 103/54   114/49  Pulse: 78   81  Temp: 97.8 F (36.6 C) 100.8 F (38.2 C) 98 F (36.7 C) 98.2 F (36.8 C)  TempSrc: Oral Oral Axillary Oral  Resp: 18   17  Height:      Weight:      SpO2: 99%   98%   Eyes: PERRL, lids and conjunctivae normal ENMT: Mucous membranes are moist.  Neck: normal, supple Respiratory: clear to auscultation bilaterally, no wheezing, no crackles. Normal respiratory effort. No accessory muscle use.  Cardiovascular: Regular rate and rhythm, no murmurs / rubs / gallops. No extremity edema. Abdomen: no tenderness, no masses palpated. Bowel sounds positive.  Musculoskeletal: no clubbing / cyanosis.  Neurologic: non focal  Psychiatric: Normal judgment and insight. Alert and oriented x 3. Normal mood.    Data Reviewed: I have personally reviewed following labs and imaging studies  CBC:  Recent Labs Lab 04/09/15 1110 04/10/15 0325  WBC 12.6* 11.8*  NEUTROABS 11.2*  --   HGB 12.6* 10.6*  HCT 33.1* 28.7*  MCV 85.1 83.4  PLT 117* 94*   Basic Metabolic Panel:  Recent Labs Lab 04/09/15 1110 04/10/15 0325  NA 129* 134*  K 4.0 3.5  CL 98* 103  CO2 20* 21*  GLUCOSE 139* 154*  BUN 22* 19  CREATININE 1.09 1.01  CALCIUM 8.3* 8.0*   GFR: Estimated Creatinine Clearance: 45.2 mL/min (by C-G formula based on Cr of 1.01). Liver Function Tests:  Recent Labs Lab 04/09/15 1110  AST 19  ALT 14*  ALKPHOS 50  BILITOT 2.9*  PROT 6.4*  ALBUMIN  3.4*   Urine analysis:    Component Value Date/Time   COLORURINE AMBER* 04/09/2015 1305   APPEARANCEUR CLOUDY* 04/09/2015 1305   LABSPEC 1.014 04/09/2015 1305   PHURINE 6.0 04/09/2015 1305   GLUCOSEU 500* 04/09/2015 1305   HGBUR LARGE* 04/09/2015 1305   HGBUR large 03/26/2008 1151   BILIRUBINUR NEGATIVE 04/09/2015 1305   KETONESUR NEGATIVE 04/09/2015 1305   PROTEINUR 100* 04/09/2015 1305   UROBILINOGEN 0.2 03/26/2008 1151   NITRITE POSITIVE* 04/09/2015 1305   LEUKOCYTESUR MODERATE* 04/09/2015 1305   Sepsis Labs: Invalid input(s): PROCALCITONIN, LACTICIDVEN  Recent Results (from the past 240 hour(s))  Culture, blood (routine x 2)     Status: None (Preliminary result)   Collection Time: 04/09/15 11:05 AM  Result Value Ref Range Status   Specimen Description BLOOD RIGHT FOREARM  Final   Special Requests   Final    BOTTLES DRAWN AEROBIC AND ANAEROBIC 5CC Performed at [facility]    Culture PENDING  Incomplete   Report Status PENDING  Incomplete  Culture, blood (routine x 2)     Status: None (Preliminary result)   Collection Time: 04/09/15 11:16 AM  Result Value Ref Range Status   Specimen Description BLOOD LEFT FOREARM  Final   Special Requests   Final    BOTTLES DRAWN AEROBIC AND ANAEROBIC 5CC Performed at [facility]    Culture PENDING  Incomplete   Report Status PENDING  Incomplete  Urine culture     Status: Abnormal   Collection Time: 04/09/15  1:05 PM  Result Value Ref Range Status   Specimen Description URINE, RANDOM  Final   Special Requests NONE  Final   Culture (A)  Final    4,000 COLONIES/mL INSIGNIFICANT GROWTH Performed at [facility]    Report Status 04/10/2015 FINAL  Final      Radiology Studies: No results found.   Scheduled Meds: . amLODipine  5 mg Oral Daily  . aspirin EC  81 mg Oral Daily  . atenolol  25 mg Oral Daily  . cefTRIAXone (ROCEPHIN)  IV  1 g Intravenous Q24H  . finasteride  5 mg Oral Daily  . irbesartan   150 mg Oral Daily  . multivitamin with minerals  1 tablet Oral Daily  . pantoprazole  40 mg Oral Daily  . simvastatin  10 mg Oral QHS   Continuous Infusions: . sodium chloride 75 mL/hr at 04/10/15 0130    Marzetta Board, MD, PhD [facility] Pager [phone]  If 7PM-7AM, please contact night-coverage [email or web address] Password [facility] 04/10/2015, 12:59 PM

## 2015-04-10 NOTE — Care Management Obs Status (Signed)
[place] NOTIFICATION   Patient Details  Name: AMILLION HELDERMAN MRN: [id number] Date of Birth: September 25, 1926   Medicare Observation Status Notification Given:  Yes    Lynnell Catalan, RN 04/10/2015, 3:41 PM

## 2015-04-11 ENCOUNTER — Observation Stay (HOSPITAL_COMMUNITY): Payer: Medicare Other

## 2015-04-11 DIAGNOSIS — N400 Enlarged prostate without lower urinary tract symptoms: Secondary | ICD-10-CM | POA: Diagnosis not present

## 2015-04-11 DIAGNOSIS — N410 Acute prostatitis: Secondary | ICD-10-CM | POA: Diagnosis not present

## 2015-04-11 DIAGNOSIS — N3001 Acute cystitis with hematuria: Secondary | ICD-10-CM | POA: Diagnosis not present

## 2015-04-11 DIAGNOSIS — R319 Hematuria, unspecified: Secondary | ICD-10-CM | POA: Diagnosis not present

## 2015-04-11 LAB — CBC
HCT: 29.8 % — ABNORMAL LOW (ref 39.0–52.0)
Hemoglobin: 11.4 g/dL — ABNORMAL LOW (ref 13.0–17.0)
MCH: 32.3 pg (ref 26.0–34.0)
MCHC: 38.3 g/dL — ABNORMAL HIGH (ref 30.0–36.0)
MCV: 84.4 fL (ref 78.0–100.0)
Platelets: 107 10*3/uL — ABNORMAL LOW (ref 150–400)
RBC: 3.53 MIL/uL — ABNORMAL LOW (ref 4.22–5.81)
RDW: 12.9 % (ref 11.5–15.5)
WBC: 10.4 10*3/uL (ref 4.0–10.5)

## 2015-04-11 NOTE — Progress Notes (Signed)
PROGRESS NOTE  [name] [id number] DOB: [birth year] DOA: 04/09/2015 PCP: Kathlene November, MD Outpatient Specialists:    LOS: 2 days   Brief Narrative: 82 year old male with hypertension, hyperlipidemia, BPH, was admitted on 4/4 directly from Dr. Lyndal Rainbow office for fever or chills hematuria and increased frequency for 3 days, started on IV antibiotics for urinary tract infection  Assessment & Plan: Active Problems:   Hyperlipidemia   Essential hypertension   GERD   BPH (benign prostatic hyperplasia)--sees urology   Acute prostatitis with hematuria   SIRS (systemic inflammatory response syndrome) (HCC)   Sepsis due to urinary tract infection - Patient with leukocytosis, fever, and a source - Started on ceftriaxone, urine cultures were obtained in the urologist's office, discussed with Dr. Junious Silk 4/5, his office will follow cultures, will discuss again today regarding results - Cultures obtained here as well, however after he has been given antibiotics so remained negative - Still febrile overnight last night, fever curve improving though - no prostatitis as not tender on exam   Hyponatremia - mild - improved with hydration  Hyperlipidemia - Continue statin.  GERD  - Continue PPI   Essential hypertension  - Stable, continue prior medications. Follow and adjust accordingly.   BPH (benign prostatic hyperplasia)  - Continue finasteride.  Thrombocytopenia - Possibly in the setting of sepsis, closely monitor  DVT prophylaxis: SCD Code Status: Full Family Communication: d/w wife bedside and sister in law (ID physician) over the phone  Disposition Plan: home when ready  Barriers for discharge: IV antibiotics  Consultants:   Urology  Procedures:   None   Antimicrobials:  Ceftriaxone   Subjective: - feeling well today, denies chills  Objective: Filed Vitals:   04/10/15 1657 04/10/15 2151 04/11/15 0544 04/11/15 0745  BP: 155/61 123/42 126/52   Pulse:  91 78 88   Temp: 99.5 F (37.5 C) 99.8 F (37.7 C) 100.6 F (38.1 C) 97.8 F (36.6 C)  TempSrc: Oral Oral Oral Oral  Resp: 18 18 18    Height:      Weight:      SpO2: 95% 96% 93%     Intake/Output Summary (Last 24 hours) at 04/11/15 1307 Last data filed at 04/11/15 0942  Gross per 24 hour  Intake    713 ml  Output    900 ml  Net   -187 ml   Filed Weights   04/09/15 1413  Weight: 60.873 kg (134 lb 3.2 oz)    Examination: Constitutional: NAD, calm, comfortable Filed Vitals:   04/10/15 1657 04/10/15 2151 04/11/15 0544 04/11/15 0745  BP: 155/61 123/42 126/52   Pulse: 91 78 88   Temp: 99.5 F (37.5 C) 99.8 F (37.7 C) 100.6 F (38.1 C) 97.8 F (36.6 C)  TempSrc: Oral Oral Oral Oral  Resp: 18 18 18    Height:      Weight:      SpO2: 95% 96% 93%    Eyes: PERRL, lids and conjunctivae normal ENMT: Mucous membranes are moist.  Neck: normal, supple Respiratory: clear to auscultation bilaterally, no wheezing, no crackles. Normal respiratory effort. No accessory muscle use.  Cardiovascular: Regular rate and rhythm, no murmurs / rubs / gallops. No extremity edema. Abdomen/GI/GU: no tenderness, no masses palpated. Bowel sounds positive. normal rectal exam, no prostate tenderness   Musculoskeletal: no clubbing / cyanosis.  Neurologic: non focal  Psychiatric: Normal judgment and insight. Alert and oriented x 3. Normal mood.    Data Reviewed: I have personally reviewed following labs and  imaging studies  CBC:  Recent Labs Lab 04/09/15 1110 04/10/15 0325 04/11/15 0336  WBC 12.6* 11.8* 10.4  NEUTROABS 11.2*  --   --   HGB 12.6* 10.6* 11.4*  HCT 33.1* 28.7* 29.8*  MCV 85.1 83.4 84.4  PLT 117* 94* [id number]*   Basic Metabolic Panel:  Recent Labs Lab 04/09/15 1110 04/10/15 0325  NA 129* 134*  K 4.0 3.5  CL 98* 103  CO2 20* 21*  GLUCOSE 139* 154*  BUN 22* 19  CREATININE 1.09 1.01  CALCIUM 8.3* 8.0*   GFR: Estimated Creatinine Clearance: 45.2 mL/min (by C-G formula  based on Cr of 1.01). Liver Function Tests:  Recent Labs Lab 04/09/15 1110  AST 19  ALT 14*  ALKPHOS 50  BILITOT 2.9*  PROT 6.4*  ALBUMIN 3.4*   Urine analysis:    Component Value Date/Time   COLORURINE AMBER* 04/09/2015 1305   APPEARANCEUR CLOUDY* 04/09/2015 1305   LABSPEC 1.014 04/09/2015 1305   PHURINE 6.0 04/09/2015 1305   GLUCOSEU 500* 04/09/2015 1305   HGBUR LARGE* 04/09/2015 1305   HGBUR large 03/26/2008 1151   BILIRUBINUR NEGATIVE 04/09/2015 1305   KETONESUR NEGATIVE 04/09/2015 1305   PROTEINUR 100* 04/09/2015 1305   UROBILINOGEN 0.2 03/26/2008 1151   NITRITE POSITIVE* 04/09/2015 1305   LEUKOCYTESUR MODERATE* 04/09/2015 1305   Sepsis Labs: Invalid input(s): PROCALCITONIN, LACTICIDVEN  Recent Results (from the past 240 hour(s))  Culture, blood (routine x 2)     Status: None (Preliminary result)   Collection Time: 04/09/15 11:05 AM  Result Value Ref Range Status   Specimen Description BLOOD RIGHT FOREARM  Final   Special Requests BOTTLES DRAWN AEROBIC AND ANAEROBIC 5CC  Final   Culture   Final    NO GROWTH 2 DAYS Performed at [facility]    Report Status PENDING  Incomplete  Culture, blood (routine x 2)     Status: None (Preliminary result)   Collection Time: 04/09/15 11:16 AM  Result Value Ref Range Status   Specimen Description BLOOD LEFT FOREARM  Final   Special Requests BOTTLES DRAWN AEROBIC AND ANAEROBIC 5CC  Final   Culture   Final    NO GROWTH 2 DAYS Performed at [facility]    Report Status PENDING  Incomplete  Urine culture     Status: Abnormal   Collection Time: 04/09/15  1:05 PM  Result Value Ref Range Status   Specimen Description URINE, RANDOM  Final   Special Requests NONE  Final   Culture (A)  Final    4,000 COLONIES/mL INSIGNIFICANT GROWTH Performed at [facility]    Report Status 04/10/2015 FINAL  Final      Radiology Studies: US Renal  04/11/2015  CLINICAL DATA:  Urinary tract infection.  Hematuria.  EXAM: RENAL / URINARY TRACT ULTRASOUND COMPLETE COMPARISON:  CT 11/14/2013 FINDINGS: Right Kidney: Length: 12.1 cm. Echogenicity within normal limits. No mass or hydronephrosis visualized. Left Kidney: Length: 10.8 cm. Echogenicity within normal limits. No mass or hydronephrosis visualized. Bladder: Appears normal for degree of bladder distention. IMPRESSION: Normal examination. No sign of focal urinary tract infection or cause of hematuria. Electronically Signed   By: Nelson Chimes M.D.   On: 04/11/2015 12:25     Scheduled Meds: . amLODipine  5 mg Oral Daily  . aspirin EC  81 mg Oral Daily  . atenolol  25 mg Oral Daily  . cefTRIAXone (ROCEPHIN)  IV  1 g Intravenous Q24H  . finasteride  5 mg Oral Daily  .  irbesartan  150 mg Oral Daily  . multivitamin with minerals  1 tablet Oral Daily  . pantoprazole  40 mg Oral Daily  . simvastatin  10 mg Oral QHS   Continuous Infusions:    Marzetta Board, MD, PhD [facility] Pager [phone]  If 7PM-7AM, please contact night-coverage [email or web address] Password [email or web address] 04/11/2015, 1:07 PM

## 2015-04-12 DIAGNOSIS — I10 Essential (primary) hypertension: Secondary | ICD-10-CM

## 2015-04-12 DIAGNOSIS — N3001 Acute cystitis with hematuria: Secondary | ICD-10-CM | POA: Diagnosis not present

## 2015-04-12 DIAGNOSIS — N410 Acute prostatitis: Secondary | ICD-10-CM | POA: Diagnosis not present

## 2015-04-12 DIAGNOSIS — N400 Enlarged prostate without lower urinary tract symptoms: Secondary | ICD-10-CM | POA: Diagnosis not present

## 2015-04-12 DIAGNOSIS — N390 Urinary tract infection, site not specified: Secondary | ICD-10-CM | POA: Diagnosis present

## 2015-04-12 DIAGNOSIS — K219 Gastro-esophageal reflux disease without esophagitis: Secondary | ICD-10-CM

## 2015-04-12 MED ORDER — CIPROFLOXACIN HCL 500 MG PO TABS: 500.0000 mg | ORAL_TABLET | Freq: Two times a day (BID) | ORAL | Status: DC

## 2015-04-12 MED FILL — CIPROFLOXACIN HCL 500 MG TA: 500 | 7 days supply | Qty: 14 | Fill #0

## 2015-04-12 NOTE — Discharge Summary (Addendum)
Physician Discharge Summary  [name] [id number] DOB: June 22, 1926 DOA: 04/09/2015  PCP: Kathlene November, MD  Admit date: 04/09/2015 Discharge date: 04/12/2015  Time spent: > 30 minutes  Recommendations for Outpatient Follow-up:  1. Follow up with Dr. Junious Silk in 1-2 weeks 2. Follow up with Dr. [name] as scheduled in 5 days  Discharge Diagnoses:  Active Problems:   Hyperlipidemia   Essential hypertension   GERD   BPH (benign prostatic hyperplasia)--sees urology   SIRS (systemic inflammatory response syndrome) (HCC)   UTI (urinary tract infection)  Discharge Condition: stable  Diet recommendation: regular  Filed Weights   04/09/15 1413  Weight: 60.873 kg (134 lb 3.2 oz)    History of present illness:  See H&P, Labs, Consult and Test reports for all details in brief, patient is a 82 year old male with hypertension, hyperlipidemia, BPH, was admitted on 4/4 directly from Dr. Lyndal Rainbow office for fever or chills hematuria and increased frequency for 3 days, started on IV antibiotics for urinary tract infection  Hospital Course:  Sepsis due to urinary tract infection - Patient with leukocytosis, fever, and a source, He was initially started on ceftriaxone, his leukocytosis resolved, his fever resolved, and patient was feeling back to baseline. No prostate tenderness on exam suggesting against bacterial prostatitis. Urine cultures here in the hospital were without growth, however these were obtained after antibiotics. I discussed with urology on 4/6, urine cultures obtained in the office prior to admission showed Escherichia coli which is sensitive to ciprofloxacin and amoxicillin. His antibiotics were narrowed to ciprofloxacin as he is amoxicillin allergic and he is to complete 7 additional days as an outpatient.  Hyponatremia - mild, improving with hydration Hyperlipidemia - Continue statin. GERD - Continue PPI  Essential hypertension - Stable, continue prior medications. Follow and  adjust accordingly.  BPH (benign prostatic hyperplasia) - Continue finasteride. Thrombocytopenia - Possibly in the setting of sepsis, improving  Procedures:  None    Consultations:  None   Discharge Exam: Filed Vitals:   04/11/15 1510 04/11/15 2140 04/11/15 2210 04/12/15 0553  BP: 135/52  120/45 125/50  Pulse: 81  78 80  Temp: 98.5 F (36.9 C) 98.2 F (36.8 C) 99.4 F (37.4 C) 99.8 F (37.7 C)  TempSrc: Oral Oral Oral Oral  Resp: 18  20 20   Height:      Weight:      SpO2: 98%  98% 97%    General: NAD Cardiovascular: RRR Respiratory: CTA biL  Discharge Instructions Activity:  As tolerated   Get Medicines reviewed and adjusted: Please take all your medications with you for your next visit with your Primary MD  Please request your Primary MD to go over all hospital tests and procedure/radiological results at the follow up, please ask your Primary MD to get all Hospital records sent to his/her office.  If you experience worsening of your admission symptoms, develop shortness of breath, life threatening emergency, suicidal or homicidal thoughts you must seek medical attention immediately by calling 911 or calling your MD immediately if symptoms less severe.  You must read complete instructions/literature along with all the possible adverse reactions/side effects for all the Medicines you take and that have been prescribed to you. Take any new Medicines after you have completely understood and accpet all the possible adverse reactions/side effects.   Do not drive when taking Pain medications.   Do not take more than prescribed Pain, Sleep and Anxiety Medications  Special Instructions: If you have smoked or chewed Tobacco in  the last 2 yrs please stop smoking, stop any regular Alcohol and or any Recreational drug use.  Wear Seat belts while driving.  Please note  You were cared for by a hospitalist during your hospital stay. Once you are discharged, your primary  care physician will handle any further medical issues. Please note that NO REFILLS for any discharge medications will be authorized once you are discharged, as it is imperative that you return to your primary care physician (or establish a relationship with a primary care physician if you do not have one) for your aftercare needs so that they can reassess your need for medications and monitor your lab values.    Medication List    TAKE these medications        ALPRAZolam 0.5 MG tablet  Commonly known as:  XANAX  Take 0.5-1 tablets (0.25-0.5 mg total) by mouth 3 (three) times daily as needed for anxiety.     AMBULATORY NON FORMULARY MEDICATION  Medication Name: Abdominal Binder Dx: Ventral Hernia (K43.9)     amLODipine 5 MG tablet  Commonly known as:  NORVASC  Take 1 tablet (5 mg total) by mouth daily.     aspirin EC 81 MG tablet  Take 81 mg by mouth daily.     atenolol 50 MG tablet  Commonly known as:  TENORMIN  Take 0.5 tablets (25 mg total) by mouth daily.     CENTRUM SILVER tablet  Take 1 tablet by mouth daily.     ciprofloxacin 500 MG tablet  Commonly known as:  CIPRO  Take 1 tablet (500 mg total) by mouth 2 (two) times daily.     finasteride 5 MG tablet  Commonly known as:  PROSCAR  Take 1 tablet (5 mg total) by mouth daily.     ibuprofen 200 MG tablet  Commonly known as:  ADVIL,MOTRIN  Take 200 mg by mouth every 6 (six) hours as needed for moderate pain.     omeprazole 20 MG capsule  Commonly known as:  PRILOSEC  Take 1 capsule (20 mg total) by mouth 2 (two) times daily before a meal.     simvastatin 10 MG tablet  Commonly known as:  ZOCOR  Take 1 tablet (10 mg total) by mouth at bedtime.     valsartan 160 MG tablet  Commonly known as:  DIOVAN  Take 1 tablet (160 mg total) by mouth daily.        The results of significant diagnostics from this hospitalization (including imaging, microbiology, ancillary and laboratory) are listed below for reference.     Significant Diagnostic Studies: US Renal  04/11/2015  CLINICAL DATA:  Urinary tract infection.  Hematuria. EXAM: RENAL / URINARY TRACT ULTRASOUND COMPLETE COMPARISON:  CT 11/14/2013 FINDINGS: Right Kidney: Length: 12.1 cm. Echogenicity within normal limits. No mass or hydronephrosis visualized. Left Kidney: Length: 10.8 cm. Echogenicity within normal limits. No mass or hydronephrosis visualized. Bladder: Appears normal for degree of bladder distention. IMPRESSION: Normal examination. No sign of focal urinary tract infection or cause of hematuria. Electronically Signed   By: Nelson Chimes M.D.   On: 04/11/2015 12:25    Microbiology: Recent Results (from the past 240 hour(s))  Culture, blood (routine x 2)     Status: None (Preliminary result)   Collection Time: 04/09/15 11:05 AM  Result Value Ref Range Status   Specimen Description BLOOD RIGHT FOREARM  Final   Special Requests BOTTLES DRAWN AEROBIC AND ANAEROBIC 5CC  Final   Culture   Final  NO GROWTH 3 DAYS Performed at [facility]    Report Status PENDING  Incomplete  Culture, blood (routine x 2)     Status: None (Preliminary result)   Collection Time: 04/09/15 11:16 AM  Result Value Ref Range Status   Specimen Description BLOOD LEFT FOREARM  Final   Special Requests BOTTLES DRAWN AEROBIC AND ANAEROBIC 5CC  Final   Culture   Final    NO GROWTH 3 DAYS Performed at [facility]    Report Status PENDING  Incomplete  Urine culture     Status: Abnormal   Collection Time: 04/09/15  1:05 PM  Result Value Ref Range Status   Specimen Description URINE, RANDOM  Final   Special Requests NONE  Final   Culture (A)  Final    4,000 COLONIES/mL INSIGNIFICANT GROWTH Performed at [facility]    Report Status 04/10/2015 FINAL  Final    Labs: Basic Metabolic Panel:  Recent Labs Lab 04/09/15 1110 04/10/15 0325  NA 129* 134*  K 4.0 3.5  CL 98* 103  CO2 20* 21*  GLUCOSE 139* 154*  BUN 22* 19  CREATININE 1.09  1.01  CALCIUM 8.3* 8.0*   Liver Function Tests:  Recent Labs Lab 04/09/15 1110  AST 19  ALT 14*  ALKPHOS 50  BILITOT 2.9*  PROT 6.4*  ALBUMIN 3.4*   CBC:  Recent Labs Lab 04/09/15 1110 04/10/15 0325 04/11/15 0336  WBC 12.6* 11.8* 10.4  NEUTROABS 11.2*  --   --   HGB 12.6* 10.6* 11.4*  HCT 33.1* 28.7* 29.8*  MCV 85.1 83.4 84.4  PLT 117* 94* 107*     Signed:  GHERGHE, COSTIN  [facility] 04/12/2015, 2:16 PM

## 2015-04-12 NOTE — Progress Notes (Signed)
Nursing Discharge Summary  Patient ID: [name] MRN: [id number] DOB/AGE: 04/22/1926 82 y.o.  Admit date: 04/09/2015 Discharge date: 04/12/2015  Discharged Condition: good  Disposition: Home    Prescriptions Given: Patient given prescription for Cipro.  Patient follow up appointments and medications discussed.  Patient and family member Jeannett Senior verbalized understanding without further questions.    Means of Discharge: patient taken downstairs via wheelchair to be discharged home.  Patient stable from AM assessment.   Signed: Buel Ream 04/12/2015, 5:35 PM

## 2015-04-14 LAB — CULTURE, BLOOD (ROUTINE X 2)
Culture: NO GROWTH
Culture: NO GROWTH

## 2015-04-15 ENCOUNTER — Telehealth: Payer: Self-pay

## 2015-04-15 NOTE — Telephone Encounter (Signed)
thx

## 2015-04-15 NOTE — Telephone Encounter (Addendum)
04/15/2015  Transition Care Management Follow-up Telephone Call  ADMISSION DATE: 04/09/15  DISCHARGE DATE: 04/12/15    How have you been since you were released from the hospital?Patient states he has been feeling some weakness but no other complications. Appetite ok.   Do you understand why you were in the hospital? YES, per patient   Do you understand the discharge instrcutions? Yes, per patient  Items Reviewed:  Medications reviewed:   Reviewed medications with patient. States he has stopped taking Aspirin 81 mg and Xanax 0.5 mg  Allergies reviewed: Reviewed  medication allergies with patient, States he is only allergic to Amoxicillin.  Dietary changes reviewed: Regular Referrals reviewed: Patient scheduled to see Dr. [name]. States he tried to schedule appointment with Dr. Patsy [place] Urologist but he was out of the office. States he will try to schedule again next week when he returns. Functional Questionnaire:   Activities of Daily Living (ADLs):  Patient states he does need assistance in some areas. After sitting for a while will need help getting up.                                                                       Any transportation issues/concerns Patient states he was advised upon discharge not to drive for 7-8 days:  Patient states normally he drives however, his daughter will be driving him to appointment.   Any patient concerns?.Patient feels he has been on  Prilosec for a while and feels ir it no longer works for him. Would like to try a defferent medication for stomach if possible.     Confirmed importance and date/time of follow-up visits scheduled: Yes,patient voiced understanding   Confirmed with patient if condition begins to worsen call PCP or go to the ER. Yes, patient agreed    Patient was given the [place] line [phone]: Yes

## 2015-04-15 NOTE — Telephone Encounter (Signed)
Left message for patient to return call regarding TCM information. Appointment has been scheduled for 01/17/15.

## 2015-04-16 NOTE — Telephone Encounter (Signed)
Pre visit call completed 

## 2015-04-17 ENCOUNTER — Encounter: Payer: Self-pay | Admitting: Internal Medicine

## 2015-04-17 ENCOUNTER — Ambulatory Visit (INDEPENDENT_AMBULATORY_CARE_PROVIDER_SITE_OTHER): Payer: Medicare Other | Admitting: Internal Medicine

## 2015-04-17 VITALS — BP 126/60 | HR 71 | Temp 97.7°F | Ht 65.0 in | Wt 131.4 lb

## 2015-04-17 DIAGNOSIS — F411 Generalized anxiety disorder: Secondary | ICD-10-CM | POA: Diagnosis not present

## 2015-04-17 DIAGNOSIS — D649 Anemia, unspecified: Secondary | ICD-10-CM

## 2015-04-17 DIAGNOSIS — N390 Urinary tract infection, site not specified: Secondary | ICD-10-CM | POA: Diagnosis not present

## 2015-04-17 DIAGNOSIS — I10 Essential (primary) hypertension: Secondary | ICD-10-CM | POA: Diagnosis not present

## 2015-04-17 DIAGNOSIS — A419 Sepsis, unspecified organism: Secondary | ICD-10-CM | POA: Diagnosis not present

## 2015-04-17 NOTE — Progress Notes (Signed)
Subjective:    Patient ID: [name], male    DOB: 08-04-1926, 82 y.o.   MRN: [id number]  DOS:  04/17/2015 Type of visit - description : Hospital follow-up, TCM 7, here with his daughter Interval history:  Admitted to the hospital 4-4- 17, discharged 3 days later Was admitted with fever, chills, hematuria for 3 days, was septic, diagnosed with UTI  (urine culture prior to admission showed Escherichia coli at the urologist office). Was treated with ciprofloxacin and improved. chronic medical problems were stable  Most recent labs: Sodium 134, low but was improving Creatinine 1.01 WBC upon admission 12.6, prior to discharge 10.4. Hemoglobin was 11.4, slightly low.  Review of Systems Since he left the hospital, he is back on his blood pressure medication. Good compliance with Cipro, has 2 more days to go. Is improving gradually, still feeling tired and not back to baseline. Appetite is improving No F or  chills No nausea, vomiting, diarrhea No dysuria, gross hematuria, difficulty urinating. Urine color is back to normal.  Past Medical History  Diagnosis Date  . Allergic rhinitis   . GERD (gastroesophageal reflux disease)     w/ esoph stricture  . Diabetes mellitus   . Hyperlipidemia   . Hypertension   . BPH (benign prostatic hypertrophy)     with weak urinary stream  . Osteoarthritis   . Stroke [facility])     CVA, per CT "old stroke"  . Internal hemorrhoids   . Anemia   . History of colonic polyps   . Polyneuropathy ([place])     NCS by neurology per pt 5/07  . Anxiety   . Hiatal hernia   . Inguinal hernia 2016    Dr. Georgette Dover, Pt does not want to proceed with repair at this time  . Cholecystoduodenal fistula 2016    Dr. Georgette Dover  . Elevated PSA   . Common bile duct stone   . Esophageal stricture     Past Surgical History  Procedure Laterality Date  . Total knee arthroplasty  05/2006    (R)  . Total knee arthroplasty  08/2006    (L)  . Hernia repair      bil inguinal- per  pt L in 90s, R in the 80s  . Cataract extraction Bilateral 01/2009  . Cardiovascular stress test  02-2010    done in [place], had CP, +EKG changes, neg nuclear  imagins  . Ercp w/ sphicterotomy      ????? Dr Deatra Ina  . Cholecystectomy N/A 08/16/2013    Procedure: LAPAROSCOPIC CONVERTED TO OPEN  CHOLECYSTECTOMY WITH INTRAOPERATIVE CHOLANGIOGRAM;  Surgeon: Imogene Burn. Georgette Dover, MD;  Location: [facility];  Service: General;  Laterality: N/A;    Social History   Social History  . Marital Status: Married    Spouse Name: N/A  . Number of Children: 2  . Years of Education: N/A   Occupational History  . retired    Social History Main Topics  . Smoking status: Former Research scientist (life sciences)  . Smokeless tobacco: Former Systems developer    Quit date: 01/05/1961  . Alcohol Use: No  . Drug Use: No  . Sexual Activity: Not on file   Other Topics Concern  . Not on file   Social History Narrative   Retired, Married, original from [place], lives w/ wife     has two children, lost a son , has a daughter              Medication List  This list is accurate as of: 04/17/15  7:04 PM.  Always use your most recent med list.               AMBULATORY NON FORMULARY MEDICATION  Medication Name: Abdominal Binder Dx: Ventral Hernia (K43.9)     amLODipine 5 MG tablet  Commonly known as:  NORVASC  Take 1 tablet (5 mg total) by mouth daily.     aspirin EC 81 MG tablet  Take 81 mg by mouth daily.     atenolol 50 MG tablet  Commonly known as:  TENORMIN  Take 0.5 tablets (25 mg total) by mouth daily.     CENTRUM SILVER tablet  Take 1 tablet by mouth daily.     ciprofloxacin 500 MG tablet  Commonly known as:  CIPRO  Take 1 tablet (500 mg total) by mouth 2 (two) times daily.     finasteride 5 MG tablet  Commonly known as:  PROSCAR  Take 1 tablet (5 mg total) by mouth daily.     ibuprofen 200 MG tablet  Commonly known as:  ADVIL,MOTRIN  Take 200 mg by mouth every 6 (six) hours as needed for moderate pain. Reported on  04/17/2015     omeprazole 20 MG capsule  Commonly known as:  PRILOSEC  Take 1 capsule (20 mg total) by mouth 2 (two) times daily before a meal.     simvastatin 10 MG tablet  Commonly known as:  ZOCOR  Take 1 tablet (10 mg total) by mouth at bedtime.     valsartan 160 MG tablet  Commonly known as:  DIOVAN  Take 1 tablet (160 mg total) by mouth daily.           Objective:   Physical Exam BP 126/60 mmHg  Pulse 71  Temp(Src) 97.7 F (36.5 C) (Oral)  Ht 5\' 5"  (1.651 m)  Wt 131 lb 6 oz (59.591 kg)  BMI 21.86 kg/m2  SpO2 97% General:   Well developed, well nourished . NAD.  HEENT:  Normocephalic . Face symmetric, atraumatic Lungs:  CTA B Normal respiratory effort, no intercostal retractions, no accessory muscle use. Heart: RRR,  no murmur.  no pretibial edema bilaterally  Abdomen:  Not distended, soft, non-tender. No rebound or rigidity. No CVA tenderness Skin: Not pale. Not jaundice Neurologic:  alert & oriented X3.  Speech normal, gait appropriate for age and unassisted Psych--  Cognition and judgment appear intact.  Cooperative with normal attention span and concentration.  Behavior appropriate. No anxious or depressed appearing.    Assessment & Plan:   Assessment > DM Polyneuropathy by NCS, saw neurology 2007, asx  HTN Hyperlipidemia Anxiety DJD: See surgeries GERD with esophageal stricture, HH H/o anemia  BPH, increase PSA H/o Stroke ("old stroke" per CT) H/o  hernia, 2016, declined repair Admitted 04/2015: UTI, sepsis, Escherichia coli  PLAN UTI, sepsis: Responding well to  Cipro, to call if he has fever, chills, nausea or other symptoms. HTN: bp was low at the hospital per pt, back on his normal BP meds, BP today is very good, last BMP satisfactory Anxiety: Self discontinue Xanax, make him too drowsy Anemia: Last iron test December 2016 normal, last hemoglobin slightly low but stable. RTC 2- 3 weeks for a checkup.

## 2015-04-17 NOTE — Patient Instructions (Signed)
  GO TO THE FRONT DESK Schedule your next appointment for a  Follow up in 2 or 3 weeks Front desk: ok to put two 15-min appointments together

## 2015-04-17 NOTE — Progress Notes (Signed)
Pre visit review using our clinic review tool, if applicable. No additional management support is needed unless otherwise documented below in the visit note. 

## 2015-04-17 NOTE — Assessment & Plan Note (Signed)
UTI, sepsis: Responding well to  Cipro, to call if he has fever, chills, nausea or other symptoms. HTN: Back on his normal BP meds, BP today is very good, last BMP satisfactory Anxiety: Self discontinue Xanax, make him too drowsy Anemia: Last iron test December 2016 normal, last hemoglobin slightly low but stable. RTC 2- 3 weeks for a checkup.

## 2015-04-23 NOTE — Telephone Encounter (Signed)
Pt will need to call [facility] directly to discuss why he hasn't received the shipment.

## 2015-04-23 NOTE — Telephone Encounter (Signed)
Pt called in because he hasnt received his refill on his medications. Pt says that he need a 3 month supply.    Pharmacy: [facility], [address] [place] [address]

## 2015-04-26 DIAGNOSIS — N401 Enlarged prostate with lower urinary tract symptoms: Secondary | ICD-10-CM | POA: Diagnosis not present

## 2015-04-26 DIAGNOSIS — Z0000 Encounter for general adult medical examination without abnormal findings: Secondary | ICD-10-CM | POA: Diagnosis not present

## 2015-04-26 DIAGNOSIS — N390 Urinary tract infection, site not specified: Secondary | ICD-10-CM | POA: Diagnosis not present

## 2015-04-26 DIAGNOSIS — N138 Other obstructive and reflux uropathy: Secondary | ICD-10-CM | POA: Diagnosis not present

## 2015-05-07 ENCOUNTER — Ambulatory Visit: Payer: Medicare Other | Admitting: Internal Medicine

## 2015-08-15 ENCOUNTER — Other Ambulatory Visit: Payer: Self-pay | Admitting: Internal Medicine

## 2015-09-17 ENCOUNTER — Other Ambulatory Visit: Payer: Self-pay | Admitting: Internal Medicine

## 2015-09-18 ENCOUNTER — Other Ambulatory Visit: Payer: Self-pay

## 2015-09-18 MED ORDER — ATENOLOL 50 MG PO TABS: 25.0000 mg | ORAL_TABLET | Freq: Every day | ORAL | 1 refills | Status: DC

## 2015-09-26 DIAGNOSIS — Z125 Encounter for screening for malignant neoplasm of prostate: Secondary | ICD-10-CM | POA: Diagnosis not present

## 2015-09-26 DIAGNOSIS — R972 Elevated prostate specific antigen [PSA]: Secondary | ICD-10-CM | POA: Diagnosis not present

## 2015-09-30 ENCOUNTER — Ambulatory Visit (INDEPENDENT_AMBULATORY_CARE_PROVIDER_SITE_OTHER): Payer: Medicare Other | Admitting: Internal Medicine

## 2015-09-30 ENCOUNTER — Encounter: Payer: Self-pay | Admitting: Internal Medicine

## 2015-09-30 VITALS — BP 142/60 | HR 58 | Temp 98.2°F | Resp 14 | Ht 65.0 in | Wt 132.0 lb

## 2015-09-30 DIAGNOSIS — Z23 Encounter for immunization: Secondary | ICD-10-CM

## 2015-09-30 DIAGNOSIS — I10 Essential (primary) hypertension: Secondary | ICD-10-CM | POA: Diagnosis not present

## 2015-09-30 DIAGNOSIS — R946 Abnormal results of thyroid function studies: Secondary | ICD-10-CM

## 2015-09-30 DIAGNOSIS — G3184 Mild cognitive impairment, so stated: Secondary | ICD-10-CM

## 2015-09-30 DIAGNOSIS — F411 Generalized anxiety disorder: Secondary | ICD-10-CM

## 2015-09-30 DIAGNOSIS — E119 Type 2 diabetes mellitus without complications: Secondary | ICD-10-CM | POA: Diagnosis not present

## 2015-09-30 DIAGNOSIS — E785 Hyperlipidemia, unspecified: Secondary | ICD-10-CM

## 2015-09-30 DIAGNOSIS — Z0000 Encounter for general adult medical examination without abnormal findings: Secondary | ICD-10-CM

## 2015-09-30 DIAGNOSIS — R7989 Other specified abnormal findings of blood chemistry: Secondary | ICD-10-CM

## 2015-09-30 LAB — TSH: TSH: 4.37 u[IU]/mL (ref 0.35–4.50)

## 2015-09-30 LAB — BASIC METABOLIC PANEL
BUN: 13 mg/dL (ref 6–23)
CO2: 27 mEq/L (ref 19–32)
Calcium: 9 mg/dL (ref 8.4–10.5)
Chloride: 102 mEq/L (ref 96–112)
Creatinine, Ser: 1.06 mg/dL (ref 0.40–1.50)
GFR: 70.2 mL/min (ref >60.00)
Glucose, Bld: 113 mg/dL — ABNORMAL HIGH (ref 70–99)
Potassium: 3.9 mEq/L (ref 3.5–5.1)
Sodium: 137 mEq/L (ref 135–145)

## 2015-09-30 LAB — CBC WITH DIFFERENTIAL/PLATELET
Basophils Absolute: 0.1 10*3/uL (ref 0.0–0.1)
Basophils Relative: 0.7 % (ref 0.0–3.0)
Eosinophils Absolute: 0.5 10*3/uL (ref 0.0–0.7)
Eosinophils Relative: 6.1 % — ABNORMAL HIGH (ref 0.0–5.0)
HCT: 38 % — ABNORMAL LOW (ref 39.0–52.0)
Hemoglobin: 13.1 g/dL (ref 13.0–17.0)
Lymphocytes Relative: 30.2 % (ref 12.0–46.0)
Lymphs Abs: 2.5 10*3/uL (ref 0.7–4.0)
MCHC: 34.5 g/dL (ref 30.0–36.0)
MCV: 87.1 fl (ref 78.0–100.0)
Monocytes Absolute: 0.9 10*3/uL (ref 0.1–1.0)
Monocytes Relative: 10.9 % (ref 3.0–12.0)
Neutro Abs: 4.2 10*3/uL (ref 1.4–7.7)
Neutrophils Relative %: 52.1 % (ref 43.0–77.0)
Platelets: 197 10*3/uL (ref 150.0–400.0)
RBC: 4.37 Mil/uL (ref 4.22–5.81)
RDW: 14.8 % (ref 11.5–15.5)
WBC: 8.1 10*3/uL (ref 4.0–10.5)

## 2015-09-30 LAB — HEMOGLOBIN A1C: Hgb A1c MFr Bld: 6 % (ref 4.6–6.5)

## 2015-09-30 MED ORDER — ESCITALOPRAM OXALATE 5 MG PO TABS: 5.0000 mg | ORAL_TABLET | Freq: Every day | ORAL | 1 refills | Status: DC

## 2015-09-30 MED FILL — ESCITALOPRAM 5 MG TABLET: 5 | 30 days supply | Qty: 30 | Fill #0

## 2015-09-30 NOTE — Assessment & Plan Note (Signed)
DM: Diet control, check A1c HTN: Continue Tenormin, amlodipine and Diovan. Check a BMP and CBC Hyperlipidemia: Well-controlled per last FLP, last liver tests normal. Continue simvastatin  Anxiety: Continue to be an issue, request a medication, trial with Lexapro 5 mg. RTC 6 weeks. Watch for somnolence Mild cognitive impairment: Has some memory issues, reassess after anxiety treatment. Slightly elevated TSH before, recheck today Mild allergies: Recommend Flonase. RTC 6 weeks

## 2015-09-30 NOTE — Assessment & Plan Note (Addendum)
Td 3-12 ; Pneumovax 2006 ; prevnar 2015; shingles shot Rx was  Provided before but never used  flu shot today  Colon cancer screening: no longer indicated  bone Density: 10/21/2005 and  11-09, both normal- neg ; rec ca and vit D  Prostate cancer screening: per urology Counseling: diet discussed , encouraged to saty active

## 2015-09-30 NOTE — Patient Instructions (Signed)
Get your blood work before you leave   Consider a  healthcare power of attorney  Lexapro 5 mg: One tablet daily  Please come back in 6 weeks      Fall Prevention and Home Safety Falls cause injuries and can affect all age groups. It is possible to use preventive measures to significantly decrease the likelihood of falls. There are many simple measures which can make your home safer and prevent falls. OUTDOORS  Repair cracks and edges of walkways and driveways.  Remove high doorway thresholds.  Trim shrubbery on the main path into your home.  Have good outside lighting.  Clear walkways of tools, rocks, debris, and clutter.  Check that handrails are not broken and are securely fastened. Both sides of steps should have handrails.  Have leaves, snow, and ice cleared regularly.  Use sand or salt on walkways during winter months.  In the garage, clean up grease or oil spills. BATHROOM  Install night lights.  Install grab bars by the toilet and in the tub and shower.  Use non-skid mats or decals in the tub or shower.  Place a plastic non-slip stool in the shower to sit on, if needed.  Keep floors dry and clean up all water on the floor immediately.  Remove soap buildup in the tub or shower on a regular basis.  Secure bath mats with non-slip, double-sided rug tape.  Remove throw rugs and tripping hazards from the floors. BEDROOMS  Install night lights.  Make sure a bedside light is easy to reach.  Do not use oversized bedding.  Keep a telephone by your bedside.  Have a firm chair with side arms to use for getting dressed.  Remove throw rugs and tripping hazards from the floor. KITCHEN  Keep handles on pots and pans turned toward the center of the stove. Use back burners when possible.  Clean up spills quickly and allow time for drying.  Avoid walking on wet floors.  Avoid hot utensils and knives.  Position shelves so they are not too high or  low.  Place commonly used objects within easy reach.  If necessary, use a sturdy step stool with a grab bar when reaching.  Keep electrical cables out of the way.  Do not use floor polish or wax that makes floors slippery. If you must use wax, use non-skid floor wax.  Remove throw rugs and tripping hazards from the floor. STAIRWAYS  Never leave objects on stairs.  Place handrails on both sides of stairways and use them. Fix any loose handrails. Make sure handrails on both sides of the stairways are as long as the stairs.  Check carpeting to make sure it is firmly attached along stairs. Make repairs to worn or loose carpet promptly.  Avoid placing throw rugs at the top or bottom of stairways, or properly secure the rug with carpet tape to prevent slippage. Get rid of throw rugs, if possible.  Have an electrician put in a light switch at the top and bottom of the stairs. OTHER FALL PREVENTION TIPS  Wear low-heel or rubber-soled shoes that are supportive and fit well. Wear closed toe shoes.  When using a stepladder, make sure it is fully opened and both spreaders are firmly locked. Do not climb a closed stepladder.  Add color or contrast paint or tape to grab bars and handrails in your home. Place contrasting color strips on first and last steps.  Learn and use mobility aids as needed. Install an Dealer emergency  response system.  Turn on lights to avoid dark areas. Replace light bulbs that burn out immediately. Get light switches that glow.  Arrange furniture to create clear pathways. Keep furniture in the same place.  Firmly attach carpet with non-skid or double-sided tape.  Eliminate uneven floor surfaces.  Select a carpet pattern that does not visually hide the edge of steps.  Be aware of all pets. OTHER HOME SAFETY TIPS  Set the water temperature for 120 F (48.8 C).  Keep emergency numbers on or near the telephone.  Keep smoke detectors on every level of the  home and near sleeping areas. Document Released: 12/12/2001 Document Revised: 06/23/2011 Document Reviewed: 03/13/2011 [facility] Patient Information 2015 [place], [place]. This information is not intended to replace advice given to you by your health care provider. Make sure you discuss any questions you have with your health care provider.   Preventive Care for Adults Ages 4 and over  Blood pressure check.** / Every 1 to 2 years.  Lipid and cholesterol check.**/ Every 5 years beginning at age 58.  Lung cancer screening. / Every year if you are aged 34-80 years and have a 30-pack-year history of smoking and currently smoke or have quit within the past 15 years. Yearly screening is stopped once you have quit smoking for at least 15 years or develop a health problem that would prevent you from having lung cancer treatment.  Fecal occult blood test (FOBT) of stool. / Every year beginning at age 66 and continuing until age 72. You may not have to do this test if you get a colonoscopy every 10 years.  Flexible sigmoidoscopy** or colonoscopy.** / Every 5 years for a flexible sigmoidoscopy or every 10 years for a colonoscopy beginning at age 11 and continuing until age 74.  Hepatitis C blood test.** / For all people born from [birth year] through 1963 and any individual with known risks for hepatitis C.  Abdominal aortic aneurysm (AAA) screening.** / A one-time screening for ages 83 to 11 years who are current or former smokers.  Skin self-exam. / Monthly.  Influenza vaccine. / Every year.  Tetanus, diphtheria, and acellular pertussis (Tdap/Td) vaccine.** / 1 dose of Td every 10 years.  Varicella vaccine.** / Consult your health care provider.  Zoster vaccine.** / 1 dose for adults aged 59 years or older.  Pneumococcal 13-valent conjugate (PCV13) vaccine.** / Consult your health care provider.  Pneumococcal polysaccharide (PPSV23) vaccine.** / 1 dose for all adults aged 30 years and  older.  Meningococcal vaccine.** / Consult your health care provider.  Hepatitis A vaccine.** / Consult your health care provider.  Hepatitis B vaccine.** / Consult your health care provider.  Haemophilus influenzae type b (Hib) vaccine.** / Consult your health care provider. **Family history and personal history of risk and conditions may change your health care provider's recommendations. Document Released: 02/17/2001 Document Revised: 12/27/2012 Document Reviewed: 05/19/2010 [facility] Patient Information 2015 [place], [place]. This information is not intended to replace advice given to you by your health care provider. Make sure you discuss any questions you have with your health care provider.

## 2015-09-30 NOTE — Progress Notes (Signed)
Subjective:    Patient ID: [name], male    DOB: [birth year], 82 y.o.   MRN: [id number]  DOS:  09/30/2015 Type of visit - description : CPX, here with his wife Interval history:  HTN: Good medication compliance, ambulatory BPs always within normal High cholesterol: Good medication compliance without apparent side effects 2 weeks history of dry cough, throat congestion, sneezing. No fever, chills, chest congestion, chest pain or difficulty breathing. Also, wife has noted him to be more forgetful, usually is able to remember things but is taking much longer. Denies any wandering, aggressive behavior or serious problems such as getting lost. Anxiety continue to be an issue. No depression. He simply gets really scared about being by himself   Review of Systems  Constitutional: No fever. No chills. No unexplained wt changes. No unusual sweats  HEENT: No dental problems, no ear discharge, no facial swelling, no voice changes. No eye discharge, no eye  redness , no  intolerance to light   Respiratory: No wheezing , no  difficulty breathing.    Cardiovascular: No CP, no leg swelling , no  Palpitations  GI: no nausea, no vomiting, no diarrhea , no  abdominal pain.  No blood in the stools. No dysphagia, no odynophagia    Endocrine: No polyphagia, no polyuria , no polydipsia  GU: No dysuria, gross hematuria, difficulty urinating. No urinary urgency, no frequency.  Musculoskeletal: No joint swellings or unusual aches or pains  Skin: No change in the color of the skin, palor , no  Rash  Allergic, immunologic: No   food allergies  Neurological: No dizziness no  syncope. No headaches. No diplopia, no slurred, no slurred speech, no motor deficits, no facial  Numbness  Hematological: No enlarged lymph nodes, no easy bruising , no unusual bleedings  Psychiatry: No suicidal ideas, no hallucinations, no beavior problems, no confusion.    Past Medical History:  Diagnosis Date  .  Allergic rhinitis   . Anemia   . Anxiety   . BPH (benign prostatic hypertrophy)    with weak urinary stream  . Cholecystoduodenal fistula 2016   Dr. Georgette Dover  . Common bile duct stone   . Diabetes mellitus   . Elevated PSA   . Esophageal stricture   . GERD (gastroesophageal reflux disease)    w/ esoph stricture  . Hiatal hernia   . History of colonic polyps   . Hyperlipidemia   . Hypertension   . Inguinal hernia 2016   Dr. Georgette Dover, Pt does not want to proceed with repair at this time  . Internal hemorrhoids   . Osteoarthritis   . Polyneuropathy ([place])    NCS by neurology per pt 5/07  . Stroke [facility])    CVA, per CT "old stroke"    Past Surgical History:  Procedure Laterality Date  . CARDIOVASCULAR STRESS TEST  02-2010   done in [place], had CP, +EKG changes, neg nuclear  imagins  . CATARACT EXTRACTION Bilateral 01/2009  . CHOLECYSTECTOMY N/A 08/16/2013   Procedure: LAPAROSCOPIC CONVERTED TO OPEN  CHOLECYSTECTOMY WITH INTRAOPERATIVE CHOLANGIOGRAM;  Surgeon: Imogene Burn. Georgette Dover, MD;  Location: [facility];  Service: General;  Laterality: N/A;  . ERCP W/ SPHICTEROTOMY     ????? Dr Deatra Ina  . HERNIA REPAIR     bil inguinal- per pt L in 90s, R in the 80s  . TOTAL KNEE ARTHROPLASTY  05/2006   (R)  . TOTAL KNEE ARTHROPLASTY  08/2006   (L)    Social History  Social History  . Marital status: Married    Spouse name: N/A  . Number of children: 2  . Years of education: N/A   Occupational History  . retired    Social History Main Topics  . Smoking status: Former Research scientist (life sciences)  . Smokeless tobacco: Former Systems developer    Quit date: 01/05/1961  . Alcohol use No  . Drug use: No  . Sexual activity: Not on file   Other Topics Concern  . Not on file   Social History Narrative   Retired, Married, original from [place], lives w/ wife     has two children, lost a son , has a daughter in [place]          Family History  Problem Relation Age of Onset  . Diabetes Brother   . Heart attack Brother 51  .  Liver cancer Brother   . Colon cancer Neg Hx   . Prostate cancer Neg Hx       Medication List       Accurate as of 09/30/15  2:53 PM. Always use your most recent med list.          AMBULATORY NON FORMULARY MEDICATION Medication Name: Abdominal Binder Dx: Ventral Hernia (K43.9)   amLODipine 5 MG tablet Commonly known as:  NORVASC Take 1 tablet (5 mg total) by mouth daily.   aspirin EC 81 MG tablet Take 81 mg by mouth daily.   atenolol 50 MG tablet Commonly known as:  TENORMIN Take 0.5 tablets (25 mg total) by mouth daily.   CENTRUM SILVER tablet Take 1 tablet by mouth daily.   escitalopram 5 MG tablet Commonly known as:  LEXAPRO Take 1 tablet (5 mg total) by mouth daily.   finasteride 5 MG tablet Commonly known as:  PROSCAR Take 1 tablet (5 mg total) by mouth daily.   omeprazole 20 MG capsule Commonly known as:  PRILOSEC Take 1 capsule (20 mg total) by mouth 2 (two) times daily before a meal.   simvastatin 10 MG tablet Commonly known as:  ZOCOR Take 1 tablet (10 mg total) by mouth at bedtime.   valsartan 160 MG tablet Commonly known as:  DIOVAN Take 1 tablet (160 mg total) by mouth daily.          Objective:   Physical Exam BP (!) 142/60 (BP Location: Left Arm, Patient Position: Sitting, Cuff Size: Small)   Pulse (!) 58   Temp 98.2 F (36.8 C) (Oral)   Resp 14   Ht 5\' 5"  (1.651 m)   Wt 132 lb (59.9 kg)   SpO2 96%   BMI 21.97 kg/m   General:   Well developed, well nourished . NAD.  Neck: No  thyromegaly  HEENT:  Normocephalic . Face symmetric, atraumatic. Nose is slightly congested, sinuses no TTP. TMs normal. Throat symmetric. Lungs:  CTA B Normal respiratory effort, no intercostal retractions, no accessory muscle use. Heart: RRR,  no murmur.  No pretibial edema bilaterally  Abdomen:  Not distended, soft, non-tender. No rebound or rigidity.   Skin: Exposed areas without rash. Not pale. Not jaundice Neurologic:  alert & oriented X3 (I did  notice it took time for him to remember the date and day).  Speech normal, gait appropriate for age and unassisted Strength symmetric and appropriate for age.  Psych: Cognition and judgment appear intact.  Cooperative with normal attention span and concentration.  Behavior appropriate. No anxious or depressed appearing.    Assessment & Plan:   Assessment  DM Polyneuropathy by NCS, saw neurology 2007, asx  HTN Hyperlipidemia Anxiety DJD: See surgeries GERD with esophageal stricture, HH H/o anemia  BPH, increase PSA, sees urology H/o Stroke ("old stroke" per CT) H/o  hernia, 2016, declined repair Admitted 04/2015: UTI, sepsis, Escherichia coli  PLAN DM: Diet control, check A1c HTN: Continue Tenormin, amlodipine and Diovan. Check a BMP and CBC Hyperlipidemia: Well-controlled per last FLP, last liver tests normal. Continue simvastatin  Anxiety: Continue to be an issue, request a medication, trial with Lexapro 5 mg. RTC 6 weeks. Watch for somnolence Mild cognitive impairment: Has some memory issues, reassess after anxiety treatment. Slightly elevated TSH before, recheck today Mild allergies: Recommend Flonase. RTC 6 weeks

## 2015-09-30 NOTE — Progress Notes (Signed)
Pre visit review using our clinic review tool, if applicable. No additional management support is needed unless otherwise documented below in the visit note. 

## 2015-10-03 DIAGNOSIS — R350 Frequency of micturition: Secondary | ICD-10-CM | POA: Diagnosis not present

## 2015-10-03 DIAGNOSIS — N401 Enlarged prostate with lower urinary tract symptoms: Secondary | ICD-10-CM | POA: Diagnosis not present

## 2015-10-03 DIAGNOSIS — R3912 Poor urinary stream: Secondary | ICD-10-CM | POA: Diagnosis not present

## 2015-10-03 DIAGNOSIS — R972 Elevated prostate specific antigen [PSA]: Secondary | ICD-10-CM | POA: Diagnosis not present

## 2015-10-07 MED FILL — ALFUZOSIN HCL ER 10 MG TAB: 10 | 30 days supply | Qty: 30 | Fill #0

## 2015-10-08 ENCOUNTER — Encounter: Payer: Self-pay | Admitting: Internal Medicine

## 2015-10-09 MED FILL — CEPHALEXIN 500 MG CAPSULE: 500 | 7 days supply | Qty: 14 | Fill #0

## 2015-10-14 DIAGNOSIS — K432 Incisional hernia without obstruction or gangrene: Secondary | ICD-10-CM | POA: Diagnosis not present

## 2015-10-14 DIAGNOSIS — K823 Fistula of gallbladder: Secondary | ICD-10-CM | POA: Diagnosis not present

## 2015-10-15 ENCOUNTER — Encounter: Payer: Self-pay | Admitting: Podiatry

## 2015-10-15 ENCOUNTER — Ambulatory Visit (INDEPENDENT_AMBULATORY_CARE_PROVIDER_SITE_OTHER): Payer: Medicare Other | Admitting: Podiatry

## 2015-10-15 DIAGNOSIS — E1142 Type 2 diabetes mellitus with diabetic polyneuropathy: Secondary | ICD-10-CM

## 2015-10-15 DIAGNOSIS — B351 Tinea unguium: Secondary | ICD-10-CM

## 2015-10-15 DIAGNOSIS — Q828 Other specified congenital malformations of skin: Secondary | ICD-10-CM

## 2015-10-15 DIAGNOSIS — M79676 Pain in unspecified toe(s): Secondary | ICD-10-CM

## 2015-10-16 NOTE — Progress Notes (Signed)
He presents today with a chief complaint of painful calluses to the plantar medial aspect of the first metatarsophalangeal joint and the plantar forefoot bilaterally.  Objective: Vital signs are stable he is alert and oriented 3. Pulses are palpable. Neurologic sensorium is intact. Deep tendon reflexes are intact. Muscle strength +5 over 5 dorsiflexion plantar flexors and inverters everters. Reactive hyperkeratosis is noted to the plantar medial aspect of the first metatarsophalangeal joint as well as the plantar aspect of the lesser metatarsals bilaterally. No open lesions or wounds are noted.  Assessment: Porokeratosis bilateral.  Plan: Debridement of all reactive hyperkeratoses bilaterally. Follow-up with them on an as-needed basis.

## 2015-10-31 MED FILL — ESCITALOPRAM 5 MG TABLET: 5 | 30 days supply | Qty: 30 | Fill #1

## 2015-11-10 ENCOUNTER — Other Ambulatory Visit: Payer: Self-pay | Admitting: Internal Medicine

## 2015-11-21 DIAGNOSIS — R490 Dysphonia: Secondary | ICD-10-CM | POA: Diagnosis not present

## 2015-11-21 DIAGNOSIS — J310 Chronic rhinitis: Secondary | ICD-10-CM | POA: Diagnosis not present

## 2015-11-22 MED FILL — FLUTICASONE PROP 50 MCG SPR: 50 | 30 days supply | Qty: 16 | Fill #0

## 2015-12-04 ENCOUNTER — Other Ambulatory Visit: Payer: Self-pay | Admitting: Internal Medicine

## 2015-12-04 MED FILL — ESCITALOPRAM 5 MG TABLET: 5 | 30 days supply | Qty: 30 | Fill #0

## 2015-12-17 DIAGNOSIS — Z961 Presence of intraocular lens: Secondary | ICD-10-CM | POA: Diagnosis not present

## 2015-12-17 DIAGNOSIS — H10413 Chronic giant papillary conjunctivitis, bilateral: Secondary | ICD-10-CM | POA: Diagnosis not present

## 2015-12-17 DIAGNOSIS — H43391 Other vitreous opacities, right eye: Secondary | ICD-10-CM | POA: Diagnosis not present

## 2015-12-29 ENCOUNTER — Other Ambulatory Visit: Payer: Self-pay | Admitting: Internal Medicine

## 2016-01-01 ENCOUNTER — Other Ambulatory Visit: Payer: Self-pay | Admitting: Internal Medicine

## 2016-01-07 ENCOUNTER — Telehealth: Payer: Self-pay | Admitting: Internal Medicine

## 2016-01-07 ENCOUNTER — Ambulatory Visit: Payer: Medicare Other | Admitting: Internal Medicine

## 2016-01-07 NOTE — Telephone Encounter (Signed)
No, thx 

## 2016-01-07 NOTE — Telephone Encounter (Signed)
Pt lvm at 8:04 cancelling his appt. Pt says that it is cold and he is unable to make his appt. Pt will call back to reschedule.   Should pt be charged?

## 2016-01-08 ENCOUNTER — Encounter: Payer: Self-pay | Admitting: Internal Medicine

## 2016-01-08 ENCOUNTER — Telehealth: Payer: Self-pay

## 2016-01-08 ENCOUNTER — Ambulatory Visit (INDEPENDENT_AMBULATORY_CARE_PROVIDER_SITE_OTHER): Payer: Medicare Other | Admitting: Internal Medicine

## 2016-01-08 VITALS — BP 128/68 | HR 68 | Temp 98.2°F | Resp 14 | Ht 65.0 in | Wt 130.1 lb

## 2016-01-08 DIAGNOSIS — I10 Essential (primary) hypertension: Secondary | ICD-10-CM | POA: Diagnosis not present

## 2016-01-08 DIAGNOSIS — F411 Generalized anxiety disorder: Secondary | ICD-10-CM

## 2016-01-08 DIAGNOSIS — Z7184 Encounter for health counseling related to travel: Secondary | ICD-10-CM

## 2016-01-08 MED ORDER — ESCITALOPRAM OXALATE 10 MG PO TABS: 10.0000 mg | ORAL_TABLET | Freq: Every day | ORAL | 1 refills | Status: DC

## 2016-01-08 MED ORDER — ESCITALOPRAM OXALATE 10 MG PO TABS: 10.0000 mg | ORAL_TABLET | Freq: Every day | ORAL | 5 refills | Status: DC

## 2016-01-08 MED FILL — ESCITALOPRAM 10 MG TABLET: 10 | 90 days supply | Qty: 90 | Fill #0

## 2016-01-08 MED FILL — FLUTICASONE PROP 50 MCG SPR: 50 | 30 days supply | Qty: 16 | Fill #1

## 2016-01-08 NOTE — Progress Notes (Signed)
Subjective:    Patient ID: [name], male    DOB: [birth year], 83 y.o.   MRN: [id number]  DOS:  01/08/2016 Type of visit - description : rov Interval history: Anxiety follow-up: Started Lexapro, no apparent side effects, anxiety decreased but not 100%, adjust meds   Review of Systems No suicidal ideas Sleeping better, no excessively sedated  Past Medical History:  Diagnosis Date  . Allergic rhinitis   . Anemia   . Anxiety   . BPH (benign prostatic hypertrophy)    with weak urinary stream  . Cholecystoduodenal fistula 2016   Dr. Georgette Dover  . Common bile duct stone   . Diabetes mellitus   . Elevated PSA   . Esophageal stricture   . GERD (gastroesophageal reflux disease)    w/ esoph stricture  . Hiatal hernia   . History of colonic polyps   . Hyperlipidemia   . Hypertension   . Inguinal hernia 2016   Dr. Georgette Dover, Pt does not want to proceed with repair at this time  . Internal hemorrhoids   . Osteoarthritis   . Polyneuropathy ([place])    NCS by neurology per pt 5/07  . Stroke [facility])    CVA, per CT "old stroke"    Past Surgical History:  Procedure Laterality Date  . CARDIOVASCULAR STRESS TEST  02-2010   done in [place], had CP, +EKG changes, neg nuclear  imagins  . CATARACT EXTRACTION Bilateral 01/2009  . CHOLECYSTECTOMY N/A 08/16/2013   Procedure: LAPAROSCOPIC CONVERTED TO OPEN  CHOLECYSTECTOMY WITH INTRAOPERATIVE CHOLANGIOGRAM;  Surgeon: Imogene Burn. Georgette Dover, MD;  Location: [facility];  Service: General;  Laterality: N/A;  . ERCP W/ SPHICTEROTOMY     ????? Dr Deatra Ina  . HERNIA REPAIR     bil inguinal- per pt L in 90s, R in the 80s  . TOTAL KNEE ARTHROPLASTY Right 05/2006  . TOTAL KNEE ARTHROPLASTY Left 08/2006    Social History   Social History  . Marital status: Married    Spouse name: N/A  . Number of children: 2  . Years of education: N/A   Occupational History  . retired    Social History Main Topics  . Smoking status: Former Research scientist (life sciences)  . Smokeless tobacco: Former Systems developer     Quit date: 01/05/1961  . Alcohol use No  . Drug use: No  . Sexual activity: Not on file   Other Topics Concern  . Not on file   Social History Narrative   Retired, Married, original from [place], lives w/ wife     has two children, lost a son , has a daughter in [place]           Allergies as of 01/08/2016      Reactions   Amoxicillin Other (See Comments)   "loose motions" "giddiness" Has patient had a PCN reaction causing immediate rash, facial/tongue/throat swelling, SOB or lightheadedness with hypotension: No Has patient had a PCN reaction causing severe rash involving mucus membranes or skin necrosis: No Has patient had a PCN reaction that required hospitalization No Has patient had a PCN reaction occurring within the last 10 years: No If all of the above answers are "NO", then may proceed with Cephalosporin use.      Medication List       Accurate as of 01/08/16 11:59 PM. Always use your most recent med list.          AMBULATORY NON FORMULARY MEDICATION Medication Name: Abdominal Binder Dx: Ventral Hernia (K43.9)  amLODipine 5 MG tablet Commonly known as:  NORVASC Take 1 tablet (5 mg total) by mouth daily.   aspirin EC 81 MG tablet Take 81 mg by mouth daily.   atenolol 50 MG tablet Commonly known as:  TENORMIN Take 0.5 tablets (25 mg total) by mouth daily.   CENTRUM SILVER tablet Take 1 tablet by mouth daily.   escitalopram 10 MG tablet Commonly known as:  LEXAPRO Take 1 tablet (10 mg total) by mouth daily.   finasteride 5 MG tablet Commonly known as:  PROSCAR Take 1 tablet (5 mg total) by mouth daily.   omeprazole 20 MG capsule Commonly known as:  PRILOSEC Take 1 capsule (20 mg total) by mouth 2 (two) times daily before a meal.   simvastatin 10 MG tablet Commonly known as:  ZOCOR Take 1 tablet (10 mg total) by mouth at bedtime.   valsartan 160 MG tablet Commonly known as:  DIOVAN Take 1 tablet (160 mg total) by mouth daily.            Objective:   Physical Exam BP 128/68 (BP Location: Left Arm, Patient Position: Sitting, Cuff Size: Small)   Pulse 68   Temp 98.2 F (36.8 C) (Oral)   Resp 14   Ht 5\' 5"  (1.651 m)   Wt 130 lb 2 oz (59 kg)   SpO2 98%   BMI 21.65 kg/m  General:   Well developed, well nourished . NAD.  HEENT:  Normocephalic . Face symmetric, atraumatic Skin: Not pale. Not jaundice Neurologic:  alert & oriented X3.  Speech normal, gait appropriate for age and unassisted Psych--  Cognition and judgment appear intact.  Cooperative with normal attention span and concentration.  Behavior appropriate. No anxious or depressed appearing.      Assessment & Plan:   Assessment  DM Polyneuropathy by NCS, saw neurology 2007, asx  HTN Hyperlipidemia Anxiety DJD: See surgeries GERD with esophageal stricture, HH H/o anemia  BPH, increase PSA, sees urology H/o Stroke ("old stroke" per CT) H/o  hernia, 2016, declined repair Admitted 04/2015: UTI, sepsis, Escherichia coli  PLAN Anxiety: Doing better with Lexapro 5 mg, room for improvement, we discussed pros and cons of increased dose, we agreed to go up to Lexapro 10 mg, try for few weeks, if he prefers to 5 mg dose okay to cut it in half. Also, needs travel advise, going to [place]. I will need more time to review extensive information from the [facility] website. Will refer to the travel clinic. If they can see him in a timely manner, he will come back here for 30 minutes exclusively for travel advice  (adden-- we did get him an appointment for next week) HTN: Needs a refill on losartan RTC 4 months

## 2016-01-08 NOTE — Patient Instructions (Addendum)
Increase Lexapro to 10 mg one tablet daily.  Okay to decrease to half tablet in few weeks if you prefer   Next visit in 4 months, please make an appointment  We are referring you to the travel clinic, if they can't see you in a timely manner, please come back at a later time to exclusively discuss your future travel

## 2016-01-08 NOTE — Progress Notes (Signed)
Pre visit review using our clinic review tool, if applicable. No additional management support is needed unless otherwise documented below in the visit note. 

## 2016-01-08 NOTE — Telephone Encounter (Signed)
Pt traveling to [place] on 02/06/2016. Needing travel advice. Called Travel [phone]. Appt scheduled for 01/14/2016 at 11:45 AM at [place] is [employer]. [address]. Pt will need to bring current copy of immunization record as well as $65 co-pay (they do not bill insurance), Pt will also be responsible for any cost on immunizations. LMOM at Pt's home number w/ above information. Instructed Pt to call travel clinic or our office if questions.

## 2016-01-09 NOTE — Assessment & Plan Note (Addendum)
Anxiety: Doing better with Lexapro 5 mg, room for improvement, we discussed pros and cons of increased dose, we agreed to go up to Lexapro 10 mg, try for few weeks, if he prefers to 5 mg dose okay to cut it in half. Also, needs travel advise, going to [place]. I will need more time to review extensive information from the [facility] website. Will refer to the travel clinic. If they can see him in a timely manner, he will come back here for 30 minutes exclusively for travel advice  (adden-- we did get him an appointment for next week) HTN: Needs a refill on losartan RTC 4 months

## 2016-01-09 NOTE — Telephone Encounter (Signed)
Patient called stating that he cannot afford to go to the travel clinic because they do not bill insurance. Please advise.   Phone: [phone]

## 2016-01-13 NOTE — Telephone Encounter (Signed)
Patient calling back to follow up on request. Plse adv

## 2016-01-13 NOTE — Telephone Encounter (Signed)
Pt will need 30 minute appt w/ PCP for travel advice.

## 2016-01-24 IMAGING — CR DG CHEST 2V
2 series · 2 of 2 positions shown · non-contrast
Comparison: 11/07/2012

CLINICAL DATA: Previous left basilar infiltrate

EXAM:
CHEST  2 VIEW

[w chest pa]
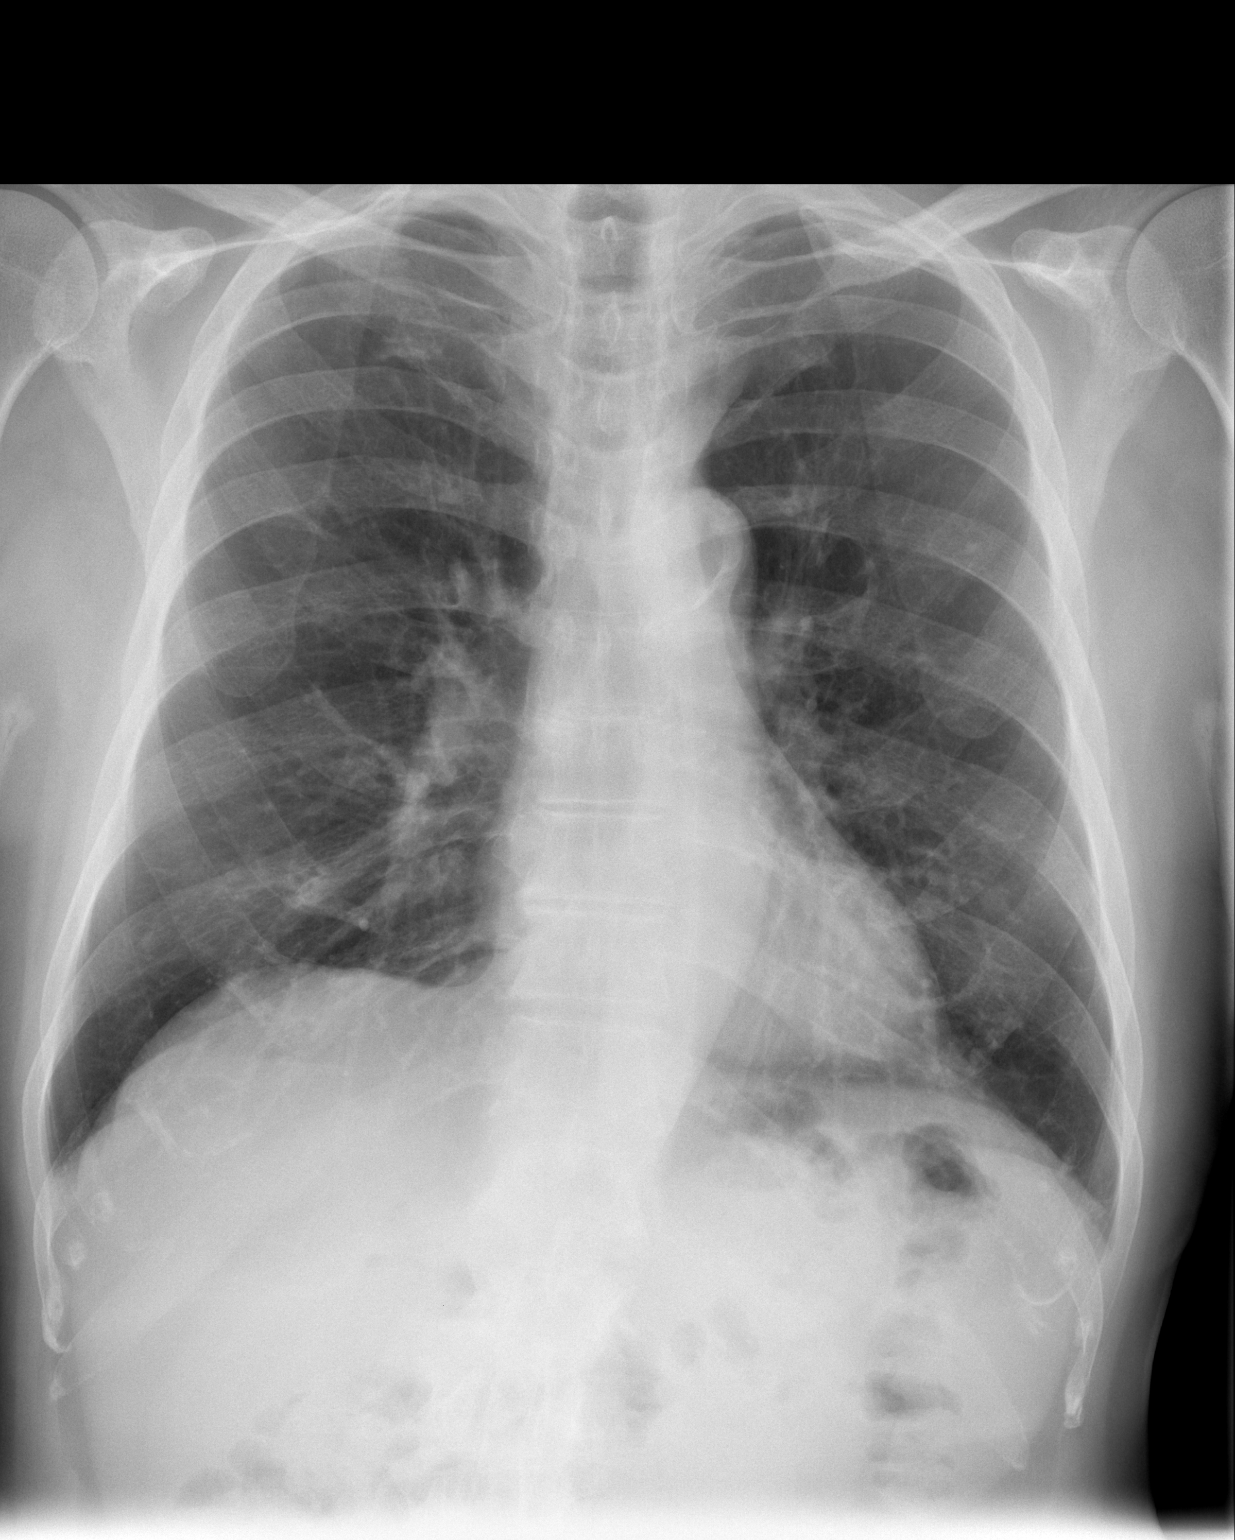

[w chest lat]
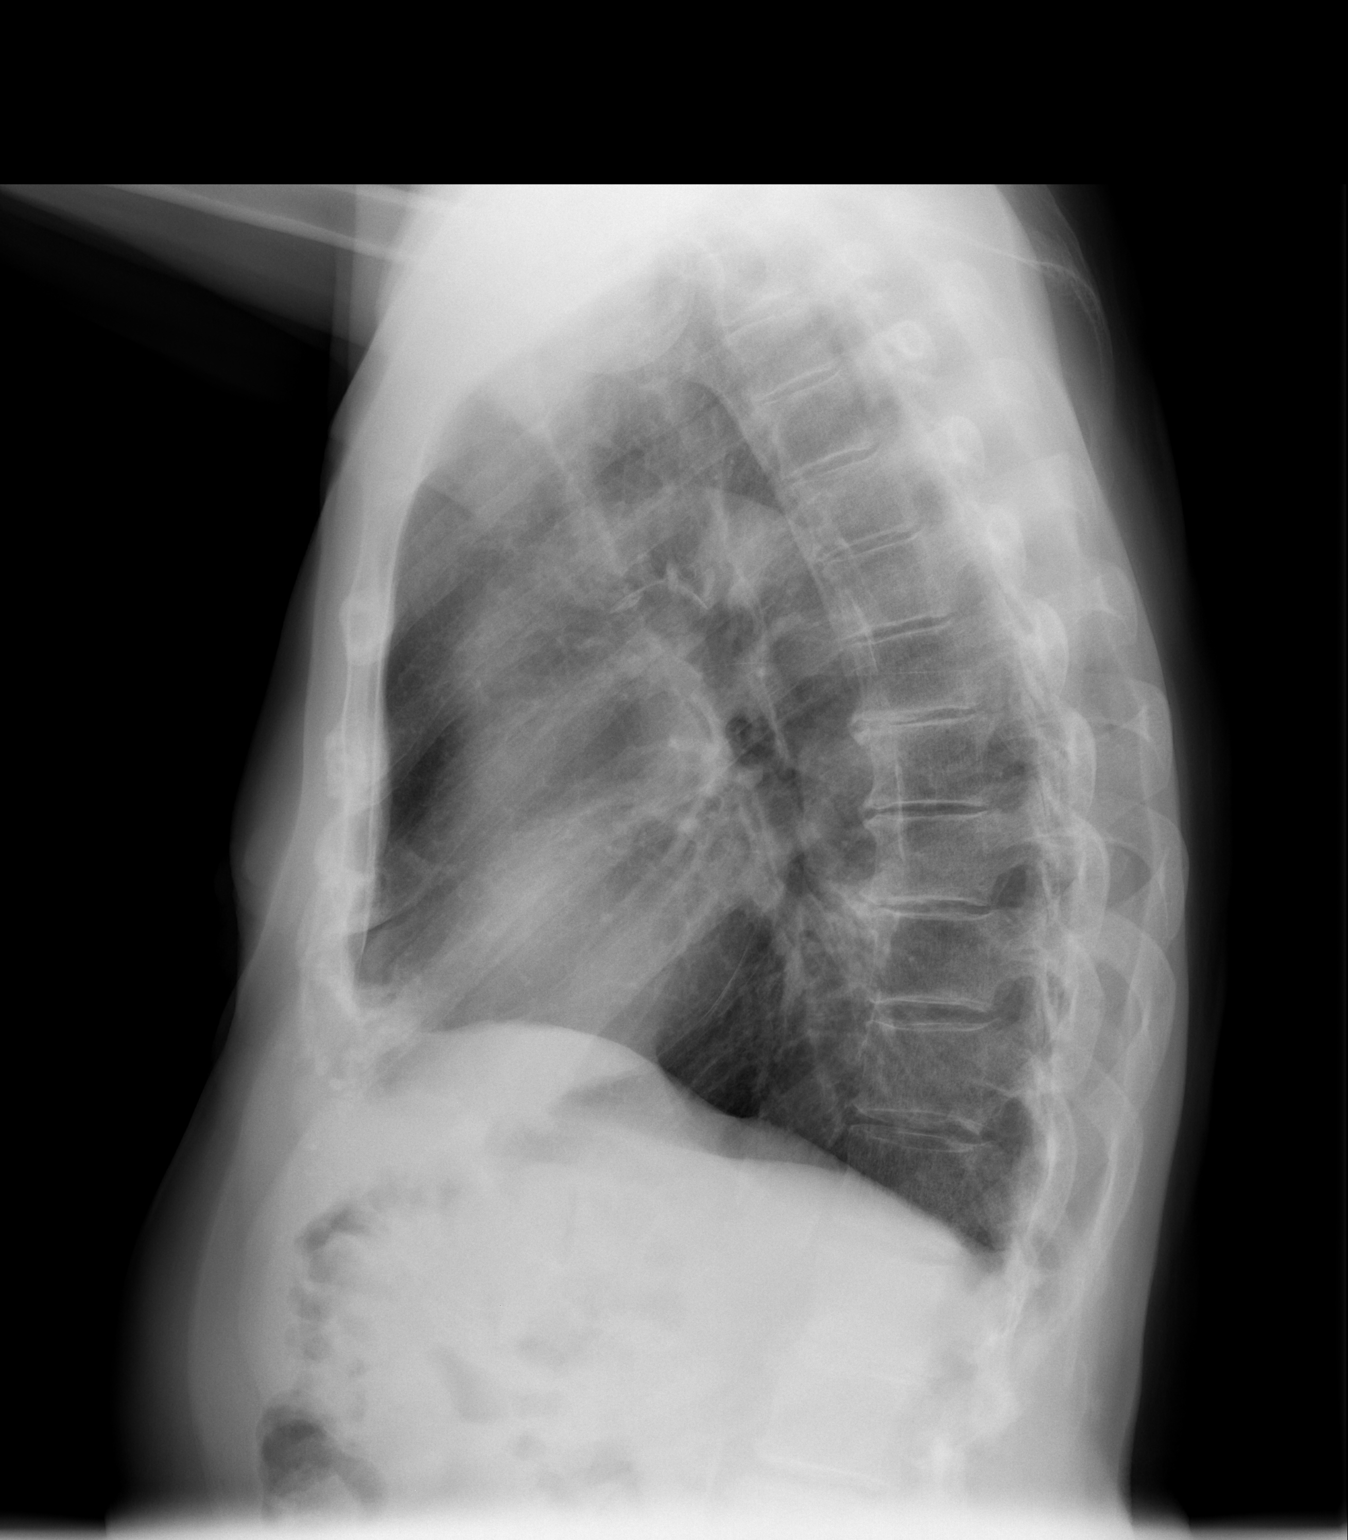

[2 of 2 positions shown; findings below may reference images not displayed]

FINDINGS: The heart size and mediastinal contours are within normal limits.
Both lungs are clear. The visualized skeletal structures are
unremarkable. Calcified granuloma is noted in the left mid lung.
Aortic calcifications are noted.
IMPRESSION: No active cardiopulmonary disease.

## 2016-03-25 DIAGNOSIS — N401 Enlarged prostate with lower urinary tract symptoms: Secondary | ICD-10-CM | POA: Diagnosis not present

## 2016-03-25 LAB — PSA: PSA: 12.5

## 2016-03-27 IMAGING — RF DG CHOLANGIOGRAM OPERATIVE
1 series · 6 of 6 positions shown · non-contrast
Comparison: 06/16/2013

CLINICAL DATA: Cholecystitis

EXAM:
INTRAOPERATIVE CHOLANGIOGRAM
TECHNIQUE: Cholangiographic images from the C-arm fluoroscopic device were
submitted for interpretation post-operatively. Please see the
procedural report for the amount of contrast and the fluoroscopy
time utilized.

[Series 1: run · 3 acquisitions, 6 frames shown]
[im 1/3]
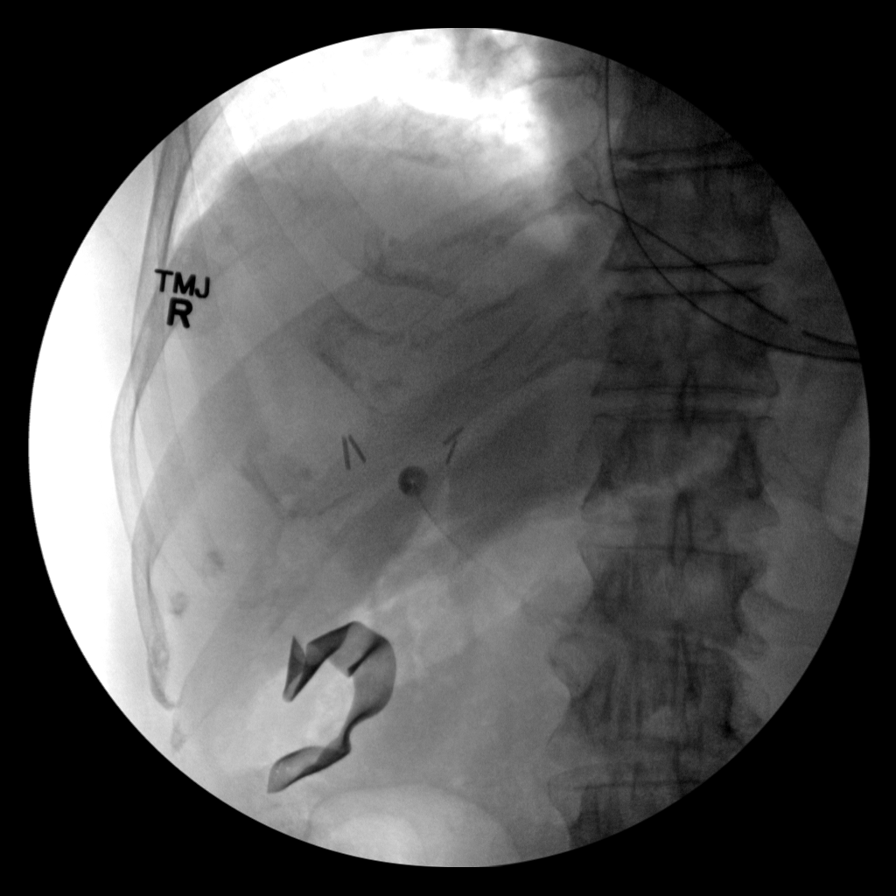
[im 1/3]
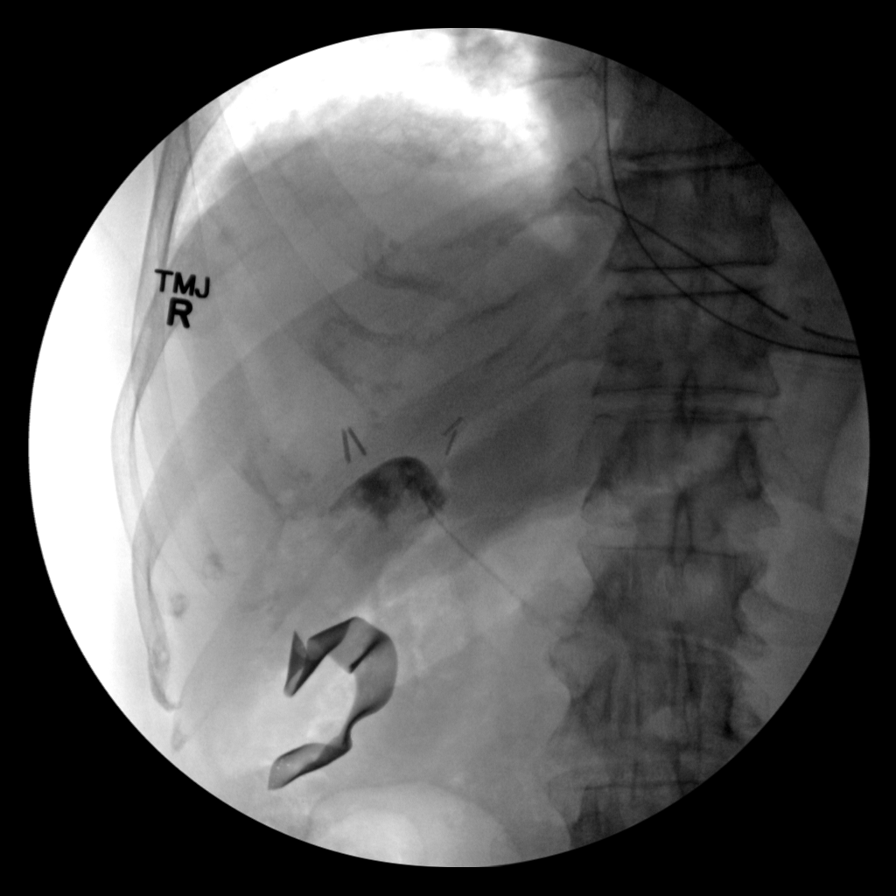
[im 1/3]
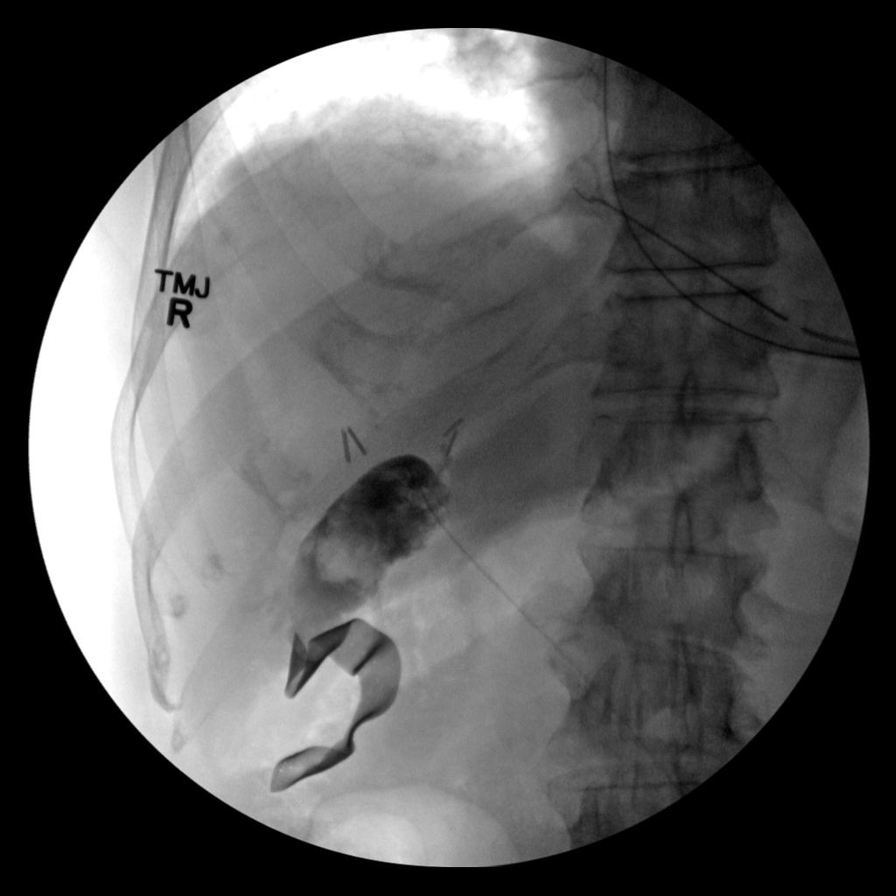
[im 1/3]
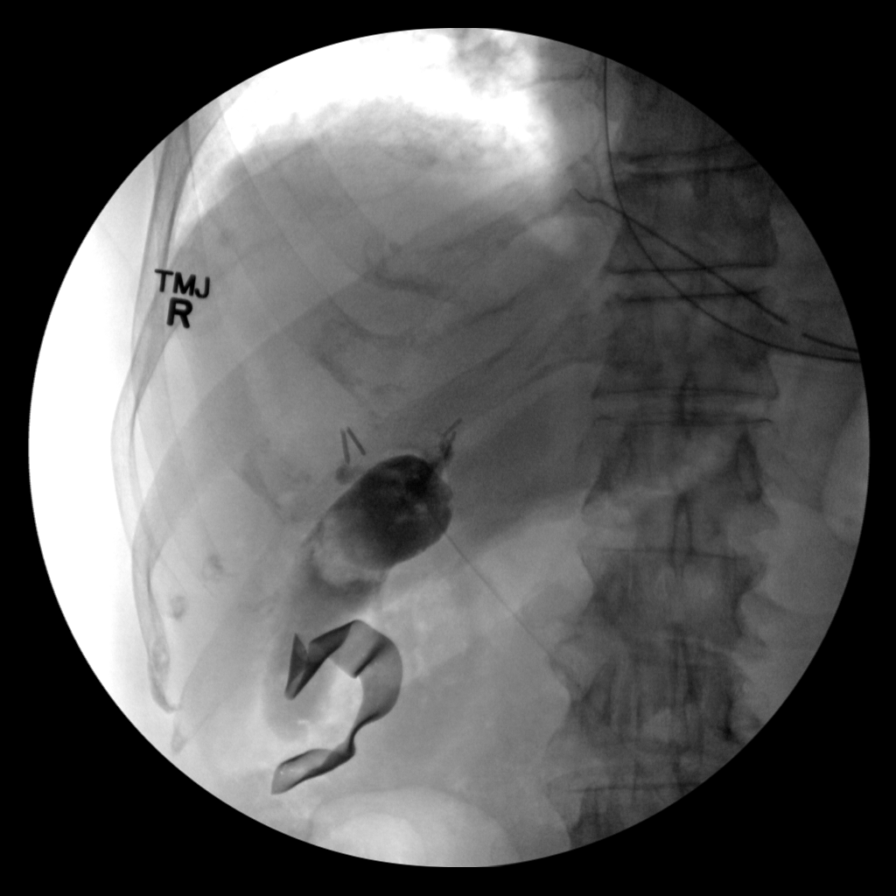
[im 2/3]
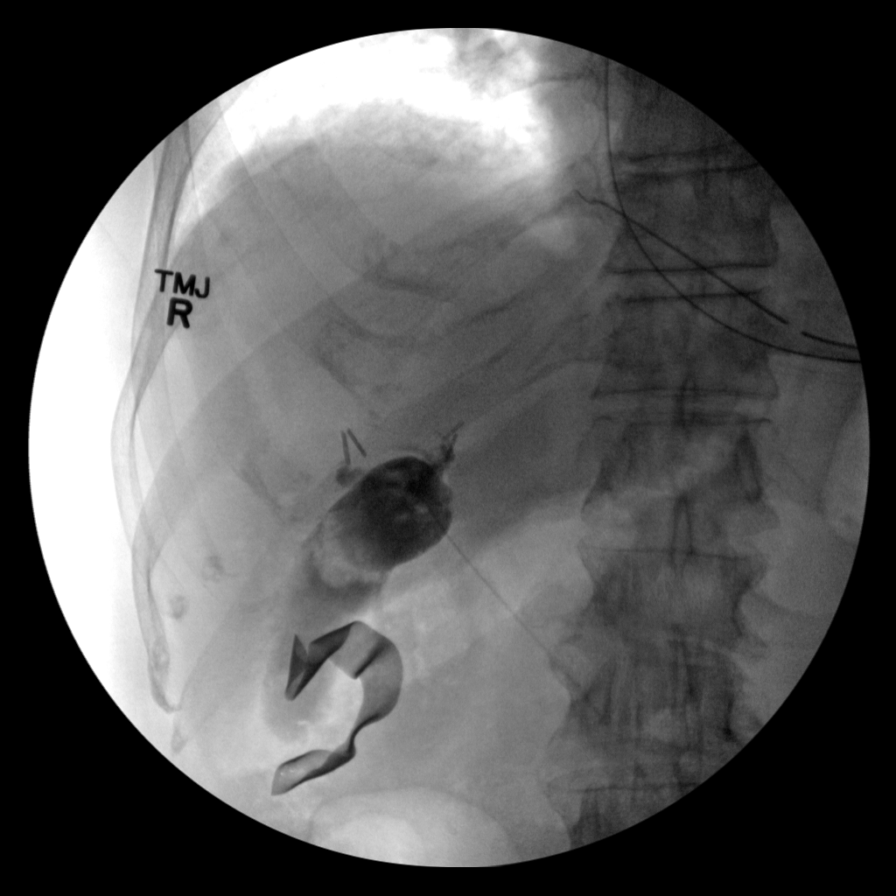
[im 3/3]
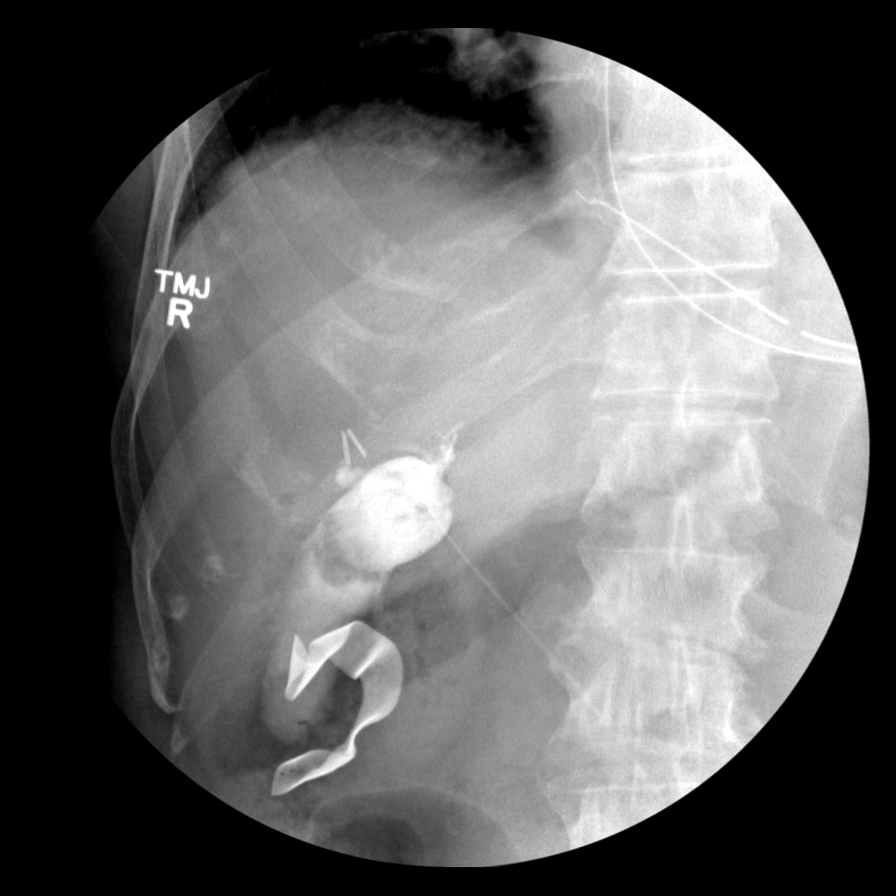

[6 of 6 positions shown; findings below may reference images not displayed]

FINDINGS: Intraoperative cholangiogram performed. This demonstrates
opacification of the gallbladder with a large intraluminal gallstone
and intraluminal sludge. Contrast is not seen to pass into the
cystic duct or common bile duct.
IMPRESSION: Limited intraoperative cholangiogram opacify the gallbladder. Large
gallstone demonstrated. Intraluminal sludge as well. Biliary system
is not opacified.

## 2016-04-01 DIAGNOSIS — R3912 Poor urinary stream: Secondary | ICD-10-CM | POA: Diagnosis not present

## 2016-04-01 DIAGNOSIS — N401 Enlarged prostate with lower urinary tract symptoms: Secondary | ICD-10-CM | POA: Diagnosis not present

## 2016-04-01 DIAGNOSIS — R972 Elevated prostate specific antigen [PSA]: Secondary | ICD-10-CM | POA: Diagnosis not present

## 2016-04-02 MED FILL — FLUTICASONE PROP 50 MCG SPR: 50 | 30 days supply | Qty: 16 | Fill #2

## 2016-04-07 DIAGNOSIS — R3129 Other microscopic hematuria: Secondary | ICD-10-CM | POA: Diagnosis not present

## 2016-04-10 ENCOUNTER — Other Ambulatory Visit: Payer: Self-pay | Admitting: Internal Medicine

## 2016-05-11 ENCOUNTER — Telehealth: Payer: Self-pay | Admitting: Internal Medicine

## 2016-05-11 DIAGNOSIS — H9190 Unspecified hearing loss, unspecified ear: Secondary | ICD-10-CM

## 2016-05-11 NOTE — Telephone Encounter (Signed)
Pt needing referral and notes sent to [facility]. He is scheduled for appt May 21 9:00am already. They are requesting referral be sent. Phone # [phone] and fax # [phone].

## 2016-05-11 NOTE — Telephone Encounter (Signed)
Referral placed.

## 2016-06-02 MED FILL — ALFUZOSIN HCL ER 10 MG TAB: 10 | 30 days supply | Qty: 30 | Fill #1

## 2016-06-02 MED FILL — FLUTICASONE PROP 50 MCG SPR: 50 | 30 days supply | Qty: 16 | Fill #3

## 2016-06-03 DIAGNOSIS — R310 Gross hematuria: Secondary | ICD-10-CM | POA: Diagnosis not present

## 2016-06-10 ENCOUNTER — Encounter: Payer: Self-pay | Admitting: Internal Medicine

## 2016-06-10 ENCOUNTER — Ambulatory Visit (INDEPENDENT_AMBULATORY_CARE_PROVIDER_SITE_OTHER): Payer: Medicare Other | Admitting: Internal Medicine

## 2016-06-10 VITALS — BP 138/68 | HR 62 | Temp 97.9°F | Wt 132.2 lb

## 2016-06-10 DIAGNOSIS — F411 Generalized anxiety disorder: Secondary | ICD-10-CM | POA: Diagnosis not present

## 2016-06-10 DIAGNOSIS — R7303 Prediabetes: Secondary | ICD-10-CM | POA: Diagnosis not present

## 2016-06-10 DIAGNOSIS — I10 Essential (primary) hypertension: Secondary | ICD-10-CM

## 2016-06-10 DIAGNOSIS — E785 Hyperlipidemia, unspecified: Secondary | ICD-10-CM | POA: Diagnosis not present

## 2016-06-10 NOTE — Progress Notes (Signed)
Subjective:    Patient ID: [name], male    DOB: Apr 26, 1926, 83 y.o.   MRN: [id number]  DOS:  06/10/2016 Type of visit - description :  Routine visit Interval history:  2 weeks history of sneezing, runny nose, over-the-counter oral medication not helping. Anxiety: At the last visit we increased Lexapro to 10 mg, make him drowsy so he is back taking 5 mg. It works, not to his complete satisfaction. HTN: Good compliance of medication, ambulatory BPs reportedly very good. Could not tell me specific readings High cholesterol: Good compliance of medication, last cholesterol satisfactory, due for LFTs.  Review of Systems   Past Medical History:  Diagnosis Date  . Allergic rhinitis   . Anemia   . Anxiety   . BPH (benign prostatic hypertrophy)    with weak urinary stream  . Cholecystoduodenal fistula 2016   Dr. Georgette Dover  . Common bile duct stone   . Diabetes mellitus   . Elevated PSA   . Esophageal stricture   . GERD (gastroesophageal reflux disease)    w/ esoph stricture  . Hiatal hernia   . History of colonic polyps   . Hyperlipidemia   . Hypertension   . Inguinal hernia 2016   Dr. Georgette Dover, Pt does not want to proceed with repair at this time  . Internal hemorrhoids   . Osteoarthritis   . Polyneuropathy    NCS by neurology per pt 5/07  . Stroke [facility])    CVA, per CT "old stroke"    Past Surgical History:  Procedure Laterality Date  . CARDIOVASCULAR STRESS TEST  02-2010   done in [place], had CP, +EKG changes, neg nuclear  imagins  . CATARACT EXTRACTION Bilateral 01/2009  . CHOLECYSTECTOMY N/A 08/16/2013   Procedure: LAPAROSCOPIC CONVERTED TO OPEN  CHOLECYSTECTOMY WITH INTRAOPERATIVE CHOLANGIOGRAM;  Surgeon: Imogene Burn. Georgette Dover, MD;  Location: [facility];  Service: General;  Laterality: N/A;  . ERCP W/ SPHICTEROTOMY     ????? Dr Deatra Ina  . HERNIA REPAIR     bil inguinal- per pt L in 90s, R in the 80s  . TOTAL KNEE ARTHROPLASTY Right 05/2006  . TOTAL KNEE ARTHROPLASTY Left 08/2006     Social History   Social History  . Marital status: Married    Spouse name: N/A  . Number of children: 2  . Years of education: N/A   Occupational History  . retired    Social History Main Topics  . Smoking status: Former Research scientist (life sciences)  . Smokeless tobacco: Former Systems developer    Quit date: 01/05/1961  . Alcohol use No  . Drug use: No  . Sexual activity: Not on file   Other Topics Concern  . Not on file   Social History Narrative   Retired, Married, original from [place], lives w/ wife     has two children, lost a son , has a daughter in [place]           Allergies as of 06/10/2016      Reactions   Amoxicillin Other (See Comments)   "loose motions" "giddiness" Has patient had a PCN reaction causing immediate rash, facial/tongue/throat swelling, SOB or lightheadedness with hypotension: No Has patient had a PCN reaction causing severe rash involving mucus membranes or skin necrosis: No Has patient had a PCN reaction that required hospitalization No Has patient had a PCN reaction occurring within the last 10 years: No If all of the above answers are "NO", then may proceed with Cephalosporin use.  Medication List       Accurate as of 06/10/16 11:59 PM. Always use your most recent med list.          alfuzosin 10 MG 24 hr tablet Commonly known as:  UROXATRAL Take 5 mg by mouth at bedtime.   AMBULATORY NON FORMULARY MEDICATION Medication Name: Abdominal Binder Dx: Ventral Hernia (K43.9)   amLODipine 5 MG tablet Commonly known as:  NORVASC Take 1 tablet (5 mg total) by mouth daily.   aspirin EC 81 MG tablet Take 81 mg by mouth daily.   atenolol 50 MG tablet Commonly known as:  TENORMIN Take 0.5 tablets (25 mg total) by mouth daily.   CENTRUM SILVER tablet Take 1 tablet by mouth daily.   escitalopram 10 MG tablet Commonly known as:  LEXAPRO Take 5 mg by mouth daily.   finasteride 5 MG tablet Commonly known as:  PROSCAR Take 1 tablet (5 mg total) by mouth  daily.   omeprazole 20 MG capsule Commonly known as:  PRILOSEC Take 1 capsule (20 mg total) by mouth 2 (two) times daily before a meal.   simvastatin 10 MG tablet Commonly known as:  ZOCOR Take 1 tablet (10 mg total) by mouth at bedtime.   valsartan 160 MG tablet Commonly known as:  DIOVAN Take 1 tablet (160 mg total) by mouth daily.          Objective:   Physical Exam BP 138/68   Pulse 62   Temp 97.9 F (36.6 C) (Oral)   Wt 132 lb 3.2 oz (60 kg)   SpO2 98%   BMI 22.00 kg/m  General:   Well developed, well nourished . NAD.  HEENT:  Normocephalic . Face symmetric, atraumatic. TMs normal, nose slightly congested. Sinuses not tender. Throat without redness  Lungs:  CTA B Normal respiratory effort, no intercostal retractions, no accessory muscle use. Heart: RRR,  no murmur.  No pretibial edema bilaterally  Skin: Not pale. Not jaundice Neurologic:  alert & oriented X3.  Speech normal, gait appropriate for age and unassisted Psych--  Cognition and judgment appear intact.  Cooperative with normal attention span and concentration.  Behavior appropriate. No anxious or depressed appearing.      Assessment & Plan:   Assessment  DM Polyneuropathy by NCS, saw neurology 2007, asx  HTN Hyperlipidemia Anxiety DJD: See surgeries GERD with esophageal stricture, HH H/o anemia  BPH, increase PSA, sees urology H/o Stroke ("old stroke" per CT) H/o  hernia, 2016, declined repair Admitted 04/2015: UTI, sepsis, Escherichia coli  PLAN Prediabetes: Diet control, check A1c HTN: Seems control on Tenormin, amlodipine Diovan. Check a CMP Hyperlipidemia: Well-controlled on simvastatin, check LFTs Anxiety: Lexapro increased to 10 mg, got drowsy so he is now back taking 5 mg daily, sx not well-controlled as he would like, I offered him to change to another medication hoping for better control but he declined. RTC 09-2016, CPX.

## 2016-06-10 NOTE — Progress Notes (Signed)
Pre visit review using our clinic review tool, if applicable. No additional management support is needed unless otherwise documented below in the visit note. 

## 2016-06-10 NOTE — Patient Instructions (Signed)
    GO TO THE FRONT DESK  Schedule labs to be done tomorrow fasting Schedule your next appointment for a  physical exam by September 2018

## 2016-06-11 ENCOUNTER — Other Ambulatory Visit (INDEPENDENT_AMBULATORY_CARE_PROVIDER_SITE_OTHER): Payer: Medicare Other

## 2016-06-11 DIAGNOSIS — R7303 Prediabetes: Secondary | ICD-10-CM

## 2016-06-11 DIAGNOSIS — I10 Essential (primary) hypertension: Secondary | ICD-10-CM

## 2016-06-11 DIAGNOSIS — E785 Hyperlipidemia, unspecified: Secondary | ICD-10-CM | POA: Diagnosis not present

## 2016-06-11 LAB — COMPREHENSIVE METABOLIC PANEL
ALT: 12 U/L (ref 0–53)
AST: 18 U/L (ref 0–37)
Albumin: 4.1 g/dL (ref 3.5–5.2)
Alkaline Phosphatase: 44 U/L (ref 39–117)
BUN: 16 mg/dL (ref 6–23)
CO2: 27 mEq/L (ref 19–32)
Calcium: 9.4 mg/dL (ref 8.4–10.5)
Chloride: 103 mEq/L (ref 96–112)
Creatinine, Ser: 1.16 mg/dL (ref 0.40–1.50)
GFR: 63.16 mL/min (ref >60.00)
Glucose, Bld: 101 mg/dL — ABNORMAL HIGH (ref 70–99)
Potassium: 3.7 mEq/L (ref 3.5–5.1)
Sodium: 139 mEq/L (ref 135–145)
Total Bilirubin: 1.1 mg/dL (ref 0.2–1.2)
Total Protein: 7.7 g/dL (ref 6.0–8.3)

## 2016-06-11 LAB — LIPID PANEL
Cholesterol: 110 mg/dL (ref 0–200)
HDL: 32.7 mg/dL — ABNORMAL LOW (ref >39.00)
LDL Cholesterol: 45 mg/dL (ref 0–99)
NonHDL: 76.81
Total CHOL/HDL Ratio: 3
Triglycerides: 161 mg/dL — ABNORMAL HIGH (ref 0.0–149.0)
VLDL: 32.2 mg/dL (ref 0.0–40.0)

## 2016-06-11 LAB — HEMOGLOBIN A1C: Hgb A1c MFr Bld: 6 % (ref 4.6–6.5)

## 2016-06-11 MED FILL — NITROFURANTOIN MCR 50 MG CA: 50 | 15 days supply | Qty: 30 | Fill #0

## 2016-06-11 NOTE — Assessment & Plan Note (Signed)
Prediabetes: Diet control, check A1c HTN: Seems control on Tenormin, amlodipine Diovan. Check a CMP Hyperlipidemia: Well-controlled on simvastatin, check LFTs Anxiety: Lexapro increased to 10 mg, got drowsy so he is now back taking 5 mg daily, sx not well-controlled as he would like, I offered him to change to another medication hoping for better control but he declined. RTC 09-2016, CPX.

## 2016-06-17 ENCOUNTER — Encounter: Payer: Self-pay | Admitting: Internal Medicine

## 2016-06-25 MED FILL — ESCITALOPRAM 10 MG TABLET: 10 | 90 days supply | Qty: 90 | Fill #1

## 2016-07-31 ENCOUNTER — Other Ambulatory Visit: Payer: Self-pay | Admitting: Internal Medicine

## 2016-07-31 MED ORDER — LOSARTAN POTASSIUM 50 MG PO TABS: 50.0000 mg | ORAL_TABLET | Freq: Every day | ORAL | 0 refills | Status: DC

## 2016-07-31 NOTE — Telephone Encounter (Signed)
Valsartan d/c, losartan 50mg  sent to [facility], #90 and 0RF. Tried calling home number- not in service, and mobile number also not in service.

## 2016-07-31 NOTE — Telephone Encounter (Signed)
Advise patient: Will change from valsartan 160 to losartan 50 mg 1 by mouth daily #30 and one refill. Recommend to monitor BPs, BMP when he comes back in September.

## 2016-07-31 NOTE — Telephone Encounter (Signed)
Pt on valsartan, please advise.

## 2016-08-03 ENCOUNTER — Telehealth: Payer: Self-pay | Admitting: Internal Medicine

## 2016-08-03 NOTE — Telephone Encounter (Signed)
Caller name:Brundage [place] Relationship to patient: Can be RJGYLUD:[phone] Pharmacy:  Reason for call:Would like to speak to nurse about medicine

## 2016-08-03 NOTE — Telephone Encounter (Signed)
Spoke w/ Pt, whom is currently out of the [place] in [place], informed him of valsartan recall and med change to losartan, informed him I'd tried to call but number we had in chart was disconnected. New number is [phone]. Pt has CPE scheduled for 10/02/2016 at 1330.

## 2016-08-04 NOTE — Telephone Encounter (Signed)
Spoke w/ Pt, his losartan supply will be shipped to his home in [place], [place] currently in [place], [place]. Tried to call [facility] they do not ship to [place]. Pt decided to stay on valsartan until he returns and will switch to losartan.

## 2016-08-04 NOTE — Telephone Encounter (Signed)
Patient calling again, has other questions regarding losartan, call back [phone]

## 2016-09-18 ENCOUNTER — Other Ambulatory Visit: Payer: Self-pay | Admitting: Internal Medicine

## 2016-10-02 ENCOUNTER — Encounter: Payer: Medicare Other | Admitting: Internal Medicine

## 2016-10-02 DIAGNOSIS — R972 Elevated prostate specific antigen [PSA]: Secondary | ICD-10-CM | POA: Diagnosis not present

## 2016-10-02 LAB — PSA: PSA: 12.6

## 2016-10-07 DIAGNOSIS — N401 Enlarged prostate with lower urinary tract symptoms: Secondary | ICD-10-CM | POA: Diagnosis not present

## 2016-10-07 DIAGNOSIS — R972 Elevated prostate specific antigen [PSA]: Secondary | ICD-10-CM | POA: Diagnosis not present

## 2016-10-07 DIAGNOSIS — R350 Frequency of micturition: Secondary | ICD-10-CM | POA: Diagnosis not present

## 2016-10-07 DIAGNOSIS — R310 Gross hematuria: Secondary | ICD-10-CM | POA: Diagnosis not present

## 2016-10-09 ENCOUNTER — Ambulatory Visit (INDEPENDENT_AMBULATORY_CARE_PROVIDER_SITE_OTHER): Payer: Medicare Other | Admitting: Internal Medicine

## 2016-10-09 ENCOUNTER — Encounter: Payer: Self-pay | Admitting: Internal Medicine

## 2016-10-09 ENCOUNTER — Telehealth: Payer: Self-pay | Admitting: Internal Medicine

## 2016-10-09 VITALS — BP 162/60 | HR 77 | Temp 98.1°F | Resp 14 | Ht 65.0 in | Wt 135.0 lb

## 2016-10-09 DIAGNOSIS — Z0000 Encounter for general adult medical examination without abnormal findings: Secondary | ICD-10-CM

## 2016-10-09 DIAGNOSIS — E785 Hyperlipidemia, unspecified: Secondary | ICD-10-CM | POA: Diagnosis not present

## 2016-10-09 DIAGNOSIS — F419 Anxiety disorder, unspecified: Secondary | ICD-10-CM | POA: Diagnosis not present

## 2016-10-09 DIAGNOSIS — K219 Gastro-esophageal reflux disease without esophagitis: Secondary | ICD-10-CM

## 2016-10-09 DIAGNOSIS — I10 Essential (primary) hypertension: Secondary | ICD-10-CM

## 2016-10-09 DIAGNOSIS — N400 Enlarged prostate without lower urinary tract symptoms: Secondary | ICD-10-CM

## 2016-10-09 DIAGNOSIS — Z23 Encounter for immunization: Secondary | ICD-10-CM | POA: Diagnosis not present

## 2016-10-09 MED ORDER — PANTOPRAZOLE SODIUM 40 MG PO TBEC: 40.0000 mg | DELAYED_RELEASE_TABLET | Freq: Every day | ORAL | 2 refills | Status: DC

## 2016-10-09 MED ORDER — FINASTERIDE 5 MG PO TABS: 5.0000 mg | ORAL_TABLET | Freq: Every day | ORAL | 2 refills | Status: DC

## 2016-10-09 MED ORDER — ATENOLOL 50 MG PO TABS: 25.0000 mg | ORAL_TABLET | Freq: Every day | ORAL | 2 refills | Status: DC

## 2016-10-09 MED ORDER — LOSARTAN POTASSIUM 50 MG PO TABS: 50.0000 mg | ORAL_TABLET | Freq: Every day | ORAL | 2 refills | Status: DC

## 2016-10-09 MED ORDER — AMLODIPINE BESYLATE 5 MG PO TABS
5.0000 mg | ORAL_TABLET | Freq: Every day | ORAL | 2 refills | Status: DC
Start: 2016-10-09 — End: 2017-04-26

## 2016-10-09 MED ORDER — FLUOXETINE HCL 10 MG PO CAPS: 10.0000 mg | ORAL_CAPSULE | Freq: Every day | ORAL | 0 refills | Status: DC

## 2016-10-09 MED ORDER — SIMVASTATIN 10 MG PO TABS: 10.0000 mg | ORAL_TABLET | Freq: Every day | ORAL | 2 refills | Status: DC

## 2016-10-09 MED FILL — CIPROFLOXACIN HCL 250 MG TA: 250 | 7 days supply | Qty: 14 | Fill #0

## 2016-10-09 MED FILL — PANTOPRAZOLE SOD DR 40 MG T: 40 | 90 days supply | Qty: 90 | Fill #0

## 2016-10-09 NOTE — Progress Notes (Signed)
Subjective:    Patient ID: [name], male    DOB: 1926/04/06, 83 y.o.   MRN: [id number]  DOS:  10/09/2016 Type of visit - description : CPX Interval history: Since the last visit, he spent several months in [place],   losartan was not available but is taking valsartan. Plans to switch to losartan today. Ambulatory BPs normal. Anxiety depression: Still an issue, self discontinue Lexapro due to feeling sleepy. On chronic Prilosec, lately has been having breakthrough symptoms, mostly mild heartburn.   Wt Readings from Last 3 Encounters:  10/09/16 135 lb (61.2 kg)  06/10/16 132 lb 3.2 oz (60 kg)  01/08/16 130 lb 2 oz (59 kg)    Review of Systems Denies depression per se just anxiety. No insomnia No weight loss, dysphagia or odynophagia.   Past Medical History:  Diagnosis Date  . Allergic rhinitis   . Anemia   . Anxiety   . BPH (benign prostatic hypertrophy)    with weak urinary stream  . Cholecystoduodenal fistula 2016   Dr. Georgette Dover  . Common bile duct stone   . Diabetes mellitus   . Elevated PSA   . Esophageal stricture   . GERD (gastroesophageal reflux disease)    w/ esoph stricture  . Hiatal hernia   . History of colonic polyps   . Hyperlipidemia   . Hypertension   . Inguinal hernia 2016   Dr. Georgette Dover, Pt does not want to proceed with repair at this time  . Internal hemorrhoids   . Osteoarthritis   . Polyneuropathy    NCS by neurology per pt 5/07  . Stroke [facility])    CVA, per CT "old stroke"    Past Surgical History:  Procedure Laterality Date  . CARDIOVASCULAR STRESS TEST  02-2010   done in [place], had CP, +EKG changes, neg nuclear  imagins  . CATARACT EXTRACTION Bilateral 01/2009  . CHOLECYSTECTOMY N/A 08/16/2013   Procedure: LAPAROSCOPIC CONVERTED TO OPEN  CHOLECYSTECTOMY WITH INTRAOPERATIVE CHOLANGIOGRAM;  Surgeon: Imogene Burn. Georgette Dover, MD;  Location: [facility];  Service: General;  Laterality: N/A;  . ERCP W/ SPHICTEROTOMY     ????? Dr Deatra Ina  . HERNIA REPAIR     bil inguinal- per pt L in 90s, R in the 80s  . TOTAL KNEE ARTHROPLASTY Right 05/2006  . TOTAL KNEE ARTHROPLASTY Left 08/2006    Social History   Social History  . Marital status: Married    Spouse name: N/A  . Number of children: 2  . Years of education: N/A   Occupational History  . retired    Social History Main Topics  . Smoking status: Former Research scientist (life sciences)  . Smokeless tobacco: Former Systems developer    Quit date: 01/05/1961  . Alcohol use No  . Drug use: No  . Sexual activity: Not on file   Other Topics Concern  . Not on file   Social History Narrative   Retired, Married, original from [place], lives w/ wife     has two children, lost a son , has a daughter in [place]           Allergies as of 10/09/2016      Reactions   Amoxicillin Other (See Comments)   "loose motions" "giddiness" Has patient had a PCN reaction causing immediate rash, facial/tongue/throat swelling, SOB or lightheadedness with hypotension: No Has patient had a PCN reaction causing severe rash involving mucus membranes or skin necrosis: No Has patient had a PCN reaction that required hospitalization No Has  patient had a PCN reaction occurring within the last 10 years: No If all of the above answers are "NO", then may proceed with Cephalosporin use.      Medication List       Accurate as of 10/09/16 11:59 PM. Always use your most recent med list.          alfuzosin 10 MG 24 hr tablet Commonly known as:  UROXATRAL Take 5 mg by mouth at bedtime.   AMBULATORY NON FORMULARY MEDICATION Medication Name: Abdominal Binder Dx: Ventral Hernia (K43.9)   amLODipine 5 MG tablet Commonly known as:  NORVASC Take 1 tablet (5 mg total) by mouth daily.   aspirin EC 81 MG tablet Take 81 mg by mouth daily.   atenolol 50 MG tablet Commonly known as:  TENORMIN Take 0.5 tablets (25 mg total) by mouth daily.   CENTRUM SILVER tablet Take 1 tablet by mouth daily.   finasteride 5 MG tablet Commonly known as:   PROSCAR Take 1 tablet (5 mg total) by mouth daily.   FLUoxetine 10 MG capsule Commonly known as:  PROZAC Take 1 capsule (10 mg total) by mouth daily.   losartan 50 MG tablet Commonly known as:  COZAAR Take 1 tablet (50 mg total) by mouth daily.   pantoprazole 40 MG tablet Commonly known as:  PROTONIX Take 1 tablet (40 mg total) by mouth daily.   simvastatin 10 MG tablet Commonly known as:  ZOCOR Take 1 tablet (10 mg total) by mouth at bedtime.          Objective:   Physical Exam BP (!) 162/60 (BP Location: Left Arm, Patient Position: Sitting, Cuff Size: Small)   Pulse 77   Temp 98.1 F (36.7 C) (Oral)   Resp 14   Ht 5\' 5"  (1.651 m)   Wt 135 lb (61.2 kg)   SpO2 97%   BMI 22.47 kg/m   General:   Well developed, elderly gentleman, in no distress. NAD.  Neck: No  thyromegaly  HEENT:  Normocephalic . Face symmetric, atraumatic Lungs:  CTA B Normal respiratory effort, no intercostal retractions, no accessory muscle use. Heart: RRR,  no murmur.  No pretibial edema bilaterally  Abdomen:  Not distended, soft, non-tender. No rebound or rigidity.   Skin: Exposed areas without rash. Not pale. Not jaundice Neurologic:  alert & oriented X3.  Speech normal, gait appropriate for age and unassisted Strength symmetric and appropriate for age.  Psych: Cognition and judgment appear intact.  Cooperative with normal attention span and concentration.  Behavior appropriate. No anxious or depressed appearing.    Assessment & Plan:   Assessment  DM Polyneuropathy by NCS, saw neurology 2007, asx  HTN Hyperlipidemia Anxiety DJD: See surgeries GERD with esophageal stricture, HH H/o anemia  BPH, increase PSA, sees urology H/o Stroke ("old stroke" per CT) H/o  hernia, 2016, declined repair Admitted 04/2015: UTI, sepsis, Escherichia coli  PLAN HTN: Plans to switch from valsartan to losartan today. Check labs in 10 days. BP upon arrival is slightly elevated with rechecked and  is 146/60. Continue losartan, amlodipine and atenolol. Monitor BPs at home. DM: Diet control, last A1c satisfactory Hyperlipidemia: Last FLP and LFTs 06/2016 satisfactory,  no change Anxiety: Previously treated with Xanax and Lexapro,got too sleepy. Still anxious but not depressed. Rec low dose of fluoxetine, 10 mg daily. Reassess in 2 months BPH: Urology note reviewed GERD: breakthrough sx w/ omeprazole,  change to Protonix, call if no better or if he has red flag symptoms  such as dysphagia or odynophagia. RTC 2 months.

## 2016-10-09 NOTE — Patient Instructions (Signed)
GO TO THE FRONT DESK Schedule labs to be done in 10 days from today  Schedule your next appointment for a  checkup in 2 months  Start fluoxetine 1 tablet daily for anxiety  Stop Prilosec, try Protonix 1 tablet before breakfast every day   Check the  blood pressure 2 or 3 times a month   Be sure your blood pressure is between 110/65 and  145/85. If it is consistently higher or lower, let me know

## 2016-10-09 NOTE — Progress Notes (Signed)
Pre visit review using our clinic review tool, if applicable. No additional management support is needed unless otherwise documented below in the visit note. 

## 2016-10-09 NOTE — Telephone Encounter (Signed)
Pt was just seen and would like to speak with assistant about medications.   He would like a call back today if possible.

## 2016-10-09 NOTE — Assessment & Plan Note (Addendum)
-  Td 3-12 ; Pneumovax 2006 ; prevnar 2015; shingles shot Rx was provided before, shingrix discussed today;  flu shot today  -CCS:no longer indicated  bone Density: 10/21/2005 and  11-09, both normal- neg   Prostate cancer screening: per urology Counseling: diet discussed , encouraged to saty active

## 2016-10-10 NOTE — Assessment & Plan Note (Addendum)
HTN: Plans to switch from valsartan to losartan today. Check labs in 10 days. BP upon arrival is slightly elevated with rechecked and is 146/60. Continue losartan, amlodipine and atenolol. Monitor BPs at home. DM: Diet control, last A1c satisfactory Hyperlipidemia: Last FLP and LFTs 06/2016 satisfactory,  no change Anxiety: Previously treated with Xanax and Lexapro,got too sleepy. Still anxious but not depressed. Rec low dose of fluoxetine, 10 mg daily. Reassess in 2 months BPH: Urology note reviewed GERD: breakthrough sx w/ omeprazole,  change to Protonix, call if no better or if he has red flag symptoms such as dysphagia or odynophagia. RTC 2 months.

## 2016-10-12 ENCOUNTER — Other Ambulatory Visit (INDEPENDENT_AMBULATORY_CARE_PROVIDER_SITE_OTHER): Payer: Medicare Other

## 2016-10-12 DIAGNOSIS — Z0000 Encounter for general adult medical examination without abnormal findings: Secondary | ICD-10-CM

## 2016-10-12 DIAGNOSIS — K4090 Unilateral inguinal hernia, without obstruction or gangrene, not specified as recurrent: Secondary | ICD-10-CM | POA: Diagnosis not present

## 2016-10-12 DIAGNOSIS — I10 Essential (primary) hypertension: Secondary | ICD-10-CM

## 2016-10-12 DIAGNOSIS — K432 Incisional hernia without obstruction or gangrene: Secondary | ICD-10-CM | POA: Diagnosis not present

## 2016-10-12 LAB — BASIC METABOLIC PANEL
BUN: 17 mg/dL (ref 6–23)
CO2: 28 mEq/L (ref 19–32)
Calcium: 9.4 mg/dL (ref 8.4–10.5)
Chloride: 102 mEq/L (ref 96–112)
Creatinine, Ser: 1.32 mg/dL (ref 0.40–1.50)
GFR: 54.37 mL/min — ABNORMAL LOW (ref >60.00)
Glucose, Bld: 169 mg/dL — ABNORMAL HIGH (ref 70–99)
Potassium: 4.4 mEq/L (ref 3.5–5.1)
Sodium: 136 mEq/L (ref 135–145)

## 2016-10-12 LAB — CBC WITH DIFFERENTIAL/PLATELET
Basophils Absolute: 0.1 10*3/uL (ref 0.0–0.1)
Basophils Relative: 1.1 % (ref 0.0–3.0)
Eosinophils Absolute: 0.5 10*3/uL (ref 0.0–0.7)
Eosinophils Relative: 5.5 % — ABNORMAL HIGH (ref 0.0–5.0)
HCT: 37 % — ABNORMAL LOW (ref 39.0–52.0)
Hemoglobin: 12.7 g/dL — ABNORMAL LOW (ref 13.0–17.0)
Lymphocytes Relative: 35.9 % (ref 12.0–46.0)
Lymphs Abs: 3 10*3/uL (ref 0.7–4.0)
MCHC: 34.2 g/dL (ref 30.0–36.0)
MCV: 89.9 fl (ref 78.0–100.0)
Monocytes Absolute: 0.6 10*3/uL (ref 0.1–1.0)
Monocytes Relative: 7.2 % (ref 3.0–12.0)
Neutro Abs: 4.2 10*3/uL (ref 1.4–7.7)
Neutrophils Relative %: 50.3 % (ref 43.0–77.0)
Platelets: 181 10*3/uL (ref 150.0–400.0)
RBC: 4.12 Mil/uL — ABNORMAL LOW (ref 4.22–5.81)
RDW: 14.9 % (ref 11.5–15.5)
WBC: 8.4 10*3/uL (ref 4.0–10.5)

## 2016-10-12 NOTE — Telephone Encounter (Signed)
LMOM informing Pt to return call.  

## 2016-10-12 NOTE — Telephone Encounter (Signed)
Pt came to office for labs today, he had several questions regarding med list, answered them to best of my ability. Pt thanked me for my help.

## 2016-10-13 ENCOUNTER — Encounter: Payer: Self-pay | Admitting: Family Medicine

## 2016-10-13 ENCOUNTER — Other Ambulatory Visit: Payer: Self-pay

## 2016-10-13 ENCOUNTER — Ambulatory Visit (INDEPENDENT_AMBULATORY_CARE_PROVIDER_SITE_OTHER): Payer: Medicare Other | Admitting: Family Medicine

## 2016-10-13 DIAGNOSIS — S8992XA Unspecified injury of left lower leg, initial encounter: Secondary | ICD-10-CM

## 2016-10-13 MED ORDER — PANTOPRAZOLE SODIUM 40 MG PO TBEC: 40.0000 mg | DELAYED_RELEASE_TABLET | Freq: Every day | ORAL | 2 refills | Status: DC

## 2016-10-13 NOTE — Patient Instructions (Signed)
You have a partial tear of your quad muscle. These typically take 4-6 weeks to resolve. Do the straight leg raises, knee extension exercises 3 sets of 10 once a day. Add 2 pound ankle weight if these become too easy. Ice or heat (whichever feels better at this point) 15 minutes at a time 3-4 times a day. Compression sleeve or ace wrap when up and walking around. Continue with the aspercreme. Follow up with me in 4 weeks for reevaluation.

## 2016-10-14 DIAGNOSIS — S8992XD Unspecified injury of left lower leg, subsequent encounter: Secondary | ICD-10-CM | POA: Insufficient documentation

## 2016-10-14 NOTE — Assessment & Plan Note (Signed)
grade 2, partial tear.  Compression, ice or heat.  Tylenol and aspercreme if needed.  Shown home exercises to do daily.  Expect 4-6 weeks to resolve.  F/u in 4 weeks for reevaluation.  Consider physical therapy if not improving.

## 2016-10-14 NOTE — Progress Notes (Signed)
PCP: Colon Branch, MD  Subjective:   HPI: Patient is a 83 y.o. male here for left leg pain.  Patient reports on 10/6 he was going up steps and had shoe caught on lip of one and fell forward and to left side. He caught himself with his left hand and doesn't believe left leg struck the stairs or ground. Since then he's had pain anterolateral left thigh with associated swelling. Pain radiates down to knee. Worse with walking, getting up from a chair. Has been using aspercreme which helps. Pain up to 4/10 and dull at worst. No skin changes, numbness.  Past Medical History:  Diagnosis Date  . Allergic rhinitis   . Anemia   . Anxiety   . BPH (benign prostatic hypertrophy)    with weak urinary stream  . Cholecystoduodenal fistula 2016   Dr. Georgette Dover  . Common bile duct stone   . Diabetes mellitus   . Elevated PSA   . Esophageal stricture   . GERD (gastroesophageal reflux disease)    w/ esoph stricture  . Hiatal hernia   . History of colonic polyps   . Hyperlipidemia   . Hypertension   . Inguinal hernia 2016   Dr. Georgette Dover, Pt does not want to proceed with repair at this time  . Internal hemorrhoids   . Osteoarthritis   . Polyneuropathy    NCS by neurology per pt 5/07  . Stroke [facility])    CVA, per CT "old stroke"    Current Outpatient Prescriptions on File Prior to Visit  Medication Sig Dispense Refill  . alfuzosin (UROXATRAL) 10 MG 24 hr tablet Take 5 mg by mouth at bedtime.   11  . AMBULATORY NON FORMULARY MEDICATION Medication Name: Abdominal Binder Dx: Ventral Hernia (K43.9) 1 each 0  . amLODipine (NORVASC) 5 MG tablet Take 1 tablet (5 mg total) by mouth daily. 90 tablet 2  . aspirin EC 81 MG tablet Take 81 mg by mouth daily.    Marland Kitchen atenolol (TENORMIN) 50 MG tablet Take 0.5 tablets (25 mg total) by mouth daily. 45 tablet 2  . finasteride (PROSCAR) 5 MG tablet Take 1 tablet (5 mg total) by mouth daily. 90 tablet 2  . FLUoxetine (PROZAC) 10 MG capsule Take 1 capsule (10 mg total)  by mouth daily. 90 capsule 0  . losartan (COZAAR) 50 MG tablet Take 1 tablet (50 mg total) by mouth daily. 90 tablet 2  . Multiple Vitamins-Minerals (CENTRUM SILVER) tablet Take 1 tablet by mouth daily.    . simvastatin (ZOCOR) 10 MG tablet Take 1 tablet (10 mg total) by mouth at bedtime. 90 tablet 2   No current facility-administered medications on file prior to visit.     Past Surgical History:  Procedure Laterality Date  . CARDIOVASCULAR STRESS TEST  02-2010   done in [place], had CP, +EKG changes, neg nuclear  imagins  . CATARACT EXTRACTION Bilateral 01/2009  . CHOLECYSTECTOMY N/A 08/16/2013   Procedure: LAPAROSCOPIC CONVERTED TO OPEN  CHOLECYSTECTOMY WITH INTRAOPERATIVE CHOLANGIOGRAM;  Surgeon: Imogene Burn. Georgette Dover, MD;  Location: [place];  Service: General;  Laterality: N/A;  . ERCP W/ SPHICTEROTOMY     ????? Dr Deatra Ina  . HERNIA REPAIR     bil inguinal- per pt L in 90s, R in the 80s  . TOTAL KNEE ARTHROPLASTY Right 05/2006  . TOTAL KNEE ARTHROPLASTY Left 08/2006    Allergies  Allergen Reactions  . Amoxicillin Other (See Comments)    "loose motions" "giddiness" Has patient had  a PCN reaction causing immediate rash, facial/tongue/throat swelling, SOB or lightheadedness with hypotension: No Has patient had a PCN reaction causing severe rash involving mucus membranes or skin necrosis: No Has patient had a PCN reaction that required hospitalization No Has patient had a PCN reaction occurring within the last 10 years: No If all of the above answers are "NO", then may proceed with Cephalosporin use.     Social History   Social History  . Marital status: Married    Spouse name: N/A  . Number of children: 2  . Years of education: N/A   Occupational History  . retired    Social History Main Topics  . Smoking status: Former Research scientist (life sciences)  . Smokeless tobacco: Former Systems developer    Quit date: 01/05/1961  . Alcohol use No  . Drug use: No  . Sexual activity: Not on file   Other Topics Concern  . Not  on file   Social History Narrative   Retired, Married, original from [place], lives w/ wife     has two children, lost a son , has a daughter in [place]         Family History  Problem Relation Age of Onset  . Diabetes Brother   . Heart attack Brother 43  . Liver cancer Brother   . Colon cancer Neg Hx   . Prostate cancer Neg Hx     BP 138/64   Pulse (!) 59   Ht 5\' 5"  (1.651 m)   Wt 130 lb (59 kg)   BMI 21.63 kg/m   Review of Systems: See HPI above.     Objective:  Physical Exam:  Gen: NAD, comfortable in exam room  Left leg: No gross deformity, swelling, bruising, muscle defect. TTP prox-mid vastus lateralis.  No other tenderness of hip, knee, elsewhere in leg. FROM knee and hip.  No pain on resisted hip flexion, external rotation, knee flexion and extension. Negative ant/post drawers. Negative valgus/varus testing. Negative lachmanns. Negative mcmurrays, apleys, patellar apprehension. NV intact distally.  Right leg: FROM knee and hip without pain.  MSK u/s left quad: Partial tear of vastus lateralis noted with edema.    Assessment & Plan:  1. Left quad strain - grade 2, partial tear.  Compression, ice or heat.  Tylenol and aspercreme if needed.  Shown home exercises to do daily.  Expect 4-6 weeks to resolve.  F/u in 4 weeks for reevaluation.  Consider physical therapy if not improving.

## 2016-11-09 ENCOUNTER — Ambulatory Visit: Payer: Medicare Other | Admitting: Internal Medicine

## 2016-11-10 ENCOUNTER — Ambulatory Visit: Payer: Medicare Other | Admitting: Family Medicine

## 2016-11-10 ENCOUNTER — Encounter: Payer: Self-pay | Admitting: Family Medicine

## 2016-11-10 DIAGNOSIS — S8992XD Unspecified injury of left lower leg, subsequent encounter: Secondary | ICD-10-CM

## 2016-11-10 NOTE — Patient Instructions (Signed)
You're doing great! I would do the home exercises for 2 more weeks then discontinue these. Follow up with me as needed.

## 2016-11-11 ENCOUNTER — Encounter: Payer: Self-pay | Admitting: Family Medicine

## 2016-11-11 NOTE — Progress Notes (Signed)
PCP: Colon Branch, MD  Subjective:   HPI: Patient is a 83 y.o. male here for left leg pain.  10/9: Patient reports on 10/6 he was going up steps and had shoe caught on lip of one and fell forward and to left side. He caught himself with his left hand and doesn't believe left leg struck the stairs or ground. Since then he's had pain anterolateral left thigh with associated swelling. Pain radiates down to knee. Worse with walking, getting up from a chair. Has been using aspercreme which helps. Pain up to 4/10 and dull at worst. No skin changes, numbness.  11/6: Patient reports he's doing very well. More than 80% improved from last visit. Did home exercises until yesterday. Not taking any medicines for this now - did use some aspercreme. Has been heating this the past month. No skin changes, numbness. Pain is 0/10 level.  Past Medical History:  Diagnosis Date  . Allergic rhinitis   . Anemia   . Anxiety   . BPH (benign prostatic hypertrophy)    with weak urinary stream  . Cholecystoduodenal fistula 2016   Dr. Georgette Dover  . Common bile duct stone   . Diabetes mellitus   . Elevated PSA   . Esophageal stricture   . GERD (gastroesophageal reflux disease)    w/ esoph stricture  . Hiatal hernia   . History of colonic polyps   . Hyperlipidemia   . Hypertension   . Inguinal hernia 2016   Dr. Georgette Dover, Pt does not want to proceed with repair at this time  . Internal hemorrhoids   . Osteoarthritis   . Polyneuropathy    NCS by neurology per pt 5/07  . Stroke [facility])    CVA, per CT "old stroke"    Current Outpatient Medications on File Prior to Visit  Medication Sig Dispense Refill  . alfuzosin (UROXATRAL) 10 MG 24 hr tablet Take 5 mg by mouth at bedtime.   11  . AMBULATORY NON FORMULARY MEDICATION Medication Name: Abdominal Binder Dx: Ventral Hernia (K43.9) 1 each 0  . amLODipine (NORVASC) 5 MG tablet Take 1 tablet (5 mg total) by mouth daily. 90 tablet 2  . aspirin EC 81 MG tablet  Take 81 mg by mouth daily.    Marland Kitchen atenolol (TENORMIN) 50 MG tablet Take 0.5 tablets (25 mg total) by mouth daily. 45 tablet 2  . finasteride (PROSCAR) 5 MG tablet Take 1 tablet (5 mg total) by mouth daily. 90 tablet 2  . FLUoxetine (PROZAC) 10 MG capsule Take 1 capsule (10 mg total) by mouth daily. 90 capsule 0  . losartan (COZAAR) 50 MG tablet Take 1 tablet (50 mg total) by mouth daily. 90 tablet 2  . Multiple Vitamins-Minerals (CENTRUM SILVER) tablet Take 1 tablet by mouth daily.    . pantoprazole (PROTONIX) 40 MG tablet Take 1 tablet (40 mg total) by mouth daily. 90 tablet 2  . simvastatin (ZOCOR) 10 MG tablet Take 1 tablet (10 mg total) by mouth at bedtime. 90 tablet 2   No current facility-administered medications on file prior to visit.     Past Surgical History:  Procedure Laterality Date  . CARDIOVASCULAR STRESS TEST  02-2010   done in [place], had CP, +EKG changes, neg nuclear  imagins  . CATARACT EXTRACTION Bilateral 01/2009  . ERCP W/ SPHICTEROTOMY     ????? Dr Deatra Ina  . HERNIA REPAIR     bil inguinal- per pt L in 90s, R in the 80s  .  TOTAL KNEE ARTHROPLASTY Right 05/2006  . TOTAL KNEE ARTHROPLASTY Left 08/2006    Allergies  Allergen Reactions  . Amoxicillin Other (See Comments)    "loose motions" "giddiness" Has patient had a PCN reaction causing immediate rash, facial/tongue/throat swelling, SOB or lightheadedness with hypotension: No Has patient had a PCN reaction causing severe rash involving mucus membranes or skin necrosis: No Has patient had a PCN reaction that required hospitalization No Has patient had a PCN reaction occurring within the last 10 years: No If all of the above answers are "NO", then may proceed with Cephalosporin use.     Social History   Socioeconomic History  . Marital status: Married    Spouse name: Not on file  . Number of children: 2  . Years of education: Not on file  . Highest education level: Not on file  Social Needs  . Financial  resource strain: Not on file  . Food insecurity - worry: Not on file  . Food insecurity - inability: Not on file  . Transportation needs - medical: Not on file  . Transportation needs - non-medical: Not on file  Occupational History  . Occupation: retired  Tobacco Use  . Smoking status: Former Research scientist (life sciences)  . Smokeless tobacco: Former Systems developer    Quit date: 01/05/1961  Substance and Sexual Activity  . Alcohol use: No  . Drug use: No  . Sexual activity: Not on file  Other Topics Concern  . Not on file  Social History Narrative   Retired, Married, original from [place], lives w/ wife     has two children, lost a son , has a daughter in [place]         Family History  Problem Relation Age of Onset  . Diabetes Brother   . Heart attack Brother 56  . Liver cancer Brother   . Colon cancer Neg Hx   . Prostate cancer Neg Hx     BP 128/65   Pulse 60   Ht 5\' 5"  (1.651 m)   Wt 130 lb (59 kg)   BMI 21.63 kg/m   Review of Systems: See HPI above.     Objective:  Physical Exam:  Gen: NAD, comfortable in exam room.  Left leg: No gross deformity, swelling, muscle defect. No TTP currently. FROM knee and hip with 5/5 strength. NVI distally.  Right leg: FROM without pain.   Assessment & Plan:  1. Left quad strain - grade 2, now 4 weeks out.  Clinically improved now.  Encouraged to do home exercises for 2 more weeks then can discontinue.  Tylenol, heat if needed.  aspercreme if needed.  F/u prn.

## 2016-11-11 NOTE — Assessment & Plan Note (Signed)
grade 2, now 4 weeks out.  Clinically improved now.  Encouraged to do home exercises for 2 more weeks then can discontinue.  Tylenol, heat if needed.  aspercreme if needed.  F/u prn.

## 2016-11-19 ENCOUNTER — Ambulatory Visit: Payer: Medicare Other | Admitting: Podiatry

## 2016-11-27 ENCOUNTER — Other Ambulatory Visit: Payer: Self-pay | Admitting: Internal Medicine

## 2016-12-09 ENCOUNTER — Ambulatory Visit: Payer: Medicare Other | Admitting: Internal Medicine

## 2016-12-11 ENCOUNTER — Ambulatory Visit (INDEPENDENT_AMBULATORY_CARE_PROVIDER_SITE_OTHER): Payer: Medicare Other | Admitting: Internal Medicine

## 2016-12-11 ENCOUNTER — Encounter: Payer: Self-pay | Admitting: Internal Medicine

## 2016-12-11 VITALS — BP 124/72 | HR 59 | Temp 98.2°F | Resp 14 | Ht 65.0 in | Wt 136.1 lb

## 2016-12-11 DIAGNOSIS — F411 Generalized anxiety disorder: Secondary | ICD-10-CM

## 2016-12-11 DIAGNOSIS — I10 Essential (primary) hypertension: Secondary | ICD-10-CM | POA: Diagnosis not present

## 2016-12-11 DIAGNOSIS — H9193 Unspecified hearing loss, bilateral: Secondary | ICD-10-CM | POA: Diagnosis not present

## 2016-12-11 MED ORDER — FLUOXETINE HCL 20 MG PO TABS: 20.0000 mg | ORAL_TABLET | Freq: Every day | ORAL | 1 refills | Status: DC

## 2016-12-11 NOTE — Progress Notes (Signed)
Pre visit review using our clinic review tool, if applicable. No additional management support is needed unless otherwise documented below in the visit note. 

## 2016-12-11 NOTE — Patient Instructions (Addendum)
  GO TO THE FRONT DESK Schedule your next appointment for a  Routine check up in 3 to 4 months   Increase fluoxetine to 20 mg  Use Flonase OTC: 2 sprays on each side of the nose every night

## 2016-12-11 NOTE — Progress Notes (Signed)
Subjective:    Patient ID: [name], male    DOB: November 07, 1926, 83 y.o.   MRN: [id number]  DOS:  12/11/2016 Type of visit - description : rov Interval history: HTN: Good compliance of medication, ambulatory BPs better.  Diastolic BP slightly low, denies feeling weak, dizzy or orthostatic Anxiety: Started fluoxetine, no apparent side effects, has not helped much. GERD: Symptoms better controlled with pantoprazole Wife is here, she reports a long history of HOH, will talk about a referral.   Review of Systems He reports snoring, this is a chronic issue, he mentioned had surgery for snoring years ago, denies lack of energy or feeling sleepy throughout the day.  Also reports nasal congestion.   Past Medical History:  Diagnosis Date  . Allergic rhinitis   . Anemia   . Anxiety   . BPH (benign prostatic hypertrophy)    with weak urinary stream  . Cholecystoduodenal fistula 2016   Dr. Georgette Dover  . Common bile duct stone   . Diabetes mellitus   . Elevated PSA   . Esophageal stricture   . GERD (gastroesophageal reflux disease)    w/ esoph stricture  . Hiatal hernia   . History of colonic polyps   . Hyperlipidemia   . Hypertension   . Inguinal hernia 2016   Dr. Georgette Dover, Pt does not want to proceed with repair at this time  . Internal hemorrhoids   . Osteoarthritis   . Polyneuropathy    NCS by neurology per pt 5/07  . Stroke [facility])    CVA, per CT "old stroke"    Past Surgical History:  Procedure Laterality Date  . CARDIOVASCULAR STRESS TEST  02-2010   done in [place], had CP, +EKG changes, neg nuclear  imagins  . CATARACT EXTRACTION Bilateral 01/2009  . CHOLECYSTECTOMY N/A 08/16/2013   Procedure: LAPAROSCOPIC CONVERTED TO OPEN  CHOLECYSTECTOMY WITH INTRAOPERATIVE CHOLANGIOGRAM;  Surgeon: Imogene Burn. Georgette Dover, MD;  Location: [place];  Service: General;  Laterality: N/A;  . ENT  surgery for snoring remotely (Dr Lucia Gaskins)    . ERCP W/ SPHICTEROTOMY     ????? Dr Deatra Ina  . HERNIA REPAIR     bil inguinal- per pt L in 90s, R in the 80s  . TOTAL KNEE ARTHROPLASTY Right 05/2006  . TOTAL KNEE ARTHROPLASTY Left 08/2006    Social History   Socioeconomic History  . Marital status: Married    Spouse name: Not on file  . Number of children: 2  . Years of education: Not on file  . Highest education level: Not on file  Social Needs  . Financial resource strain: Not on file  . Food insecurity - worry: Not on file  . Food insecurity - inability: Not on file  . Transportation needs - medical: Not on file  . Transportation needs - non-medical: Not on file  Occupational History  . Occupation: retired  Tobacco Use  . Smoking status: Former Research scientist (life sciences)  . Smokeless tobacco: Former Systems developer    Quit date: 01/05/1961  Substance and Sexual Activity  . Alcohol use: No  . Drug use: No  . Sexual activity: Not on file  Other Topics Concern  . Not on file  Social History Narrative   Retired, Married, original from [place], lives w/ wife     has two children, lost a son , has a daughter in [place]           Allergies as of 12/11/2016      Reactions  Amoxicillin Other (See Comments)   "loose motions" "giddiness" Has patient had a PCN reaction causing immediate rash, facial/tongue/throat swelling, SOB or lightheadedness with hypotension: No Has patient had a PCN reaction causing severe rash involving mucus membranes or skin necrosis: No Has patient had a PCN reaction that required hospitalization No Has patient had a PCN reaction occurring within the last 10 years: No If all of the above answers are "NO", then may proceed with Cephalosporin use.      Medication List        Accurate as of 12/11/16 11:59 PM. Always use your most recent med list.          alfuzosin 10 MG 24 hr tablet Commonly known as:  UROXATRAL Take 5 mg by mouth at bedtime.   AMBULATORY NON FORMULARY MEDICATION Medication Name: Abdominal Binder Dx: Ventral Hernia (K43.9)   amLODipine 5 MG tablet Commonly  known as:  NORVASC Take 1 tablet (5 mg total) by mouth daily.   aspirin EC 81 MG tablet Take 81 mg by mouth daily.   atenolol 50 MG tablet Commonly known as:  TENORMIN Take 0.5 tablets (25 mg total) by mouth daily.   CENTRUM SILVER tablet Take 1 tablet by mouth daily.   finasteride 5 MG tablet Commonly known as:  PROSCAR Take 1 tablet (5 mg total) by mouth daily.   FLUoxetine 20 MG tablet Commonly known as:  PROZAC Take 1 tablet (20 mg total) by mouth daily.   losartan 50 MG tablet Commonly known as:  COZAAR Take 1 tablet (50 mg total) by mouth daily.   pantoprazole 40 MG tablet Commonly known as:  PROTONIX Take 1 tablet (40 mg total) by mouth daily.   simvastatin 10 MG tablet Commonly known as:  ZOCOR Take 1 tablet (10 mg total) by mouth at bedtime.          Objective:   Physical Exam BP 124/72 (BP Location: Right Arm, Patient Position: Sitting, Cuff Size: Small)   Pulse (!) 59   Temp 98.2 F (36.8 C) (Oral)   Resp 14   Ht 5\' 5"  (1.651 m)   Wt 136 lb 2 oz (61.7 kg)   SpO2 97%   BMI 22.65 kg/m  General:   Well developed, well nourished . NAD.  HEENT:  Normocephalic . Face symmetric, atraumatic HOH Skin: Not pale. Not jaundice Neurologic:  alert & oriented X3.  Speech normal, gait appropriate for age and unassisted Psych--  Cognition and judgment appear intact.  Cooperative with normal attention span and concentration.  Behavior appropriate. No anxious or depressed appearing.      Assessment & Plan:   Assessment  DM Polyneuropathy by NCS, saw neurology 2007, asx  HTN Hyperlipidemia Anxiety DJD: See surgeries GERD with esophageal stricture, HH H/o anemia  BPH, increase PSA, sees urology H/o Stroke ("old stroke" per CT) H/o  hernia, 2016, declined repair Admitted 04/2015: UTI, sepsis, Escherichia coli HOH tried 3 different hearing aids before  PLAN HTN: Currently on amlodipine, losartan.  BPs are in the 126/60s.  Occasionally DBP 54.  He  is asx, no change. GERD: See last visit, omeprazole switched to Protonix, no further breakthrough sxs.  No change. Anxiety: Started fluoxetine at the last OV, only 10 mg.  Not much help.  Increase to 20 mg. HOH: Long history of problems, previous attempts to get hearing aids fail.  I mentioned that that may cause social isolation and the wife agrees, he sometimes feels that way.  Refer to Dr.  Thornell Mule H/o snoring: s/p remote surgery, snores to some extent but denies feeling fatigued or sleepy. RTC 3-4 months

## 2016-12-13 NOTE — Assessment & Plan Note (Signed)
HTN: Currently on amlodipine, losartan.  BPs are in the 126/60s.  Occasionally DBP 54.  He is asx, no change. GERD: See last visit, omeprazole switched to Protonix, no further breakthrough sxs.  No change. Anxiety: Started fluoxetine at the last OV, only 10 mg.  Not much help.  Increase to 20 mg. HOH: Long history of problems, previous attempts to get hearing aids fail.  I mentioned that that may cause social isolation and the wife agrees, he sometimes feels that way.  Refer to Dr. Thornell Mule H/o snoring: s/p remote surgery, snores to some extent but denies feeling fatigued or sleepy. RTC 3-4 months

## 2016-12-15 ENCOUNTER — Ambulatory Visit: Payer: Medicare Other | Admitting: Internal Medicine

## 2016-12-17 ENCOUNTER — Telehealth: Payer: Self-pay | Admitting: Internal Medicine

## 2016-12-17 MED ORDER — FLUOXETINE HCL 10 MG PO TABS: 20.0000 mg | ORAL_TABLET | Freq: Every day | ORAL | 1 refills | Status: DC

## 2016-12-17 NOTE — Telephone Encounter (Signed)
Copied from [place]. Topic: Inquiry >> Dec 17, 2016  3:09 PM Conception Chancy, NT wrote: Reason for CRM: patient states he is taking Prozac 20mg  but his insurance will not cover the 20 mg tablet. Insurance will cover 10 mg tablet. So if he needs to continue taking 20 mg then he needs a RX stating take 2 10mg  tablet a day and prescribe the 10mg  tablets.

## 2016-12-17 NOTE — Telephone Encounter (Signed)
Fluoxetine 10mg  sent to [facility].

## 2016-12-17 NOTE — Telephone Encounter (Signed)
Send a new prescription: Fluoxetine 10 mg tablet: 2 tablets daily #60 and 3 refills

## 2016-12-21 ENCOUNTER — Telehealth: Payer: Self-pay | Admitting: Internal Medicine

## 2016-12-21 NOTE — Telephone Encounter (Signed)
Pharmacy is calling to get authorization for capsules- the tablets are not available. They also want to know if they can give a 20 mg capsule instead of 10 mg.  OK- for capsule and 20 mg- since it does not change the order.

## 2017-01-12 ENCOUNTER — Encounter: Payer: Self-pay | Admitting: Podiatry

## 2017-01-12 ENCOUNTER — Ambulatory Visit (INDEPENDENT_AMBULATORY_CARE_PROVIDER_SITE_OTHER): Payer: Medicare Other | Admitting: Podiatry

## 2017-01-12 DIAGNOSIS — B351 Tinea unguium: Secondary | ICD-10-CM | POA: Diagnosis not present

## 2017-01-12 DIAGNOSIS — E1142 Type 2 diabetes mellitus with diabetic polyneuropathy: Secondary | ICD-10-CM

## 2017-01-12 DIAGNOSIS — Q828 Other specified congenital malformations of skin: Secondary | ICD-10-CM | POA: Diagnosis not present

## 2017-01-12 DIAGNOSIS — M79676 Pain in unspecified toe(s): Secondary | ICD-10-CM

## 2017-01-12 NOTE — Progress Notes (Signed)
He presents today chief complaint of painful toenails and calluses bilateral.  Objective: Pulses remain palpable painful elongated toenails 1 through 5 bilateral multiple calluses lesions plantar aspect of the foot.  Assessment: Pain in limb secondary to onychomycosis and porokeratosis.  Plan: Debridement of all reactive hyperkeratotic tissue and debridement of all thick dystrophic toenails.

## 2017-02-09 ENCOUNTER — Telehealth: Payer: Self-pay

## 2017-02-09 NOTE — Telephone Encounter (Signed)
Copied from [place] [id number]. Topic: Referral - Status >> Feb 09, 2017  9:46 AM Arletha Grippe wrote: Reason for CRM: pt calling about his audiologist referral. He said he can not think of the name of the practice or the phone number. All he knows is it is on [address], his appt is in February.  He is asking for follow up call. Please call [phone]

## 2017-02-09 NOTE — Telephone Encounter (Signed)
Informed Pt that ENT referral was placed to Dr. Thornell Mule on [facility] in [place]- number for their office given as well as address. Pt verbalized understanding.

## 2017-02-11 DIAGNOSIS — H903 Sensorineural hearing loss, bilateral: Secondary | ICD-10-CM | POA: Diagnosis not present

## 2017-03-01 DIAGNOSIS — H903 Sensorineural hearing loss, bilateral: Secondary | ICD-10-CM | POA: Diagnosis not present

## 2017-04-08 ENCOUNTER — Encounter: Payer: Self-pay | Admitting: Internal Medicine

## 2017-04-08 ENCOUNTER — Encounter (INDEPENDENT_AMBULATORY_CARE_PROVIDER_SITE_OTHER): Payer: Self-pay

## 2017-04-08 ENCOUNTER — Ambulatory Visit: Payer: Medicare Other | Admitting: Internal Medicine

## 2017-04-08 VITALS — BP 130/56 | HR 60 | Ht 65.0 in | Wt 130.8 lb

## 2017-04-08 DIAGNOSIS — F411 Generalized anxiety disorder: Secondary | ICD-10-CM | POA: Diagnosis not present

## 2017-04-08 DIAGNOSIS — K219 Gastro-esophageal reflux disease without esophagitis: Secondary | ICD-10-CM | POA: Diagnosis not present

## 2017-04-08 DIAGNOSIS — R1013 Epigastric pain: Secondary | ICD-10-CM

## 2017-04-08 DIAGNOSIS — R140 Abdominal distension (gaseous): Secondary | ICD-10-CM

## 2017-04-08 NOTE — Patient Instructions (Signed)
If you are age 82 or older, your body mass index should be between 23-30. Your Body mass index is 21.77 kg/m. If this is out of the aforementioned range listed, please consider follow up with your Primary Care Provider.  If you are age 31 or younger, your body mass index should be between 19-25. Your Body mass index is 21.77 kg/m. If this is out of the aformentioned range listed, please consider follow up with your Primary Care Provider.   Follow up in one year.   Thank you for choosing [facility]  Dr Scarlette Shorts

## 2017-04-08 NOTE — Progress Notes (Signed)
HISTORY OF PRESENT ILLNESS:  [name] is a pleasant 84 y.o. male with past medical history as listed below who has been followed in this office for chronic GERD, history of esophageal stricture requiring dilation in 9323, complicated gallbladder surgery from which he has recovered. Incisional hernia, and diminutive adenoma in 2008. Aged out of surveillance. He presents today with his wife. Multiple concerns, many of which are non-GI. He continues on pantoprazole 40 mg daily. Good control of reflux. He does report some problems with postprandial bloating. No weight change. Regular bowel habits. No bleeding. GI review of systems otherwise negative. Apparently has been having problems with anxiety. Trouble staying home alone. Was on Prozac 10 mg, now 20 mg. Has questions about his various medications many of which are non-GI. Wishes a comprehensive evaluation.  REVIEW OF SYSTEMS:  All non-GI ROS negative unless otherwise stated in the history of present illness except for anxiety  Past Medical History:  Diagnosis Date  . Allergic rhinitis   . Anemia   . Anxiety   . BPH (benign prostatic hypertrophy)    with weak urinary stream  . Cholecystoduodenal fistula 2016   Dr. Georgette Dover  . Common bile duct stone   . Diabetes mellitus   . Elevated PSA   . Esophageal stricture   . GERD (gastroesophageal reflux disease)    w/ esoph stricture  . Hiatal hernia   . History of colonic polyps   . Hyperlipidemia   . Hypertension   . Inguinal hernia 2016   Dr. Georgette Dover, Pt does not want to proceed with repair at this time  . Internal hemorrhoids   . Osteoarthritis   . Polyneuropathy    NCS by neurology per pt 5/07  . Stroke [facility])    CVA, per CT "old stroke"    Past Surgical History:  Procedure Laterality Date  . CARDIOVASCULAR STRESS TEST  02-2010   done in [place], had CP, +EKG changes, neg nuclear  imagins  . CATARACT EXTRACTION Bilateral 01/2009  . CATARACT EXTRACTION, BILATERAL    . CHOLECYSTECTOMY  N/A 08/16/2013   Procedure: LAPAROSCOPIC CONVERTED TO OPEN  CHOLECYSTECTOMY WITH INTRAOPERATIVE CHOLANGIOGRAM;  Surgeon: Imogene Burn. Georgette Dover, MD;  Location: [facility];  Service: General;  Laterality: N/A;  . ENT  surgery for snoring remotely (Dr Lucia Gaskins)    . ERCP W/ SPHICTEROTOMY     ????? Dr Deatra Ina  . HERNIA REPAIR     bil inguinal- per pt L in 90s, R in the 80s  . TOTAL KNEE ARTHROPLASTY Right 05/2006  . TOTAL KNEE ARTHROPLASTY Left 08/2006    Social History [name]  reports that he has quit smoking. He quit smokeless tobacco use about 56 years ago. He reports that he does not drink alcohol or use drugs.  family history includes Diabetes in his brother; Heart attack (age of onset: 13) in his brother; Liver cancer in his brother.  Allergies  Allergen Reactions  . Amoxicillin Other (See Comments)    "loose motions" "giddiness" Has patient had a PCN reaction causing immediate rash, facial/tongue/throat swelling, SOB or lightheadedness with hypotension: No Has patient had a PCN reaction causing severe rash involving mucus membranes or skin necrosis: No Has patient had a PCN reaction that required hospitalization No Has patient had a PCN reaction occurring within the last 10 years: No If all of the above answers are "NO", then may proceed with Cephalosporin use.        PHYSICAL EXAMINATION: Vital signs: BP (!) 130/56  Pulse 60   Ht 5\' 5"  (1.651 m)   Wt 130 lb 12.8 oz (59.3 kg)   BMI 21.77 kg/m   Constitutional: generally well-appearing, no acute distress Psychiatric: alert and oriented x3, cooperative Eyes: extraocular movements intact, anicteric, conjunctiva pink Mouth: oral pharynx moist, no lesions Neck: supple no lymphadenopathy Cardiovascular: heart regular rate and rhythm, no murmur Lungs: clear to auscultation bilaterally Abdomen: soft, nontender, nondistended, no obvious ascites, no peritoneal signs, normal bowel sounds, no organomegaly. Stable large ventral hernia  without complicating features Rectal:omitted Extremities: no lower extremity edema bilaterally Skin: no lesions on visible extremities Neuro: No focal deficits. Cranial nerves intact  ASSESSMENT:  #1. GERD. History of peptic stricture. Asymptomatic post dilation on PPI #2. Minor postprandial bloating #3. Anxiety #4. Ventral hernia. Stable #5. History of adenomatous colon polyp. Aged out a surveillance  PLAN:  #1. Reflux precautions #2. Continue pantoprazole 40 mg daily #3. Continue to work with PCP on anxiety #4. GI office follow-up one year   25 minutes spent face-to-face with the patient. Greater than 50% a time use for counseling regarding his chronic GERD, medications, minor dyspepsia, and issues with anxiety and how they may relate to GI symptoms. Multiple questions answered.

## 2017-04-13 ENCOUNTER — Encounter: Payer: Self-pay | Admitting: Podiatry

## 2017-04-13 ENCOUNTER — Ambulatory Visit: Payer: Medicare Other | Admitting: Podiatry

## 2017-04-13 DIAGNOSIS — B351 Tinea unguium: Secondary | ICD-10-CM

## 2017-04-13 DIAGNOSIS — E1142 Type 2 diabetes mellitus with diabetic polyneuropathy: Secondary | ICD-10-CM | POA: Diagnosis not present

## 2017-04-13 DIAGNOSIS — M79676 Pain in unspecified toe(s): Secondary | ICD-10-CM | POA: Diagnosis not present

## 2017-04-13 DIAGNOSIS — Q828 Other specified congenital malformations of skin: Secondary | ICD-10-CM | POA: Diagnosis not present

## 2017-04-13 NOTE — Progress Notes (Signed)
Presents today with a chief complaint of painful elongated toenails with calluses to the second distal toe.  He states that really bothers him when he walks.  Objective: History of diabetes mellitus with diabetic peripheral neuropathy seems that his neuropathy has progressed.  Pulses remain palpable venous insufficiency is noted with multiple varicosities.  Toenails are long thick yellow dystrophic-like mycotic hammertoe deformity second right.  Distal clavus is noted.  No ulcerations no open lesions or wounds.  Assessment: Pain in limb secondary to onychomycosis and distal clavus.  Plan: Debridement of toenails bilateral.  Debridement of all reactive hyperkeratosis placed silicone padding.

## 2017-04-14 DIAGNOSIS — R972 Elevated prostate specific antigen [PSA]: Secondary | ICD-10-CM | POA: Diagnosis not present

## 2017-04-14 LAB — PSA: PSA: 15.7

## 2017-04-16 ENCOUNTER — Ambulatory Visit: Payer: Medicare Other | Admitting: Internal Medicine

## 2017-04-19 DIAGNOSIS — R3912 Poor urinary stream: Secondary | ICD-10-CM | POA: Diagnosis not present

## 2017-04-19 DIAGNOSIS — N401 Enlarged prostate with lower urinary tract symptoms: Secondary | ICD-10-CM | POA: Diagnosis not present

## 2017-04-19 DIAGNOSIS — R972 Elevated prostate specific antigen [PSA]: Secondary | ICD-10-CM | POA: Diagnosis not present

## 2017-04-19 MED FILL — TAMSULOSIN HCL 0.4 MG CAP: 0.4 | 30 days supply | Qty: 30 | Fill #0

## 2017-04-26 ENCOUNTER — Other Ambulatory Visit: Payer: Self-pay | Admitting: Internal Medicine

## 2017-04-27 ENCOUNTER — Encounter: Payer: Self-pay | Admitting: Internal Medicine

## 2017-04-30 ENCOUNTER — Other Ambulatory Visit: Payer: Self-pay

## 2017-04-30 ENCOUNTER — Encounter: Payer: Self-pay | Admitting: Internal Medicine

## 2017-04-30 ENCOUNTER — Ambulatory Visit (INDEPENDENT_AMBULATORY_CARE_PROVIDER_SITE_OTHER): Payer: Medicare Other | Admitting: Internal Medicine

## 2017-04-30 VITALS — BP 122/60 | HR 58 | Temp 97.8°F | Resp 16 | Ht 65.0 in | Wt 133.4 lb

## 2017-04-30 DIAGNOSIS — G629 Polyneuropathy, unspecified: Secondary | ICD-10-CM | POA: Diagnosis not present

## 2017-04-30 DIAGNOSIS — R739 Hyperglycemia, unspecified: Secondary | ICD-10-CM

## 2017-04-30 DIAGNOSIS — I10 Essential (primary) hypertension: Secondary | ICD-10-CM

## 2017-04-30 DIAGNOSIS — E785 Hyperlipidemia, unspecified: Secondary | ICD-10-CM | POA: Diagnosis not present

## 2017-04-30 MED ORDER — FINASTERIDE 5 MG PO TABS: 5.0000 mg | ORAL_TABLET | Freq: Every day | ORAL | 1 refills | Status: DC

## 2017-04-30 MED ORDER — SIMVASTATIN 10 MG PO TABS: 10.0000 mg | ORAL_TABLET | Freq: Every day | ORAL | 1 refills | Status: DC

## 2017-04-30 MED ORDER — FLUOXETINE HCL 20 MG PO CAPS: 20.0000 mg | ORAL_CAPSULE | Freq: Every day | ORAL | 1 refills | Status: DC

## 2017-04-30 MED ORDER — LOSARTAN POTASSIUM 50 MG PO TABS: 50.0000 mg | ORAL_TABLET | Freq: Every day | ORAL | 1 refills | Status: DC

## 2017-04-30 MED ORDER — ATENOLOL 50 MG PO TABS: 25.0000 mg | ORAL_TABLET | Freq: Every day | ORAL | 1 refills | Status: DC

## 2017-04-30 MED ORDER — AMLODIPINE BESYLATE 5 MG PO TABS: 5.0000 mg | ORAL_TABLET | Freq: Every day | ORAL | 1 refills | Status: DC

## 2017-04-30 MED ORDER — TAMSULOSIN HCL 0.4 MG PO CAPS: 0.4000 mg | ORAL_CAPSULE | Freq: Every day | ORAL | 0 refills | Status: DC

## 2017-04-30 MED ORDER — PANTOPRAZOLE SODIUM 40 MG PO TBEC: 40.0000 mg | DELAYED_RELEASE_TABLET | Freq: Every day | ORAL | 1 refills | Status: DC

## 2017-04-30 NOTE — Patient Instructions (Addendum)
GO TO THE LAB : Get the blood work     GO TO THE FRONT DESK Schedule your next appointment for a physical exam by October 2019    Diabetes and Foot Care Diabetes may cause you to have problems because of poor blood supply (circulation) to your feet and legs. This may cause the skin on your feet to become thinner, break easier, and heal more slowly. Your skin may become dry, and the skin may peel and crack. You may also have nerve damage in your legs and feet causing decreased feeling in them. You may not notice minor injuries to your feet that could lead to infections or more serious problems. Taking care of your feet is one of the most important things you can do for yourself. Follow these instructions at home:  Wear shoes at all times, even in the house. Do not go barefoot. Bare feet are easily injured.  Check your feet daily for blisters, cuts, and redness. If you cannot see the bottom of your feet, use a mirror or ask someone for help.  Wash your feet with warm water (do not use hot water) and mild soap. Then pat your feet and the areas between your toes until they are completely dry. Do not soak your feet as this can dry your skin.  Apply a moisturizing lotion or petroleum jelly (that does not contain alcohol and is unscented) to the skin on your feet and to dry, brittle toenails. Do not apply lotion between your toes.  Trim your toenails straight across. Do not dig under them or around the cuticle. File the edges of your nails with an emery board or nail file.  Do not cut corns or calluses or try to remove them with medicine.  Wear clean socks or stockings every day. Make sure they are not too tight. Do not wear knee-high stockings since they may decrease blood flow to your legs.  Wear shoes that fit properly and have enough cushioning. To break in new shoes, wear them for just a few hours a day. This prevents you from injuring your feet. Always look in your shoes before you put them  on to be sure there are no objects inside.  Do not cross your legs. This may decrease the blood flow to your feet.  If you find a minor scrape, cut, or break in the skin on your feet, keep it and the skin around it clean and dry. These areas may be cleansed with mild soap and water. Do not cleanse the area with peroxide, alcohol, or iodine.  When you remove an adhesive bandage, be sure not to damage the skin around it.  If you have a wound, look at it several times a day to make sure it is healing.  Do not use heating pads or hot water bottles. They may burn your skin. If you have lost feeling in your feet or legs, you may not know it is happening until it is too late.  Make sure your health care provider performs a complete foot exam at least annually or more often if you have foot problems. Report any cuts, sores, or bruises to your health care provider immediately. Contact a health care provider if:  You have an injury that is not healing.  You have cuts or breaks in the skin.  You have an ingrown nail.  You notice redness on your legs or feet.  You feel burning or tingling in your legs or feet.  You have pain or cramps in your legs and feet.  Your legs or feet are numb.  Your feet always feel cold. Get help right away if:  There is increasing redness, swelling, or pain in or around a wound.  There is a red line that goes up your leg.  Pus is coming from a wound.  You develop a fever or as directed by your health care provider.  You notice a bad smell coming from an ulcer or wound. This information is not intended to replace advice given to you by your health care provider. Make sure you discuss any questions you have with your health care provider. Document Released: 12/20/1999 Document Revised: 05/30/2015 Document Reviewed: 05/31/2012 Elsevier Interactive Patient Education  2017 [employer].

## 2017-04-30 NOTE — Progress Notes (Signed)
Pre visit review using our clinic review tool, if applicable. No additional management support is needed unless otherwise documented below in the visit note. 

## 2017-04-30 NOTE — Progress Notes (Signed)
Subjective:    Patient ID: [name], male    DOB: 21-Feb-1926, 84 y.o.   MRN: [id number]  DOS:  04/30/2017 Type of visit - description : rov Interval history: Complaining of allergies for few week: Mild cough, stuffy nose. HTN: Good compliance with medication, ambulatory BPs always less than 130. Prediabetes: Doing well with diet, no ambulatory CBGs History of neuropathy: Reports occasional numbness in the toes bilaterally, denies burning feeling on the feet. Note from GI reviewed   Review of Systems Denies fever chills. Occasional sneezing. No sputum production  Past Medical History:  Diagnosis Date  . Allergic rhinitis   . Anemia   . Anxiety   . BPH (benign prostatic hypertrophy)    with weak urinary stream  . Cholecystoduodenal fistula 2016   Dr. Georgette Dover  . Common bile duct stone   . Diabetes mellitus   . Elevated PSA   . Esophageal stricture   . GERD (gastroesophageal reflux disease)    w/ esoph stricture  . Hiatal hernia   . History of colonic polyps   . Hyperlipidemia   . Hypertension   . Inguinal hernia 2016   Dr. Georgette Dover, Pt does not want to proceed with repair at this time  . Internal hemorrhoids   . Osteoarthritis   . Polyneuropathy    NCS by neurology per pt 5/07  . Stroke [facility])    CVA, per CT "old stroke"    Past Surgical History:  Procedure Laterality Date  . CARDIOVASCULAR STRESS TEST  02-2010   done in [place], had CP, +EKG changes, neg nuclear  imagins  . CATARACT EXTRACTION Bilateral 01/2009  . CATARACT EXTRACTION, BILATERAL    . CHOLECYSTECTOMY N/A 08/16/2013   Procedure: LAPAROSCOPIC CONVERTED TO OPEN  CHOLECYSTECTOMY WITH INTRAOPERATIVE CHOLANGIOGRAM;  Surgeon: Imogene Burn. Georgette Dover, MD;  Location: [place];  Service: General;  Laterality: N/A;  . ENT  surgery for snoring remotely (Dr Lucia Gaskins)    . ERCP W/ SPHICTEROTOMY     ????? Dr Deatra Ina  . HERNIA REPAIR     bil inguinal- per pt L in 90s, R in the 80s  . TOTAL KNEE ARTHROPLASTY Right 05/2006  .  TOTAL KNEE ARTHROPLASTY Left 08/2006    Social History   Socioeconomic History  . Marital status: Married    Spouse name: Not on file  . Number of children: 2  . Years of education: Not on file  . Highest education level: Not on file  Occupational History  . Occupation: retired  Scientific laboratory technician  . Financial resource strain: Not on file  . Food insecurity:    Worry: Not on file    Inability: Not on file  . Transportation needs:    Medical: Not on file    Non-medical: Not on file  Tobacco Use  . Smoking status: Former Research scientist (life sciences)  . Smokeless tobacco: Former Systems developer    Quit date: 01/05/1961  Substance and Sexual Activity  . Alcohol use: No  . Drug use: No  . Sexual activity: Not on file  Lifestyle  . Physical activity:    Days per week: Not on file    Minutes per session: Not on file  . Stress: Not on file  Relationships  . Social connections:    Talks on phone: Not on file    Gets together: Not on file    Attends religious service: Not on file    Active member of club or organization: Not on file    Attends meetings  of clubs or organizations: Not on file    Relationship status: Not on file  . Intimate partner violence:    Fear of current or ex partner: Not on file    Emotionally abused: Not on file    Physically abused: Not on file    Forced sexual activity: Not on file  Other Topics Concern  . Not on file  Social History Narrative   Retired, Married, original from [place], lives w/ wife     has two children, lost a son , has a daughter in [place]           Allergies as of 04/30/2017      Reactions   Amoxicillin Other (See Comments)   "loose motions" "giddiness" Has patient had a PCN reaction causing immediate rash, facial/tongue/throat swelling, SOB or lightheadedness with hypotension: No Has patient had a PCN reaction causing severe rash involving mucus membranes or skin necrosis: No Has patient had a PCN reaction that required hospitalization No Has patient had  a PCN reaction occurring within the last 10 years: No If all of the above answers are "NO", then may proceed with Cephalosporin use.      Medication List        Accurate as of 04/30/17 11:59 PM. Always use your most recent med list.          AMBULATORY NON FORMULARY MEDICATION Medication Name: Abdominal Binder Dx: Ventral Hernia (K43.9)   amLODipine 5 MG tablet Commonly known as:  NORVASC Take 1 tablet (5 mg total) by mouth daily.   aspirin EC 81 MG tablet Take 81 mg by mouth daily.   atenolol 50 MG tablet Commonly known as:  TENORMIN Take 0.5 tablets (25 mg total) by mouth daily.   CENTRUM SILVER tablet Take 1 tablet by mouth daily.   finasteride 5 MG tablet Commonly known as:  PROSCAR Take 1 tablet (5 mg total) by mouth daily.   FLUoxetine 20 MG capsule Commonly known as:  PROZAC Take 1 capsule (20 mg total) by mouth daily.   losartan 50 MG tablet Commonly known as:  COZAAR Take 1 tablet (50 mg total) by mouth daily.   pantoprazole 40 MG tablet Commonly known as:  PROTONIX Take 1 tablet (40 mg total) by mouth daily.   simvastatin 10 MG tablet Commonly known as:  ZOCOR Take 1 tablet (10 mg total) by mouth at bedtime.   tamsulosin 0.4 MG Caps capsule Commonly known as:  FLOMAX Take 1 capsule (0.4 mg total) by mouth at bedtime.          Objective:   Physical Exam BP 122/60 (BP Location: Left Arm, Patient Position: Sitting, Cuff Size: Small)   Pulse (!) 58   Temp 97.8 F (36.6 C) (Oral)   Resp 16   Ht 5\' 5"  (1.651 m)   Wt 133 lb 6 oz (60.5 kg)   SpO2 98%   BMI 22.19 kg/m  General:   Well developed, well nourished . NAD.  HEENT:  Normocephalic . Face symmetric, atraumatic.  Nose is slightly congested Lungs:  CTA B Normal respiratory effort, no intercostal retractions, no accessory muscle use. Heart: RRR,  no murmur.  No pretibial edema bilaterally  Skin: Not pale. Not jaundice Neurologic:  alert & oriented X3.  Speech normal, gait  appropriate for age and unassisted Psych--  Cognition and judgment appear intact.  Cooperative with normal attention span and concentration.  Behavior appropriate. No anxious or depressed appearing.      Assessment &  Plan:   Assessment  DM Polyneuropathy by NCS, saw neurology 2007  HTN Hyperlipidemia Anxiety DJD: See surgeries GERD with esophageal stricture, HH H/o anemia  BPH, increase PSA, sees urology H/o Stroke ("old stroke" per CT) H/o  hernia, 2016, declined repair Admitted 04/2015: UTI, sepsis, Escherichia coli HOH tried 3 different hearing aids before  PLAN Hyperglycemia: Check A1c, diet controlled  Polyneuropathy: Currently sxs: numbness distally.  Feet care discussed. HTN: On amlodipine, Tenormin, losartan.  Check BMP Hyperlipidemia: On simvastatin, check AST, ALT, FLP (not fasting) GERD: Note from GI reviewed, stable Allergies: Continue Flonase. BPH: Request refill on Flomax, will do. Needs all meds refilled 90 days and 1 refill. Declined PNM 23 booster RTC CPX 10-2017

## 2017-05-02 DIAGNOSIS — G629 Polyneuropathy, unspecified: Secondary | ICD-10-CM | POA: Insufficient documentation

## 2017-05-02 NOTE — Assessment & Plan Note (Signed)
Hyperglycemia: Check A1c, diet controlled  Polyneuropathy: Currently sxs: numbness distally.  Feet care discussed. HTN: On amlodipine, Tenormin, losartan.  Check BMP Hyperlipidemia: On simvastatin, check AST, ALT, FLP (not fasting) GERD: Note from GI reviewed, stable Allergies: Continue Flonase. BPH: Request refill on Flomax, will do. Needs all meds refilled 90 days and 1 refill. Declined PNM 23 booster RTC CPX 10-2017

## 2017-05-03 ENCOUNTER — Other Ambulatory Visit (INDEPENDENT_AMBULATORY_CARE_PROVIDER_SITE_OTHER): Payer: Medicare Other

## 2017-05-03 DIAGNOSIS — E782 Mixed hyperlipidemia: Secondary | ICD-10-CM | POA: Diagnosis not present

## 2017-05-03 LAB — LIPID PANEL
Cholesterol: 107 mg/dL (ref 0–200)
HDL: 40.4 mg/dL (ref >39.00)
LDL Cholesterol: 37 mg/dL (ref 0–99)
NonHDL: 67
Total CHOL/HDL Ratio: 3
Triglycerides: 151 mg/dL — ABNORMAL HIGH (ref 0.0–149.0)
VLDL: 30.2 mg/dL (ref 0.0–40.0)

## 2017-05-03 LAB — BASIC METABOLIC PANEL
BUN: 15 mg/dL (ref 6–23)
CO2: 28 mEq/L (ref 19–32)
Calcium: 9.3 mg/dL (ref 8.4–10.5)
Chloride: 99 mEq/L (ref 96–112)
Creatinine, Ser: 1.13 mg/dL (ref 0.40–1.50)
GFR: 64.97 mL/min (ref >60.00)
Glucose, Bld: 116 mg/dL — ABNORMAL HIGH (ref 70–99)
Potassium: 4.1 mEq/L (ref 3.5–5.1)
Sodium: 134 mEq/L — ABNORMAL LOW (ref 135–145)

## 2017-05-03 LAB — AST: AST: 17 U/L (ref 0–37)

## 2017-05-03 LAB — ALT: ALT: 11 U/L (ref 0–53)

## 2017-05-03 LAB — HEMOGLOBIN A1C: Hgb A1c MFr Bld: 5.9 % (ref 4.6–6.5)

## 2017-06-23 ENCOUNTER — Other Ambulatory Visit: Payer: Self-pay | Admitting: Internal Medicine

## 2017-08-18 ENCOUNTER — Other Ambulatory Visit: Payer: Self-pay | Admitting: Internal Medicine

## 2017-09-14 ENCOUNTER — Encounter: Payer: Self-pay | Admitting: Podiatry

## 2017-09-14 ENCOUNTER — Ambulatory Visit (INDEPENDENT_AMBULATORY_CARE_PROVIDER_SITE_OTHER): Payer: Medicare Other | Admitting: Podiatry

## 2017-09-14 DIAGNOSIS — Q828 Other specified congenital malformations of skin: Secondary | ICD-10-CM | POA: Diagnosis not present

## 2017-09-14 DIAGNOSIS — E1142 Type 2 diabetes mellitus with diabetic polyneuropathy: Secondary | ICD-10-CM

## 2017-09-14 DIAGNOSIS — M79676 Pain in unspecified toe(s): Secondary | ICD-10-CM | POA: Diagnosis not present

## 2017-09-14 DIAGNOSIS — B351 Tinea unguium: Secondary | ICD-10-CM | POA: Diagnosis not present

## 2017-09-14 DIAGNOSIS — R2681 Unsteadiness on feet: Secondary | ICD-10-CM

## 2017-09-15 NOTE — Progress Notes (Signed)
Presents today chief concern third toe right foot states that it is painful when he walks on it because of the reactive hyperkeratosis.  He states that his nails are long and painful.  Objective: Toenails are long thick yellow dystrophic-like mycotic pulses remain palpable.  Toenails are long thick yellow dystrophic clinically mycotic plantarflexed third digit resulting in a distal clavus.  Neuropathy is present still bilateral.  Assessment: Diabetic peripheral neuropathy/idiopathic neuropathy.  Pain in limb secondary to onychomycosis.  Pain in limb secondary to mallet toe deformity with distal clavus.  Plan: Debrided reactive hyperkeratosis distal aspect of the toe debrided toenails 1 through 5.  Consider flexor tenotomy.

## 2017-09-16 DIAGNOSIS — H26493 Other secondary cataract, bilateral: Secondary | ICD-10-CM | POA: Diagnosis not present

## 2017-09-16 DIAGNOSIS — H10413 Chronic giant papillary conjunctivitis, bilateral: Secondary | ICD-10-CM | POA: Diagnosis not present

## 2017-09-16 DIAGNOSIS — H04123 Dry eye syndrome of bilateral lacrimal glands: Secondary | ICD-10-CM | POA: Diagnosis not present

## 2017-09-16 DIAGNOSIS — Z961 Presence of intraocular lens: Secondary | ICD-10-CM | POA: Diagnosis not present

## 2017-10-05 DIAGNOSIS — J310 Chronic rhinitis: Secondary | ICD-10-CM | POA: Diagnosis not present

## 2017-10-05 DIAGNOSIS — J018 Other acute sinusitis: Secondary | ICD-10-CM | POA: Diagnosis not present

## 2017-10-05 MED FILL — CEFUROXIME AXETIL 500 MG TA: 500 | 10 days supply | Qty: 10 | Fill #0

## 2017-10-07 ENCOUNTER — Telehealth: Payer: Self-pay | Admitting: *Deleted

## 2017-10-07 ENCOUNTER — Telehealth: Payer: Self-pay | Admitting: Podiatry

## 2017-10-07 NOTE — Telephone Encounter (Signed)
I'm having pain and numbness radiating from my third right toe. It's causing pain and difficulty walking. I'm diabetic.

## 2017-10-07 NOTE — Telephone Encounter (Signed)
I called pt, she states it started yesterday evening having sharp pain. I asked pt if he had any drainage, swelling and he stated no. I told pt to go into a soft topped shoe with a stiff soled bottom, take the OTC pain reliever that he can tolerate and I will call again with more instructions from Dr. Milinda Pointer.

## 2017-10-07 NOTE — Telephone Encounter (Signed)
I'm [employer] speaking.  Can you call me as soon as possible?  Thank you, please call me."

## 2017-10-07 NOTE — Telephone Encounter (Signed)
Cant you put him in on someones emergency schedule

## 2017-10-11 ENCOUNTER — Encounter: Payer: Self-pay | Admitting: Internal Medicine

## 2017-10-11 ENCOUNTER — Ambulatory Visit (INDEPENDENT_AMBULATORY_CARE_PROVIDER_SITE_OTHER): Payer: Medicare Other | Admitting: Internal Medicine

## 2017-10-11 VITALS — BP 152/68 | HR 56 | Temp 97.5°F | Resp 16 | Ht 65.0 in | Wt 137.2 lb

## 2017-10-11 DIAGNOSIS — Z0000 Encounter for general adult medical examination without abnormal findings: Secondary | ICD-10-CM

## 2017-10-11 DIAGNOSIS — Z23 Encounter for immunization: Secondary | ICD-10-CM

## 2017-10-11 LAB — BASIC METABOLIC PANEL
BUN: 16 mg/dL (ref 6–23)
CO2: 26 mEq/L (ref 19–32)
Calcium: 9 mg/dL (ref 8.4–10.5)
Chloride: 99 mEq/L (ref 96–112)
Creatinine, Ser: 1.16 mg/dL (ref 0.40–1.50)
GFR: 62.97 mL/min (ref >60.00)
Glucose, Bld: 142 mg/dL — ABNORMAL HIGH (ref 70–99)
Potassium: 4.3 mEq/L (ref 3.5–5.1)
Sodium: 131 mEq/L — ABNORMAL LOW (ref 135–145)

## 2017-10-11 LAB — CBC WITH DIFFERENTIAL/PLATELET
Basophils Absolute: 0.1 10*3/uL (ref 0.0–0.1)
Basophils Relative: 1.9 % (ref 0.0–3.0)
Eosinophils Absolute: 0.3 10*3/uL (ref 0.0–0.7)
Eosinophils Relative: 4.4 % (ref 0.0–5.0)
HCT: 35.7 % — ABNORMAL LOW (ref 39.0–52.0)
Hemoglobin: 12.5 g/dL — ABNORMAL LOW (ref 13.0–17.0)
Lymphocytes Relative: 25.7 % (ref 12.0–46.0)
Lymphs Abs: 1.7 10*3/uL (ref 0.7–4.0)
MCHC: 34.9 g/dL (ref 30.0–36.0)
MCV: 86.1 fl (ref 78.0–100.0)
Monocytes Absolute: 0.6 10*3/uL (ref 0.1–1.0)
Monocytes Relative: 9.7 % (ref 3.0–12.0)
Neutro Abs: 3.8 10*3/uL (ref 1.4–7.7)
Neutrophils Relative %: 58.3 % (ref 43.0–77.0)
Platelets: 180 10*3/uL (ref 150.0–400.0)
RBC: 4.15 Mil/uL — ABNORMAL LOW (ref 4.22–5.81)
RDW: 14.3 % (ref 11.5–15.5)
WBC: 6.5 10*3/uL (ref 4.0–10.5)

## 2017-10-11 LAB — PSA: PSA: 16.7

## 2017-10-11 LAB — TSH: TSH: 5.82 u[IU]/mL — ABNORMAL HIGH (ref 0.35–4.50)

## 2017-10-11 MED ORDER — FAMOTIDINE 40 MG PO TABS: 40.0000 mg | ORAL_TABLET | Freq: Every day | ORAL | 0 refills | Status: DC

## 2017-10-11 NOTE — Assessment & Plan Note (Signed)
Prediabetes: Last A1c satisfactory HTN: Continue Currently on amlodipine, Tenormin, losartan.  BP today 747/15, at home systolic BP 953, 967.  No change, check a BMP and a CBC Mild anemia: Per last CBC, rechecking today Hyperlipidemia: On simvastatin, last FLP satisfactory GERD: On Protonix, used to work very well until few weeks ago, has not changed his diet, not taking NSAIDs.  Continue Protonix daily, add pepcid at nighttime.  Diet discussed. Mildly increased TSH: Recheck today. Mobility: Patient is 60, doing well, having occasional problem pushing himself up from a chair, offered PT. Will call if interested RTC 6 months

## 2017-10-11 NOTE — Progress Notes (Signed)
Pre visit review using our clinic review tool, if applicable. No additional management support is needed unless otherwise documented below in the visit note. 

## 2017-10-11 NOTE — Assessment & Plan Note (Addendum)
-  Td 3-12 ; Pneumovax 2006 and 10/2017; prevnar 2015; shingles discussed before ;  flu shot today  -CCS:no longer indicated  bone Density: 10/21/2005 and  11-09, both normal- neg   Prostate cancer screening: per urology, to be seen tomorrow Counseling: diet discussed , encouraged to saty active

## 2017-10-11 NOTE — Patient Instructions (Signed)
GO TO THE LAB : Get the blood work     GO TO THE FRONT DESK Schedule your next appointment for a checkup in 6 months  Continue Protonix in the morning before breakfast Add Pepcid, 1 tablet at night x3 months Follow GERD precautions Call if not gradually improving   Check the  blood pressure 2 or 3 times a month  Be sure your blood pressure is between 110/65 and  135/85. If it is consistently higher or lower, let me know    Food Choices for Gastroesophageal Reflux Disease, Adult When you have gastroesophageal reflux disease (GERD), the foods you eat and your eating habits are very important. Choosing the right foods can help ease the discomfort of GERD. Consider working with a diet and nutrition specialist (dietitian) to help you make healthy food choices. What general guidelines should I follow? Eating plan  Choose healthy foods low in fat, such as fruits, vegetables, whole grains, low-fat dairy products, and lean meat, fish, and poultry.  Eat frequent, small meals instead of three large meals each day. Eat your meals slowly, in a relaxed setting. Avoid bending over or lying down until 2-3 hours after eating.  Limit high-fat foods such as fatty meats or fried foods.  Limit your intake of oils, butter, and shortening to less than 8 teaspoons each day.  Avoid the following: ? Foods that cause symptoms. These may be different for different people. Keep a food diary to keep track of foods that cause symptoms. ? Alcohol. ? Drinking large amounts of liquid with meals. ? Eating meals during the 2-3 hours before bed.  Cook foods using methods other than frying. This may include baking, grilling, or broiling. Lifestyle   Maintain a healthy weight. Ask your health care provider what weight is healthy for you. If you need to lose weight, work with your health care provider to do so safely.  Exercise for at least 30 minutes on 5 or more days each week, or as told by your health care  provider.  Avoid wearing clothes that fit tightly around your waist and chest.  Do not use any products that contain nicotine or tobacco, such as cigarettes and e-cigarettes. If you need help quitting, ask your health care provider.  Sleep with the head of your bed raised. Use a wedge under the mattress or blocks under the bed frame to raise the head of the bed. What foods are not recommended? The items listed may not be a complete list. Talk with your dietitian about what dietary choices are best for you. Grains Pastries or quick breads with added fat. [place] toast. Vegetables Deep fried vegetables. [place] fries. Any vegetables prepared with added fat. Any vegetables that cause symptoms. For some people this may include tomatoes and tomato products, chili peppers, onions and garlic, and horseradish. Fruits Any fruits prepared with added fat. Any fruits that cause symptoms. For some people this may include citrus fruits, such as oranges, grapefruit, pineapple, and lemons. Meats and other protein foods High-fat meats, such as fatty beef or pork, hot dogs, ribs, ham, sausage, salami and bacon. Fried meat or protein, including fried fish and fried chicken. Nuts and nut butters. Dairy Whole milk and chocolate milk. Sour cream. Cream. Ice cream. Cream cheese. Milk shakes. Beverages Coffee and tea, with or without caffeine. Carbonated beverages. Sodas. Energy drinks. Fruit juice made with acidic fruits (such as orange or grapefruit). Tomato juice. Alcoholic drinks. Fats and oils Butter. Margarine. Shortening. Ghee. Sweets and desserts  Chocolate and cocoa. Donuts. Seasoning and other foods Pepper. Peppermint and spearmint. Any condiments, herbs, or seasonings that cause symptoms. For some people, this may include curry, hot sauce, or vinegar-based salad dressings. Summary  When you have gastroesophageal reflux disease (GERD), food and lifestyle choices are very important to help ease the  discomfort of GERD.  Eat frequent, small meals instead of three large meals each day. Eat your meals slowly, in a relaxed setting. Avoid bending over or lying down until 2-3 hours after eating.  Limit high-fat foods such as fatty meat or fried foods. This information is not intended to replace advice given to you by your health care provider. Make sure you discuss any questions you have with your health care provider. Document Released: 12/22/2004 Document Revised: 12/24/2015 Document Reviewed: 12/24/2015 Elsevier Interactive Patient Education  [employer].

## 2017-10-11 NOTE — Progress Notes (Signed)
Subjective:    Patient ID: [name], male    DOB: 28-Jan-1926, 84 y.o.   MRN: [id number]  DOS:  10/11/2017 Type of visit - description : cpx Interval history: Here for CPX, he does have few concerns  BP Readings from Last 3 Encounters:  10/11/17 (!) 152/68  04/30/17 122/60  04/08/17 (!) 130/56   Review of Systems  GERD, on Protonix long-term, here lately his symptoms have resurface, mostly abdominal burning with some radiation upward.  No dysphagia or odynophagia. No SSCP, shortness of breath or edema In general, his mobility has decreased, for instance having a hard time pushing himself up from a chair.  Other than above, a 14 point review of systems is negative    Past Medical History:  Diagnosis Date  . Allergic rhinitis   . Anemia   . Anxiety   . BPH (benign prostatic hypertrophy)    with weak urinary stream  . Cholecystoduodenal fistula 2016   Dr. Georgette Dover  . Common bile duct stone   . Diabetes mellitus   . Elevated PSA   . Esophageal stricture   . GERD (gastroesophageal reflux disease)    w/ esoph stricture  . Hiatal hernia   . History of colonic polyps   . Hyperlipidemia   . Hypertension   . Inguinal hernia 2016   Dr. Georgette Dover, Pt does not want to proceed with repair at this time  . Internal hemorrhoids   . Osteoarthritis   . Polyneuropathy    NCS by neurology per pt 5/07  . Stroke [facility])    CVA, per CT "old stroke"    Past Surgical History:  Procedure Laterality Date  . CARDIOVASCULAR STRESS TEST  02-2010   done in [place], had CP, +EKG changes, neg nuclear  imagins  . CATARACT EXTRACTION Bilateral 01/2009  . CATARACT EXTRACTION, BILATERAL    . CHOLECYSTECTOMY N/A 08/16/2013   Procedure: LAPAROSCOPIC CONVERTED TO OPEN  CHOLECYSTECTOMY WITH INTRAOPERATIVE CHOLANGIOGRAM;  Surgeon: Imogene Burn. Georgette Dover, MD;  Location: [facility];  Service: General;  Laterality: N/A;  . ENT  surgery for snoring remotely (Dr Lucia Gaskins)    . ERCP W/ SPHICTEROTOMY     ????? Dr Deatra Ina  .  HERNIA REPAIR     bil inguinal- per pt L in 90s, R in the 80s  . TOTAL KNEE ARTHROPLASTY Right 05/2006  . TOTAL KNEE ARTHROPLASTY Left 08/2006   Family History  Problem Relation Age of Onset  . Diabetes Brother   . Heart attack Brother 60  . Liver cancer Brother   . Colon cancer Neg Hx   . Prostate cancer Neg Hx     Social History   Socioeconomic History  . Marital status: Married    Spouse name: Not on file  . Number of children: 2  . Years of education: Not on file  . Highest education level: Not on file  Occupational History  . Occupation: retired  Scientific laboratory technician  . Financial resource strain: Not on file  . Food insecurity:    Worry: Not on file    Inability: Not on file  . Transportation needs:    Medical: Not on file    Non-medical: Not on file  Tobacco Use  . Smoking status: Former Research scientist (life sciences)  . Smokeless tobacco: Former Systems developer    Quit date: 01/05/1961  Substance and Sexual Activity  . Alcohol use: No  . Drug use: No  . Sexual activity: Not on file  Lifestyle  . Physical activity:  Days per week: Not on file    Minutes per session: Not on file  . Stress: Not on file  Relationships  . Social connections:    Talks on phone: Not on file    Gets together: Not on file    Attends religious service: Not on file    Active member of club or organization: Not on file    Attends meetings of clubs or organizations: Not on file    Relationship status: Not on file  . Intimate partner violence:    Fear of current or ex partner: Not on file    Emotionally abused: Not on file    Physically abused: Not on file    Forced sexual activity: Not on file  Other Topics Concern  . Not on file  Social History Narrative   Retired, Married, original from [place], lives w/ wife     has two children, lost a son , has a daughter in [place]           Allergies as of 10/11/2017      Reactions   Amoxicillin Other (See Comments)   "loose motions" "giddiness" Has patient had a PCN  reaction causing immediate rash, facial/tongue/throat swelling, SOB or lightheadedness with hypotension: No Has patient had a PCN reaction causing severe rash involving mucus membranes or skin necrosis: No Has patient had a PCN reaction that required hospitalization No Has patient had a PCN reaction occurring within the last 10 years: No If all of the above answers are "NO", then may proceed with Cephalosporin use.      Medication List        Accurate as of 10/11/17  8:54 PM. Always use your most recent med list.          AMBULATORY NON FORMULARY MEDICATION Medication Name: Abdominal Binder Dx: Ventral Hernia (K43.9)   amLODipine 5 MG tablet Commonly known as:  NORVASC Take 1 tablet (5 mg total) by mouth daily.   aspirin EC 81 MG tablet Take 81 mg by mouth daily.   atenolol 50 MG tablet Commonly known as:  TENORMIN Take 0.5 tablets (25 mg total) by mouth daily.   CENTRUM SILVER tablet Take 1 tablet by mouth daily.   famotidine 40 MG tablet Commonly known as:  PEPCID Take 1 tablet (40 mg total) by mouth at bedtime.   finasteride 5 MG tablet Commonly known as:  PROSCAR Take 1 tablet (5 mg total) by mouth daily.   FLUoxetine 20 MG capsule Commonly known as:  PROZAC Take 1 capsule (20 mg total) by mouth daily.   losartan 50 MG tablet Commonly known as:  COZAAR Take 1 tablet (50 mg total) by mouth daily.   pantoprazole 40 MG tablet Commonly known as:  PROTONIX Take 1 tablet (40 mg total) by mouth daily.   simvastatin 10 MG tablet Commonly known as:  ZOCOR Take 1 tablet (10 mg total) by mouth at bedtime.   tamsulosin 0.4 MG Caps capsule Commonly known as:  FLOMAX Take 1 capsule (0.4 mg total) by mouth at bedtime.          Objective:   Physical Exam BP (!) 152/68 (BP Location: Left Arm, Patient Position: Sitting, Cuff Size: Small)   Pulse (!) 56   Temp (!) 97.5 F (36.4 C) (Oral)   Resp 16   Ht 5\' 5"  (1.651 m)   Wt 137 lb 4 oz (62.3 kg)   SpO2 98%    BMI 22.84 kg/m  General: Well  developed, NAD, see BMI.  HEENT:  Normocephalic . Face symmetric, atraumatic Lungs:  CTA B Normal respiratory effort, no intercostal retractions, no accessory muscle use. Heart: RRR,  no murmur.  No pretibial edema bilaterally  Abdomen:  Not distended, soft, non-tender. No rebound or rigidity.   Skin: Exposed areas without rash. Not pale. Not jaundice Neurologic:  alert & oriented X3.  Speech normal, gait appropriate for age and unassisted.  He needed some assistance getting up on the table, normal for age. Strength symmetric and appropriate for age.  Psych: Cognition and judgment appear intact.  Cooperative with normal attention span and concentration.  Behavior appropriate. No anxious or depressed appearing.     Assessment & Plan:   Assessment  DM Polyneuropathy by NCS, saw neurology 2007  HTN Hyperlipidemia Anxiety DJD: See surgeries GERD with esophageal stricture, HH H/o anemia  BPH, increase PSA, sees urology H/o Stroke ("old stroke" per CT) H/o  hernia, 2016, declined repair Admitted 04/2015: UTI, sepsis, Escherichia coli HOH tried 3 different hearing aids before  PLAN Prediabetes: Last A1c satisfactory HTN: Continue Currently on amlodipine, Tenormin, losartan.  BP today 409/81, at home systolic BP 191, 478.  No change, check a BMP and a CBC Mild anemia: Per last CBC, rechecking today Hyperlipidemia: On simvastatin, last FLP satisfactory GERD: On Protonix, used to work very well until few weeks ago, has not changed his diet, not taking NSAIDs.  Continue Protonix daily, add pepcid at nighttime.  Diet discussed. Mildly increased TSH: Recheck today. Mobility: Patient is 49, doing well, having occasional problem pushing himself up from a chair, offered PT. Will call if interested RTC 6 months

## 2017-10-12 DIAGNOSIS — R350 Frequency of micturition: Secondary | ICD-10-CM | POA: Diagnosis not present

## 2017-11-02 ENCOUNTER — Encounter

## 2017-11-02 ENCOUNTER — Encounter: Payer: Self-pay | Admitting: Podiatry

## 2017-11-02 ENCOUNTER — Ambulatory Visit (INDEPENDENT_AMBULATORY_CARE_PROVIDER_SITE_OTHER): Payer: Medicare Other | Admitting: Podiatry

## 2017-11-02 DIAGNOSIS — M79676 Pain in unspecified toe(s): Secondary | ICD-10-CM

## 2017-11-02 DIAGNOSIS — Q828 Other specified congenital malformations of skin: Secondary | ICD-10-CM

## 2017-11-02 DIAGNOSIS — B351 Tinea unguium: Secondary | ICD-10-CM

## 2017-11-02 DIAGNOSIS — E1142 Type 2 diabetes mellitus with diabetic polyneuropathy: Secondary | ICD-10-CM | POA: Diagnosis not present

## 2017-11-02 NOTE — Progress Notes (Signed)
He presents today chief complaint of painful elongated toenails and painful hammertoe right.  Objective: Vital signs are stable alert and oriented x3 pulses strong palpable bilateral.  Toenails are 1 through 5 are thick yellow dystrophic clinically mycotic hammertoe deformity resulting in distal clavus second digit of the right foot pre-ulcerative lesion nondiabetic.  Assessment: Pain in limb secondary to hammertoe deformity and onychomycosis.  Plan: Debrided all reactive hyperkeratosis debrided toenails 1 through 5 bilateral.  Placed buttress pad discussed appropriate shoe gear follow-up with him in 3 months

## 2017-11-25 ENCOUNTER — Telehealth: Payer: Self-pay

## 2017-11-25 NOTE — Telephone Encounter (Signed)
Author phoned [name] to review her concerns. No answer, author left generic VM asking for return call. OK for PEC to ask about and list daughter's specific concerns.

## 2017-11-25 NOTE — Telephone Encounter (Signed)
Copied from [place] [id number]. Topic: General - Other >> Nov 25, 2017 10:02 AM Ivar Drape wrote: Reason for CRM:   The patient is visiting with his daughter, Allegra Grana [phone], and she would like a call back from the provider to discuss her father's health.

## 2017-11-25 NOTE — Telephone Encounter (Signed)
Please call and make a list of her concerns before I call her

## 2017-11-25 NOTE — Telephone Encounter (Signed)
TC to [name], patient's daughter. Her parents are staying with her in [place], [place] until mid January'20.  Her mother has shared concerns with her regarding the patient's health but has not reported these concerns with his PCP. Listed below are concerns of hers: 1. The wife states that [name] gets confused at times ie He asked where is [name] when she doesn't live with them. He asked where are we? When they are home. 2. He tells the wife he was just standing with his father and talking to him. He expired many years ago.  He often says to his wife, Do you hear those people talking in the corner/room? There is no one there.  3. She is asking if a medication he is taking could be causing him to act this way? 4. She wants to know if her father has mentioned these things to Dr. [name] during any of his latest visits.Her concern is that he has not told Dr. [name] these behaviors nor has his wife discussed this with the physician. The patient has a scheduled appointment for April '20. Recommended they could schedule an earlier appointment and have the wife come with him to address these concerns.  She would like a call from Dr. [name] to talk about his behavior and what could be causing it and what do they do next to help him.  She will have her cell with her all day tomorrow (Friday) [phone] Routing to PCP.

## 2017-11-26 NOTE — Telephone Encounter (Signed)
I spoke with [name],  the patient's daughter. Based on the description of the symptoms, he probably has some degree of dementia. He is now visiting her, it is possible that if he is noting he is familiar surroundings he may get slightly confused. She ask if they need to seek medical help. If he is mildly confused then see a MD or take him to a urgent care would be appropriate If he has severe symptoms, any signs of a stroke such as double vision, dizziness, severe headache: Needs to go to the ER. I will be happy to see him as soon as he come back from [place]. As far as his medication, I do not think they are causing his symptoms.

## 2017-11-29 ENCOUNTER — Other Ambulatory Visit: Payer: Self-pay | Admitting: Internal Medicine

## 2018-01-11 ENCOUNTER — Other Ambulatory Visit: Payer: Self-pay

## 2018-01-11 NOTE — Patient Outreach (Signed)
[facility]) Care Management  01/11/2018  GINA COSTILLA 03/08/1926 [id number]   Medication Adherence call to Mr. Littie Deeds patient's telephone number is temporary out patient is due on Simvastatin 10 mg and Losartan 50 mg. Mr. Sanders is showing past due under [place].   [facility] Management Direct Dial [phone]  Fax [phone] [email or web address]

## 2018-03-07 ENCOUNTER — Telehealth: Payer: Self-pay | Admitting: Internal Medicine

## 2018-03-07 NOTE — Telephone Encounter (Signed)
Copied from [place] [id number]. Topic: Quick Communication - See Telephone Encounter >> Mar 07, 2018  4:34 PM Rutherford Nail, NT wrote: CRM for notification. See Telephone encounter for: 03/07/18. Patient calling and states that his pharmacy is out of the famotidine (PEPCID) 40 MG tablet. States that they have the 20MG . Would like to know if Dr [name] would be willing to send the 20MG  prescription in for 2 tablets a day? Requesting 90 day supply. Please advise. [place], [place] - [address]

## 2018-03-08 MED ORDER — FAMOTIDINE 20 MG PO TABS: 20.0000 mg | ORAL_TABLET | Freq: Two times a day (BID) | ORAL | 3 refills | Status: DC

## 2018-03-08 NOTE — Telephone Encounter (Signed)
Okay to change? 

## 2018-03-08 NOTE — Telephone Encounter (Signed)
Please advise 

## 2018-03-08 NOTE — Telephone Encounter (Signed)
Pepcid 20mg  bid sent to [facility].

## 2018-03-13 ENCOUNTER — Other Ambulatory Visit: Payer: Self-pay | Admitting: Internal Medicine

## 2018-03-17 ENCOUNTER — Ambulatory Visit: Payer: Medicare Other | Admitting: Podiatry

## 2018-03-17 DIAGNOSIS — Q828 Other specified congenital malformations of skin: Secondary | ICD-10-CM

## 2018-03-17 DIAGNOSIS — E1142 Type 2 diabetes mellitus with diabetic polyneuropathy: Secondary | ICD-10-CM

## 2018-03-17 DIAGNOSIS — M79676 Pain in unspecified toe(s): Secondary | ICD-10-CM | POA: Diagnosis not present

## 2018-03-17 DIAGNOSIS — B351 Tinea unguium: Secondary | ICD-10-CM

## 2018-03-17 NOTE — Progress Notes (Signed)
He presents today chief complaint of painful elongated toenails 1 through 5 bilateral also resulting in painful reactive hyper keratomas plantar aspect of the bilateral foot.  He states these are painful like to have them trimmed.  Objective: Vital signs are stable he is alert and oriented x3.  Pulses are palpable.  Toenails are long thick yellow dystrophic-like mycotic painful palpation as well as debridement.  He reactive hyper keratoma medial aspect of the first metatarsophalangeal joint is present secondary to major deformity.  Assessment: Pain limb secondary to onychomycosis reactive hyperkeratosis.  Plan: Debridement of toenails 1 through 5 bilateral.  Debridement of all reactive hyperkeratotic tissue.

## 2018-04-18 ENCOUNTER — Ambulatory Visit: Payer: Medicare Other | Admitting: Internal Medicine

## 2018-04-26 ENCOUNTER — Ambulatory Visit (INDEPENDENT_AMBULATORY_CARE_PROVIDER_SITE_OTHER): Payer: Medicare Other | Admitting: Internal Medicine

## 2018-04-26 ENCOUNTER — Other Ambulatory Visit: Payer: Self-pay

## 2018-04-26 DIAGNOSIS — R739 Hyperglycemia, unspecified: Secondary | ICD-10-CM | POA: Diagnosis not present

## 2018-04-26 DIAGNOSIS — E785 Hyperlipidemia, unspecified: Secondary | ICD-10-CM | POA: Diagnosis not present

## 2018-04-26 DIAGNOSIS — I10 Essential (primary) hypertension: Secondary | ICD-10-CM

## 2018-04-26 DIAGNOSIS — R7989 Other specified abnormal findings of blood chemistry: Secondary | ICD-10-CM | POA: Diagnosis not present

## 2018-04-26 NOTE — Progress Notes (Signed)
Subjective:    Patient ID: [name], male    DOB: Jul 24, 1926, 85 y.o.   MRN: [id number]  DOS:  04/26/2018 Type of visit - description: Attempted  to make this a video visit, due to technical difficulties from the patient side it was not possible  thus we proceeded with a Virtual Visit via Telephone    I connected with@ on 04/26/18 at  3:00 PM EDT by telephone and verified that I am speaking with the correct person using two identifiers.  THIS ENCOUNTER IS A VIRTUAL VISIT DUE TO COVID-19 - PATIENT WAS NOT SEEN IN THE OFFICE. PATIENT HAS CONSENTED TO VIRTUAL VISIT / TELEMEDICINE VISIT   Location of patient: home  Location of provider: office  I discussed the limitations, risks, security and privacy concerns of performing an evaluation and management service by telephone and the availability of in person appointments. I also discussed with the patient that there may be a patient responsible charge related to this service. The patient expressed understanding and agreed to proceed.   History of Present Illness: Follow-up Since the last office visit he is doing okay. Good compliance with medication. We reviewed together his blood work and medications.  Good compliance without apparent side effects. COVID-19: Good compliance with social distancing and all the CDC guidelines Mobility: Decrease further compared to few months ago  Review of Systems Denies fever chills No chest pain no difficulty breathing No cough  Past Medical History:  Diagnosis Date  . Allergic rhinitis   . Anemia   . Anxiety   . BPH (benign prostatic hypertrophy)    with weak urinary stream  . Cholecystoduodenal fistula 2016   Dr. Georgette Dover  . Common bile duct stone   . Diabetes mellitus   . Elevated PSA   . Esophageal stricture   . GERD (gastroesophageal reflux disease)    w/ esoph stricture  . Hiatal hernia   . History of colonic polyps   . Hyperlipidemia   . Hypertension   . Inguinal hernia 2016   Dr. Georgette Dover, Pt does not want to proceed with repair at this time  . Internal hemorrhoids   . Osteoarthritis   . Polyneuropathy    NCS by neurology per pt 5/07  . Stroke [facility])    CVA, per CT "old stroke"    Past Surgical History:  Procedure Laterality Date  . CARDIOVASCULAR STRESS TEST  02-2010   done in [place], had CP, +EKG changes, neg nuclear  imagins  . CATARACT EXTRACTION Bilateral 01/2009  . CATARACT EXTRACTION, BILATERAL    . CHOLECYSTECTOMY N/A 08/16/2013   Procedure: LAPAROSCOPIC CONVERTED TO OPEN  CHOLECYSTECTOMY WITH INTRAOPERATIVE CHOLANGIOGRAM;  Surgeon: Imogene Burn. Georgette Dover, MD;  Location: [facility];  Service: General;  Laterality: N/A;  . ENT  surgery for snoring remotely (Dr Lucia Gaskins)    . ERCP W/ SPHICTEROTOMY     ????? Dr Deatra Ina  . HERNIA REPAIR     bil inguinal- per pt L in 90s, R in the 80s  . TOTAL KNEE ARTHROPLASTY Right 05/2006  . TOTAL KNEE ARTHROPLASTY Left 08/2006    Social History   Socioeconomic History  . Marital status: Married    Spouse name: Not on file  . Number of children: 2  . Years of education: Not on file  . Highest education level: Not on file  Occupational History  . Occupation: retired  Scientific laboratory technician  . Financial resource strain: Not on file  . Food insecurity:    Worry:  Not on file    Inability: Not on file  . Transportation needs:    Medical: Not on file    Non-medical: Not on file  Tobacco Use  . Smoking status: Former Research scientist (life sciences)  . Smokeless tobacco: Former Systems developer    Quit date: 01/05/1961  Substance and Sexual Activity  . Alcohol use: No  . Drug use: No  . Sexual activity: Not on file  Lifestyle  . Physical activity:    Days per week: Not on file    Minutes per session: Not on file  . Stress: Not on file  Relationships  . Social connections:    Talks on phone: Not on file    Gets together: Not on file    Attends religious service: Not on file    Active member of club or organization: Not on file    Attends meetings of clubs or  organizations: Not on file    Relationship status: Not on file  . Intimate partner violence:    Fear of current or ex partner: Not on file    Emotionally abused: Not on file    Physically abused: Not on file    Forced sexual activity: Not on file  Other Topics Concern  . Not on file  Social History Narrative   Retired, Married, original from [place], lives w/ wife     has two children, lost a son , has a daughter in [place]           Allergies as of 04/26/2018      Reactions   Amoxicillin Other (See Comments)   "loose motions" "giddiness" Has patient had a PCN reaction causing immediate rash, facial/tongue/throat swelling, SOB or lightheadedness with hypotension: No Has patient had a PCN reaction causing severe rash involving mucus membranes or skin necrosis: No Has patient had a PCN reaction that required hospitalization No Has patient had a PCN reaction occurring within the last 10 years: No If all of the above answers are "NO", then may proceed with Cephalosporin use.      Medication List       Accurate as of April 26, 2018  2:58 PM. Always use your most recent med list.        AMBULATORY NON FORMULARY MEDICATION Medication Name: Abdominal Binder Dx: Ventral Hernia (K43.9)   amLODipine 5 MG tablet Commonly known as:  NORVASC Take 1 tablet (5 mg total) by mouth daily.   aspirin EC 81 MG tablet Take 81 mg by mouth daily.   atenolol 50 MG tablet Commonly known as:  TENORMIN Take 0.5 tablets (25 mg total) by mouth daily.   Centrum Silver tablet Take 1 tablet by mouth daily.   famotidine 20 MG tablet Commonly known as:  Pepcid Take 1 tablet (20 mg total) by mouth 2 (two) times daily.   finasteride 5 MG tablet Commonly known as:  PROSCAR Take 1 tablet (5 mg total) by mouth daily.   FLUoxetine 20 MG capsule Commonly known as:  PROZAC Take 1 capsule (20 mg total) by mouth daily.   losartan 50 MG tablet Commonly known as:  COZAAR Take 1 tablet (50 mg  total) by mouth daily.   pantoprazole 40 MG tablet Commonly known as:  PROTONIX Take 1 tablet (40 mg total) by mouth daily.   simvastatin 10 MG tablet Commonly known as:  ZOCOR Take 1 tablet (10 mg total) by mouth at bedtime.   tamsulosin 0.4 MG Caps capsule Commonly known as:  FLOMAX Take 1  capsule (0.4 mg total) by mouth at bedtime.           Objective:   Physical Exam There were no vitals taken for this visit. This is a phone evaluation, alert oriented x3, in no apparent distress    Assessment    Assessment  Prediabetes Polyneuropathy by NCS, saw neurology 2007  HTN Hyperlipidemia Anxiety DJD: See surgeries GERD with esophageal stricture, HH H/o anemia  BPH, increase PSA, sees urology H/o Stroke ("old stroke" per CT) H/o  hernia, 2016, declined repair Admitted 04/2015: UTI, sepsis, Escherichia coli HOH tried 3 different hearing aids before  PLAN Phone visit Prediabetes: Diet controlled, due for an A1c HTN: Reports normal ambulatory BPs, continue with Tenormin, losartan.  Last BMP satisfactory Hyperlipidemia: On simvastatin, due for AST, ALT, FLP. Mildly elevated TSH: Due for recheck. Mobility: Since the last visit, he is now using a walker as needed, he also stopped driving, fortunately he is getting enough help from people in his community. BPH: Appointment to see urology was canceled due to COVID-19.  Currently symptoms are controlled. Ideally I would draw blood work today but due to COVID-19 will hold off. RTC 3 months, one of my staff members with reach out and schedule    I discussed the assessment and treatment plan with the patient. The patient was provided an opportunity to ask questions and all were answered. The patient agreed with the plan and demonstrated an understanding of the instructions.   The patient was advised to call back or seek an in-person evaluation if the symptoms worsen or if the condition fails to improve as anticipated.  I  provided  15  minutes of non-face-to-face time during this encounter.  Kathlene November, MD

## 2018-04-28 NOTE — Assessment & Plan Note (Signed)
Phone visit Prediabetes: Diet controlled, due for an A1c HTN: Reports normal ambulatory BPs, continue with Tenormin, losartan.  Last BMP satisfactory Hyperlipidemia: On simvastatin, due for AST, ALT, FLP. Mildly elevated TSH: Due for recheck. Mobility: Since the last visit, he is now using a walker as needed, he also stopped driving, fortunately he is getting enough help from people in his community. BPH: Appointment to see urology was canceled due to COVID-19.  Currently symptoms are controlled. Ideally I would draw blood work today but due to COVID-19 will hold off. RTC 3 months, one of my staff members with reach out and schedule

## 2018-06-08 ENCOUNTER — Other Ambulatory Visit: Payer: Self-pay | Admitting: Internal Medicine

## 2018-07-24 LAB — PSA: PSA: 18.3

## 2018-07-27 ENCOUNTER — Ambulatory Visit: Payer: Medicare Other | Admitting: Internal Medicine

## 2018-07-27 ENCOUNTER — Ambulatory Visit (INDEPENDENT_AMBULATORY_CARE_PROVIDER_SITE_OTHER): Payer: Medicare Other | Admitting: Internal Medicine

## 2018-07-27 ENCOUNTER — Encounter: Payer: Self-pay | Admitting: Internal Medicine

## 2018-07-27 ENCOUNTER — Other Ambulatory Visit: Payer: Self-pay

## 2018-07-27 DIAGNOSIS — R739 Hyperglycemia, unspecified: Secondary | ICD-10-CM

## 2018-07-27 DIAGNOSIS — R7989 Other specified abnormal findings of blood chemistry: Secondary | ICD-10-CM

## 2018-07-27 DIAGNOSIS — E785 Hyperlipidemia, unspecified: Secondary | ICD-10-CM

## 2018-07-27 DIAGNOSIS — I10 Essential (primary) hypertension: Secondary | ICD-10-CM

## 2018-07-27 NOTE — Progress Notes (Signed)
   Subjective:    Patient ID: [name], male    DOB: 1926-05-13, 85 y.o.   MRN: [id number]  DOS:  07/27/2018 Type of visit - description: cancel

## 2018-07-27 NOTE — Progress Notes (Signed)
Subjective:    Patient ID: [name], male    DOB: 06/25/1926, 85 y.o.   MRN: [id number]  DOS:  07/27/2018 Type of visit - description: Attempted  to make this a video visit, due to technical difficulties from the patient side it was not possible  thus we proceeded with a Virtual Visit via Telephone    I connected with@   by telephone and verified that I am speaking with the correct person using two identifiers.  THIS ENCOUNTER IS A VIRTUAL VISIT DUE TO COVID-19 - PATIENT WAS NOT SEEN IN THE OFFICE. PATIENT HAS CONSENTED TO VIRTUAL VISIT / TELEMEDICINE VISIT   Location of patient: home  Location of provider: office  I discussed the limitations, risks, security and privacy concerns of performing an evaluation and management service by telephone and the availability of in person appointments. I also discussed with the patient that there may be a patient responsible charge related to this service. The patient expressed understanding and agreed to proceed.   History of Present Illness:   Routine office visit In general feels well. Prediabetes: He does check his CBGs, does not recall readings. HTN: Ambulatory BPs are typically around 137/60  GERD: On dual therapy, symptoms controlled. Hyperlipidemia: Due for blood work.  Review of Systems Denies fever chills No chest pain, no difficulty breathing No nausea, vomiting, diarrhea No dysuria, gross hematuria or difficulty urinating. No constipation.   Past Medical History:  Diagnosis Date  . Allergic rhinitis   . Anemia   . Anxiety   . BPH (benign prostatic hypertrophy)    with weak urinary stream  . Cholecystoduodenal fistula 2016   Dr. Georgette Dover  . Common bile duct stone   . Diabetes mellitus   . Elevated PSA   . Esophageal stricture   . GERD (gastroesophageal reflux disease)    w/ esoph stricture  . Hiatal hernia   . History of colonic polyps   . Hyperlipidemia   . Hypertension   . Inguinal hernia 2016   Dr. Georgette Dover,  Pt does not want to proceed with repair at this time  . Internal hemorrhoids   . Osteoarthritis   . Polyneuropathy    NCS by neurology per pt 5/07  . Stroke [facility])    CVA, per CT "old stroke"    Past Surgical History:  Procedure Laterality Date  . CARDIOVASCULAR STRESS TEST  02-2010   done in [place], had CP, +EKG changes, neg nuclear  imagins  . CATARACT EXTRACTION Bilateral 01/2009  . CATARACT EXTRACTION, BILATERAL    . CHOLECYSTECTOMY N/A 08/16/2013   Procedure: LAPAROSCOPIC CONVERTED TO OPEN  CHOLECYSTECTOMY WITH INTRAOPERATIVE CHOLANGIOGRAM;  Surgeon: Imogene Burn. Georgette Dover, MD;  Location: [place];  Service: General;  Laterality: N/A;  . ENT  surgery for snoring remotely (Dr Lucia Gaskins)    . ERCP W/ SPHICTEROTOMY     ????? Dr Deatra Ina  . HERNIA REPAIR     bil inguinal- per pt L in 90s, R in the 80s  . TOTAL KNEE ARTHROPLASTY Right 05/2006  . TOTAL KNEE ARTHROPLASTY Left 08/2006    Social History   Socioeconomic History  . Marital status: Married    Spouse name: Not on file  . Number of children: 2  . Years of education: Not on file  . Highest education level: Not on file  Occupational History  . Occupation: retired  Scientific laboratory technician  . Financial resource strain: Not on file  . Food insecurity    Worry: Not on  file    Inability: Not on file  . Transportation needs    Medical: Not on file    Non-medical: Not on file  Tobacco Use  . Smoking status: Former Research scientist (life sciences)  . Smokeless tobacco: Former Systems developer    Quit date: 01/05/1961  Substance and Sexual Activity  . Alcohol use: No  . Drug use: No  . Sexual activity: Not on file  Lifestyle  . Physical activity    Days per week: Not on file    Minutes per session: Not on file  . Stress: Not on file  Relationships  . Social Herbalist on phone: Not on file    Gets together: Not on file    Attends religious service: Not on file    Active member of club or organization: Not on file    Attends meetings of clubs or organizations: Not on  file    Relationship status: Not on file  . Intimate partner violence    Fear of current or ex partner: Not on file    Emotionally abused: Not on file    Physically abused: Not on file    Forced sexual activity: Not on file  Other Topics Concern  . Not on file  Social History Narrative   Retired, Married, original from [place], lives w/ wife     has two children, lost a son , has a daughter in [place]           Allergies as of 07/27/2018      Reactions   Amoxicillin Other (See Comments)   "loose motions" "giddiness" Has patient had a PCN reaction causing immediate rash, facial/tongue/throat swelling, SOB or lightheadedness with hypotension: No Has patient had a PCN reaction causing severe rash involving mucus membranes or skin necrosis: No Has patient had a PCN reaction that required hospitalization No Has patient had a PCN reaction occurring within the last 10 years: No If all of the above answers are "NO", then may proceed with Cephalosporin use.      Medication List       Accurate as of July 27, 2018 11:59 PM. If you have any questions, ask your nurse or doctor.        AMBULATORY NON FORMULARY MEDICATION Medication Name: Abdominal Binder Dx: Ventral Hernia (K43.9)   amLODipine 5 MG tablet Commonly known as: NORVASC TAKE 1 TABLET BY MOUTH  DAILY   aspirin EC 81 MG tablet Take 81 mg by mouth daily.   atenolol 50 MG tablet Commonly known as: TENORMIN TAKE ONE-HALF TABLET BY  MOUTH DAILY   Centrum Silver tablet Take 1 tablet by mouth daily.   famotidine 20 MG tablet Commonly known as: Pepcid Take 1 tablet (20 mg total) by mouth 2 (two) times daily.   finasteride 5 MG tablet Commonly known as: PROSCAR Take 1 tablet (5 mg total) by mouth daily.   FLUoxetine 20 MG capsule Commonly known as: PROZAC TAKE 1 CAPSULE BY MOUTH  DAILY   losartan 50 MG tablet Commonly known as: COZAAR TAKE 1 TABLET BY MOUTH  DAILY   pantoprazole 40 MG tablet Commonly known  as: PROTONIX Take 1 tablet (40 mg total) by mouth daily.   simvastatin 10 MG tablet Commonly known as: ZOCOR TAKE 1 TABLET BY MOUTH AT  BEDTIME   tamsulosin 0.4 MG Caps capsule Commonly known as: FLOMAX Take 1 capsule (0.4 mg total) by mouth at bedtime.           Objective:  Physical Exam There were no vitals taken for this visit. This is a phone visit, he is alert oriented x3, speech is clear, he seems to be at baseline.    Assessment      Assessment  Prediabetes Polyneuropathy by NCS, saw neurology 2007  HTN Hyperlipidemia Anxiety DJD: See surgeries GERD with esophageal stricture, HH H/o anemia  BPH, increase PSA, sees urology H/o Stroke ("old stroke" per CT) H/o  hernia, 2016, declined repair Admitted 04/2015: UTI, sepsis, Escherichia coli HOH tried 3 different hearing aids before  PLAN Phone visit Prediabetes: Check A1c EOF:[id number] losartan, Tenormin, check CMP Hyperlipidemia: Simvastatin, check a FLP GERD: Controlled on PPIs Pepcid. Slightly increased TSH: Check TFTs.  No apparent symptoms. Mobility: Unchanged, still not driving, mostly because COVID-19 quarantine. RTC 6 months     I discussed the assessment and treatment plan with the patient. The patient was provided an opportunity to ask questions and all were answered. The patient agreed with the plan and demonstrated an understanding of the instructions.   The patient was advised to call back or seek an in-person evaluation if the symptoms worsen or if the condition fails to improve as anticipated.  I provided 20 minutes of non-face-to-face time during this encounter.  Kathlene November, MD

## 2018-07-28 DIAGNOSIS — R3915 Urgency of urination: Secondary | ICD-10-CM | POA: Diagnosis not present

## 2018-07-28 NOTE — Assessment & Plan Note (Signed)
Phone visit Prediabetes: Check A1c DHW:[id number] losartan, Tenormin, check CMP Hyperlipidemia: Simvastatin, check a FLP GERD: Controlled on PPIs Pepcid. Slightly increased TSH: Check TFTs.  No apparent symptoms. Mobility: Unchanged, still not driving, mostly because COVID-19 quarantine. RTC 6 months

## 2018-08-03 ENCOUNTER — Other Ambulatory Visit (INDEPENDENT_AMBULATORY_CARE_PROVIDER_SITE_OTHER): Payer: Medicare Other

## 2018-08-03 ENCOUNTER — Other Ambulatory Visit: Payer: Self-pay

## 2018-08-03 DIAGNOSIS — R7989 Other specified abnormal findings of blood chemistry: Secondary | ICD-10-CM | POA: Diagnosis not present

## 2018-08-03 DIAGNOSIS — I10 Essential (primary) hypertension: Secondary | ICD-10-CM

## 2018-08-03 DIAGNOSIS — E785 Hyperlipidemia, unspecified: Secondary | ICD-10-CM | POA: Diagnosis not present

## 2018-08-03 DIAGNOSIS — R739 Hyperglycemia, unspecified: Secondary | ICD-10-CM

## 2018-08-03 LAB — CBC WITH DIFFERENTIAL/PLATELET
Basophils Absolute: 0.1 10*3/uL (ref 0.0–0.1)
Basophils Relative: 1.1 % (ref 0.0–3.0)
Eosinophils Absolute: 0.4 10*3/uL (ref 0.0–0.7)
Eosinophils Relative: 6.3 % — ABNORMAL HIGH (ref 0.0–5.0)
HCT: 38.7 % — ABNORMAL LOW (ref 39.0–52.0)
Hemoglobin: 13.6 g/dL (ref 13.0–17.0)
Lymphocytes Relative: 30.5 % (ref 12.0–46.0)
Lymphs Abs: 2.1 10*3/uL (ref 0.7–4.0)
MCHC: 35 g/dL (ref 30.0–36.0)
MCV: 90.5 fl (ref 78.0–100.0)
Monocytes Absolute: 0.8 10*3/uL (ref 0.1–1.0)
Monocytes Relative: 11.1 % (ref 3.0–12.0)
Neutro Abs: 3.5 10*3/uL (ref 1.4–7.7)
Neutrophils Relative %: 51 % (ref 43.0–77.0)
Platelets: 200 10*3/uL (ref 150.0–400.0)
RBC: 4.28 Mil/uL (ref 4.22–5.81)
RDW: 14.4 % (ref 11.5–15.5)
WBC: 6.8 10*3/uL (ref 4.0–10.5)

## 2018-08-03 LAB — T3, FREE: T3, Free: 3 pg/mL (ref 2.3–4.2)

## 2018-08-03 LAB — LIPID PANEL
Cholesterol: 104 mg/dL (ref 0–200)
HDL: 42.9 mg/dL (ref >39.00)
LDL Cholesterol: 37 mg/dL (ref 0–99)
NonHDL: 61.03
Total CHOL/HDL Ratio: 2
Triglycerides: 119 mg/dL (ref 0.0–149.0)
VLDL: 23.8 mg/dL (ref 0.0–40.0)

## 2018-08-03 LAB — COMPREHENSIVE METABOLIC PANEL
ALT: 11 U/L (ref 0–53)
AST: 19 U/L (ref 0–37)
Albumin: 4.5 g/dL (ref 3.5–5.2)
Alkaline Phosphatase: 42 U/L (ref 39–117)
BUN: 14 mg/dL (ref 6–23)
CO2: 25 mEq/L (ref 19–32)
Calcium: 9.6 mg/dL (ref 8.4–10.5)
Chloride: 98 mEq/L (ref 96–112)
Creatinine, Ser: 1.19 mg/dL (ref 0.40–1.50)
GFR: 57.42 mL/min — ABNORMAL LOW (ref >60.00)
Glucose, Bld: 111 mg/dL — ABNORMAL HIGH (ref 70–99)
Potassium: 4.5 mEq/L (ref 3.5–5.1)
Sodium: 132 mEq/L — ABNORMAL LOW (ref 135–145)
Total Bilirubin: 1.7 mg/dL — ABNORMAL HIGH (ref 0.2–1.2)
Total Protein: 7.2 g/dL (ref 6.0–8.3)

## 2018-08-03 LAB — T4, FREE: Free T4: 0.89 ng/dL (ref 0.60–1.60)

## 2018-08-03 LAB — TSH: TSH: 4.51 u[IU]/mL — ABNORMAL HIGH (ref 0.35–4.50)

## 2018-08-03 LAB — HEMOGLOBIN A1C: Hgb A1c MFr Bld: 5.7 % (ref 4.6–6.5)

## 2018-08-04 ENCOUNTER — Encounter: Payer: Self-pay | Admitting: Internal Medicine

## 2018-08-08 ENCOUNTER — Ambulatory Visit: Payer: Medicare Other | Admitting: Internal Medicine

## 2018-09-08 DIAGNOSIS — L821 Other seborrheic keratosis: Secondary | ICD-10-CM | POA: Diagnosis not present

## 2018-09-08 DIAGNOSIS — L309 Dermatitis, unspecified: Secondary | ICD-10-CM | POA: Diagnosis not present

## 2018-09-08 MED FILL — TRIAMCINOLONE 0.1% CREAM: 0.1 | 15 days supply | Qty: 30 | Fill #0

## 2018-09-30 ENCOUNTER — Other Ambulatory Visit: Payer: Self-pay

## 2018-09-30 ENCOUNTER — Ambulatory Visit (INDEPENDENT_AMBULATORY_CARE_PROVIDER_SITE_OTHER): Payer: Medicare Other

## 2018-09-30 DIAGNOSIS — Z23 Encounter for immunization: Secondary | ICD-10-CM

## 2018-09-30 NOTE — Progress Notes (Signed)
Needs flu shot

## 2018-10-28 ENCOUNTER — Other Ambulatory Visit: Payer: Self-pay | Admitting: Internal Medicine

## 2018-11-14 ENCOUNTER — Other Ambulatory Visit: Payer: Self-pay

## 2018-11-15 ENCOUNTER — Ambulatory Visit (INDEPENDENT_AMBULATORY_CARE_PROVIDER_SITE_OTHER): Payer: Medicare Other | Admitting: Internal Medicine

## 2018-11-15 ENCOUNTER — Other Ambulatory Visit: Payer: Self-pay

## 2018-11-15 ENCOUNTER — Encounter: Payer: Self-pay | Admitting: Internal Medicine

## 2018-11-15 VITALS — BP 138/51 | HR 51 | Temp 96.6°F | Resp 16 | Ht 65.0 in | Wt 130.0 lb

## 2018-11-15 DIAGNOSIS — E038 Other specified hypothyroidism: Secondary | ICD-10-CM

## 2018-11-15 DIAGNOSIS — F0390 Unspecified dementia without behavioral disturbance: Secondary | ICD-10-CM | POA: Diagnosis not present

## 2018-11-15 DIAGNOSIS — I10 Essential (primary) hypertension: Secondary | ICD-10-CM | POA: Diagnosis not present

## 2018-11-15 DIAGNOSIS — E785 Hyperlipidemia, unspecified: Secondary | ICD-10-CM | POA: Diagnosis not present

## 2018-11-15 DIAGNOSIS — R739 Hyperglycemia, unspecified: Secondary | ICD-10-CM | POA: Diagnosis not present

## 2018-11-15 DIAGNOSIS — Z0000 Encounter for general adult medical examination without abnormal findings: Secondary | ICD-10-CM | POA: Diagnosis not present

## 2018-11-15 DIAGNOSIS — E039 Hypothyroidism, unspecified: Secondary | ICD-10-CM

## 2018-11-15 NOTE — Patient Instructions (Addendum)
Please schedule Medicare Wellness with Glenard Haring.   GO TO THE LAB : Get the blood work     GO TO THE FRONT DESK Schedule your next appointment   for next week on Tuesday

## 2018-11-15 NOTE — Progress Notes (Signed)
Subjective:    Patient ID: [name], male    DOB: 1926/03/10, 85 y.o.   MRN: [id number]  DOS:  11/15/2018 Type of visit - description: CPX He also has a number of other concerns They wife noticed a gradual decrease on his memory over the last couple of years. He is forgetful, very slow mentally, is not able to go outside of the house by himself because he is confused, unable to do shopping by himself. No behavioral issues His vision is okay, he has severe HOH  Also apparently at some point was diagnosed with sleep apnea, never could tolerate a CPAP, has seen Dr. Lucia Gaskins ENT for that.  He still snoring, slightly more lately?.  Review of Systems No chest pain, difficulty breathing.  No lower extremity edema.  No nausea, vomiting, diarrhea Anxiety well controlled, no depression No headache or dizziness per se.  Other than above, a 14 point review of systems is negative     Past Medical History:  Diagnosis Date  . Allergic rhinitis   . Anemia   . Anxiety   . BPH (benign prostatic hypertrophy)    with weak urinary stream  . Cholecystoduodenal fistula 2016   Dr. Georgette Dover  . Common bile duct stone   . Diabetes mellitus   . Elevated PSA   . Esophageal stricture   . GERD (gastroesophageal reflux disease)    w/ esoph stricture  . Hiatal hernia   . History of colonic polyps   . Hyperlipidemia   . Hypertension   . Inguinal hernia 2016   Dr. Georgette Dover, Pt does not want to proceed with repair at this time  . Internal hemorrhoids   . Osteoarthritis   . Polyneuropathy    NCS by neurology per pt 5/07  . Stroke [facility])    CVA, per CT "old stroke"    Past Surgical History:  Procedure Laterality Date  . CARDIOVASCULAR STRESS TEST  02-2010   done in [place], had CP, +EKG changes, neg nuclear  imagins  . CATARACT EXTRACTION Bilateral 01/2009  . CATARACT EXTRACTION, BILATERAL    . CHOLECYSTECTOMY N/A 08/16/2013   Procedure: LAPAROSCOPIC CONVERTED TO OPEN  CHOLECYSTECTOMY WITH  INTRAOPERATIVE CHOLANGIOGRAM;  Surgeon: Imogene Burn. Georgette Dover, MD;  Location: [place];  Service: General;  Laterality: N/A;  . ENT  surgery for snoring remotely (Dr Lucia Gaskins)    . ERCP W/ SPHICTEROTOMY     ????? Dr Deatra Ina  . HERNIA REPAIR     bil inguinal- per pt L in 90s, R in the 80s  . TOTAL KNEE ARTHROPLASTY Right 05/2006  . TOTAL KNEE ARTHROPLASTY Left 08/2006    Social History   Socioeconomic History  . Marital status: Married    Spouse name: Not on file  . Number of children: 2  . Years of education: Not on file  . Highest education level: Not on file  Occupational History  . Occupation: retired  Scientific laboratory technician  . Financial resource strain: Not on file  . Food insecurity    Worry: Not on file    Inability: Not on file  . Transportation needs    Medical: Not on file    Non-medical: Not on file  Tobacco Use  . Smoking status: Former Research scientist (life sciences)  . Smokeless tobacco: Former Systems developer    Quit date: 01/05/1961  Substance and Sexual Activity  . Alcohol use: No  . Drug use: No  . Sexual activity: Not on file  Lifestyle  . Physical activity  Days per week: Not on file    Minutes per session: Not on file  . Stress: Not on file  Relationships  . Social Herbalist on phone: Not on file    Gets together: Not on file    Attends religious service: Not on file    Active member of club or organization: Not on file    Attends meetings of clubs or organizations: Not on file    Relationship status: Not on file  . Intimate partner violence    Fear of current or ex partner: Not on file    Emotionally abused: Not on file    Physically abused: Not on file    Forced sexual activity: Not on file  Other Topics Concern  . Not on file  Social History Narrative   Retired, Married, original from [place], lives w/ wife     has two children, lost a son , has a daughter in [place]          Family History  Problem Relation Age of Onset  . Diabetes Brother   . Heart attack Brother 34   . Liver cancer Brother   . Colon cancer Neg Hx   . Prostate cancer Neg Hx      Allergies as of 11/15/2018      Reactions   Amoxicillin Other (See Comments)   "loose motions" "giddiness" Has patient had a PCN reaction causing immediate rash, facial/tongue/throat swelling, SOB or lightheadedness with hypotension: No Has patient had a PCN reaction causing severe rash involving mucus membranes or skin necrosis: No Has patient had a PCN reaction that required hospitalization No Has patient had a PCN reaction occurring within the last 10 years: No If all of the above answers are "NO", then may proceed with Cephalosporin use.      Medication List       Accurate as of November 15, 2018 11:59 PM. If you have any questions, ask your nurse or doctor.        AMBULATORY NON FORMULARY MEDICATION Medication Name: Abdominal Binder Dx: Ventral Hernia (K43.9)   amLODipine 5 MG tablet Commonly known as: NORVASC Take 1 tablet (5 mg total) by mouth daily.   aspirin EC 81 MG tablet Take 81 mg by mouth daily.   atenolol 50 MG tablet Commonly known as: TENORMIN Take 0.5 tablets (25 mg total) by mouth daily.   Centrum Silver tablet Take 1 tablet by mouth daily.   famotidine 20 MG tablet Commonly known as: Pepcid Take 1 tablet (20 mg total) by mouth 2 (two) times daily.   finasteride 5 MG tablet Commonly known as: PROSCAR Take 1 tablet (5 mg total) by mouth daily.   FLUoxetine 20 MG capsule Commonly known as: PROZAC Take 1 capsule (20 mg total) by mouth daily.   losartan 50 MG tablet Commonly known as: COZAAR Take 1 tablet (50 mg total) by mouth daily.   pantoprazole 40 MG tablet Commonly known as: PROTONIX Take 1 tablet (40 mg total) by mouth daily.   simvastatin 10 MG tablet Commonly known as: ZOCOR Take 1 tablet (10 mg total) by mouth at bedtime.   tamsulosin 0.4 MG Caps capsule Commonly known as: FLOMAX Take 1 capsule (0.4 mg total) by mouth at bedtime.            Objective:   Physical Exam BP (!) 138/51 (BP Location: Right Arm, Patient Position: Sitting, Cuff Size: Small)   Pulse (!) 51   Temp Marland Kitchen)  96.6 F (35.9 C) (Temporal)   Resp 16   Ht 5\' 5"  (1.651 m)   Wt 130 lb (59 kg)   SpO2 100%   BMI 21.63 kg/m    General: Well developed, NAD, BMI noted Neck: No  thyromegaly  HEENT:  Normocephalic . Face symmetric, atraumatic Lungs:  CTA B Normal respiratory effort, no intercostal retractions, no accessory muscle use. Heart: RRR,  no murmur.  No pretibial edema bilaterally  Abdomen:  Not distended, soft, non-tender. No rebound or rigidity.   Skin: Exposed areas without rash. Not pale. Not jaundice Neurologic:  alert & oriented to self, time and space.  He does seem very slow mentally. Speech normal, gait appropriate for age and unassisted.  Transfer  is difficult and slow. Strength symmetric and appropriate for age.  Psych: Behavior appropriate. No anxious or depressed appearing.     Assessment    Assessment  Prediabetes Polyneuropathy by NCS, saw neurology 2007  HTN Hyperlipidemia Anxiety DJD: See surgeries GERD with esophageal stricture, HH H/o anemia  BPH, increase PSA, sees urology H/o Stroke ("old stroke" per CT) H/o  hernia, 2016, declined repair Admitted 04/2015: UTI, sepsis, Escherichia coli HOH tried 3 different hearing aids before OSA ?  PLAN Here for CPX HTN: Seems well controlled, ambulatory BPs in the 120/60.  On amlodipine, Tenormin, losartan.  Check a BMP High cholesterol: On simvastatin, well controlled. Anxiety: On Prozac, well control Dementia: The wife is concerned about his memory, see HPI, despite his being alert oriented x3 he has a very hard time functioning, likely  has dementia. Recommend to come next week for detailed mental exam and talk more about dementia. Factors that can be making this problem worse: HOH despite using hearing aids. We will check additional labs including vitamin B, folic  acid, vitamin D, RPR. Consider imaging. OSA?  The patient was told many years ago that he had OSA, he tells me today he is definitely not tolerant to CPAP.  Reports snoring. Subclinical hypothyroidism: Check TFTs RTC 1 week  Today, in addition to his CPX I spent more than 20 min with the patient: >50% of the time counseling regards chronic medical problems including HTN, high cholesterol, anxiety.  Also a new problem: Dementia.

## 2018-11-15 NOTE — Progress Notes (Signed)
Pre visit review using our clinic review tool, if applicable. No additional management support is needed unless otherwise documented below in the visit note. 

## 2018-11-16 NOTE — Assessment & Plan Note (Signed)
-  Td 3-12  - Pneumovax 2006 and 10/2017 - prevnar 2015 - shingrex d/w pt again today, declined for now    - had a flu shot  -CCS:no longer indicated  bone Density: 10/21/2005 and  11-09, both normal- neg   Prostate cancer screening: per urology, to be seen tomorrow Counseling: diet discussed , encouraged to saty active

## 2018-11-16 NOTE — Assessment & Plan Note (Addendum)
Here for CPX HTN: Seems well controlled, ambulatory BPs in the 120/60.  On amlodipine, Tenormin, losartan.  Check a BMP High cholesterol: On simvastatin, well controlled. Anxiety: On Prozac, well control Dementia: The wife is concerned about his memory, see HPI, despite his being alert oriented x3 he has a very hard time functioning, likely  has dementia. Recommend to come next week for detailed mental exam and talk more about dementia. Factors that can be making this problem worse: HOH despite using hearing aids. We will check additional labs including vitamin B, folic acid, vitamin D, RPR. Consider imaging. OSA?  The patient was told many years ago that he had OSA, he tells me today he is definitely not tolerant to CPAP.  Reports snoring. Subclinical hypothyroidism: Check TFTs RTC 1 week

## 2018-11-21 ENCOUNTER — Telehealth: Payer: Self-pay

## 2018-11-21 DIAGNOSIS — Z20828 Contact with and (suspected) exposure to other viral communicable diseases: Secondary | ICD-10-CM

## 2018-11-21 DIAGNOSIS — Z20822 Contact with and (suspected) exposure to covid-19: Secondary | ICD-10-CM

## 2018-11-21 NOTE — Telephone Encounter (Signed)
Please advise 

## 2018-11-21 NOTE — Telephone Encounter (Signed)
Spoke w/ Pt- he has decided not to have testing at this time.

## 2018-11-21 NOTE — Telephone Encounter (Signed)
Copied from [place] [id number]. Topic: General - Inquiry >> Nov 21, 2018 11:43 AM Reyne Dumas L wrote: Reason for CRM:   Pt states that he will be going to [place] next week and would like doctors recommendations on COVID testing.

## 2018-11-21 NOTE — Telephone Encounter (Signed)
If he likes to be tested before he leaves that is okay, put a order.

## 2018-11-22 ENCOUNTER — Ambulatory Visit: Payer: Medicare Other | Admitting: Internal Medicine

## 2018-11-23 ENCOUNTER — Other Ambulatory Visit: Payer: Self-pay

## 2018-11-23 ENCOUNTER — Encounter: Payer: Self-pay | Admitting: Family

## 2018-11-23 ENCOUNTER — Ambulatory Visit (INDEPENDENT_AMBULATORY_CARE_PROVIDER_SITE_OTHER): Payer: Medicare Other | Admitting: Family

## 2018-11-23 VITALS — BP 149/68 | HR 65 | Temp 96.5°F | Resp 16 | Ht 65.0 in | Wt 131.0 lb

## 2018-11-23 DIAGNOSIS — Z20828 Contact with and (suspected) exposure to other viral communicable diseases: Secondary | ICD-10-CM | POA: Diagnosis not present

## 2018-11-23 DIAGNOSIS — Z20822 Contact with and (suspected) exposure to covid-19: Secondary | ICD-10-CM

## 2018-11-23 NOTE — Progress Notes (Deleted)
co

## 2018-11-23 NOTE — Patient Instructions (Signed)
Have a safe trip. We will notify you with your results.

## 2018-11-23 NOTE — Progress Notes (Signed)
Subjective:    Patient ID: [name], male    DOB: May 11, 1926, 85 y.o.   MRN: [id number]  HPI  Patient is a 85 yr old male who presents today requesting to be tested for covid-19 due to plans to travel out of the country on 11/22.  He is needing documentation of negative COVID-19 test for travel purposes. He has no symptoms of COVID-19.  It is a hardship for he and his elderly wife to travel to our [facility] for COVID-19 testing and he is requesting that we test him at our office.    Review of Systems  Constitutional: Negative for fever.  HENT: Negative for rhinorrhea and sore throat.   Respiratory: Negative for cough.   Neurological: Negative for headaches.    Past Medical History:  Diagnosis Date  . Allergic rhinitis   . Anemia   . Anxiety   . BPH (benign prostatic hypertrophy)    with weak urinary stream  . Cholecystoduodenal fistula 2016   Dr. Georgette Dover  . Common bile duct stone   . Diabetes mellitus   . Elevated PSA   . Esophageal stricture   . GERD (gastroesophageal reflux disease)    w/ esoph stricture  . Hiatal hernia   . History of colonic polyps   . Hyperlipidemia   . Hypertension   . Inguinal hernia 2016   Dr. Georgette Dover, Pt does not want to proceed with repair at this time  . Internal hemorrhoids   . Osteoarthritis   . Polyneuropathy    NCS by neurology per pt 5/07  . Stroke [facility])    CVA, per CT "old stroke"     Social History   Socioeconomic History  . Marital status: Married    Spouse name: Not on file  . Number of children: 2  . Years of education: Not on file  . Highest education level: Not on file  Occupational History  . Occupation: retired  Scientific laboratory technician  . Financial resource strain: Not on file  . Food insecurity    Worry: Not on file    Inability: Not on file  . Transportation needs    Medical: Not on file    Non-medical: Not on file  Tobacco Use  . Smoking status: Former Research scientist (life sciences)  . Smokeless tobacco: Former Systems developer    Quit  date: 01/05/1961  Substance and Sexual Activity  . Alcohol use: No  . Drug use: No  . Sexual activity: Not on file  Lifestyle  . Physical activity    Days per week: Not on file    Minutes per session: Not on file  . Stress: Not on file  Relationships  . Social Herbalist on phone: Not on file    Gets together: Not on file    Attends religious service: Not on file    Active member of club or organization: Not on file    Attends meetings of clubs or organizations: Not on file    Relationship status: Not on file  . Intimate partner violence    Fear of current or ex partner: Not on file    Emotionally abused: Not on file    Physically abused: Not on file    Forced sexual activity: Not on file  Other Topics Concern  . Not on file  Social History Narrative   Retired, Married, original from [place], lives w/ wife     has two children, lost a son , has a daughter  in [place]         Past Surgical History:  Procedure Laterality Date  . CARDIOVASCULAR STRESS TEST  02-2010   done in [place], had CP, +EKG changes, neg nuclear  imagins  . CATARACT EXTRACTION Bilateral 01/2009  . CATARACT EXTRACTION, BILATERAL    . CHOLECYSTECTOMY N/A 08/16/2013   Procedure: LAPAROSCOPIC CONVERTED TO OPEN  CHOLECYSTECTOMY WITH INTRAOPERATIVE CHOLANGIOGRAM;  Surgeon: Imogene Burn. Georgette Dover, MD;  Location: [place];  Service: General;  Laterality: N/A;  . ENT  surgery for snoring remotely (Dr Lucia Gaskins)    . ERCP W/ SPHICTEROTOMY     ????? Dr Deatra Ina  . HERNIA REPAIR     bil inguinal- per pt L in 90s, R in the 80s  . TOTAL KNEE ARTHROPLASTY Right 05/2006  . TOTAL KNEE ARTHROPLASTY Left 08/2006    Family History  Problem Relation Age of Onset  . Diabetes Brother   . Heart attack Brother 21  . Liver cancer Brother   . Colon cancer Neg Hx   . Prostate cancer Neg Hx     Allergies  Allergen Reactions  . Amoxicillin Other (See Comments)    "loose motions" "giddiness" Has patient had a PCN reaction  causing immediate rash, facial/tongue/throat swelling, SOB or lightheadedness with hypotension: No Has patient had a PCN reaction causing severe rash involving mucus membranes or skin necrosis: No Has patient had a PCN reaction that required hospitalization No Has patient had a PCN reaction occurring within the last 10 years: No If all of the above answers are "NO", then may proceed with Cephalosporin use.     Current Outpatient Medications on File Prior to Visit  Medication Sig Dispense Refill  . AMBULATORY NON FORMULARY MEDICATION Medication Name: Abdominal Binder Dx: Ventral Hernia (K43.9) 1 each 0  . amLODipine (NORVASC) 5 MG tablet Take 1 tablet (5 mg total) by mouth daily. 90 tablet 1  . aspirin EC 81 MG tablet Take 81 mg by mouth daily.    Marland Kitchen atenolol (TENORMIN) 50 MG tablet Take 0.5 tablets (25 mg total) by mouth daily. 45 tablet 1  . famotidine (PEPCID) 20 MG tablet Take 1 tablet (20 mg total) by mouth 2 (two) times daily. 180 tablet 3  . finasteride (PROSCAR) 5 MG tablet Take 1 tablet (5 mg total) by mouth daily. 90 tablet 1  . FLUoxetine (PROZAC) 20 MG capsule Take 1 capsule (20 mg total) by mouth daily. 90 capsule 1  . losartan (COZAAR) 50 MG tablet Take 1 tablet (50 mg total) by mouth daily. 90 tablet 1  . Multiple Vitamins-Minerals (CENTRUM SILVER) tablet Take 1 tablet by mouth daily.    . pantoprazole (PROTONIX) 40 MG tablet Take 1 tablet (40 mg total) by mouth daily. 90 tablet 3  . simvastatin (ZOCOR) 10 MG tablet Take 1 tablet (10 mg total) by mouth at bedtime. 90 tablet 1   No current facility-administered medications on file prior to visit.     BP (!) 149/68 (BP Location: Right Arm, Patient Position: Sitting, Cuff Size: Small)   Pulse 65   Temp (!) 96.5 F (35.8 C) (Temporal)   Resp 16   Ht 5\' 5"  (1.651 m)   Wt 131 lb (59.4 kg)   SpO2 100%   BMI 21.80 kg/m       Objective:   Physical Exam Constitutional:      General: He is not in acute distress.     Appearance: He is well-developed.  HENT:     Head:  Normocephalic and atraumatic.  Cardiovascular:     Rate and Rhythm: Normal rate and regular rhythm.     Heart sounds: No murmur.  Pulmonary:     Effort: Pulmonary effort is normal. No respiratory distress.     Breath sounds: Normal breath sounds. No wheezing or rales.  Skin:    General: Skin is warm and dry.  Neurological:     Mental Status: He is alert and oriented to person, place, and time.  Psychiatric:        Behavior: Behavior normal.        Thought Content: Thought content normal.           Assessment & Plan:  Need for COVID-19 testing- swab performed in office today. Advised pt that we will contact him with the results of his test.

## 2018-11-24 ENCOUNTER — Telehealth: Payer: Self-pay | Admitting: Internal Medicine

## 2018-11-24 NOTE — Telephone Encounter (Signed)
COVID test was ordered by [facility]. Unfortunately we can't determine when results will be back.

## 2018-11-24 NOTE — Telephone Encounter (Signed)
Pt's wife called wanting to know if possible to send results of covid test that was done on Wednesday to his email: [email or web address], pt's wife states is needing results by Friday since they are going to travel to [place] possibly on Saturday 11-26-2018. Please advise.

## 2018-11-25 LAB — NOVEL CORONAVIRUS, NAA: SARS-CoV-2, NAA: NOT DETECTED

## 2018-11-25 NOTE — Telephone Encounter (Signed)
Please contact pt and let him know that his covid test is negative and can be accessed by mychart or picked up at the front desk this afternoon.

## 2018-11-28 NOTE — Telephone Encounter (Signed)
Results given to patient and his wife last week

## 2019-01-04 ENCOUNTER — Telehealth: Payer: Self-pay | Admitting: *Deleted

## 2019-01-04 NOTE — Telephone Encounter (Signed)
Copied from [place]. Topic: General - Other >> Jan 04, 2019  9:19 AM Alanda Slim E wrote: Reason for CRM: Pt called and asked for the nurse to please call him when the covid vaccine is available so that he can get it and his wife/ please advise

## 2019-01-12 NOTE — Telephone Encounter (Signed)
Information given so patients can call to make an appointment

## 2019-02-02 ENCOUNTER — Telehealth: Payer: Self-pay

## 2019-02-03 ENCOUNTER — Ambulatory Visit (INDEPENDENT_AMBULATORY_CARE_PROVIDER_SITE_OTHER): Payer: Medicare Other | Admitting: Internal Medicine

## 2019-02-03 DIAGNOSIS — M8949 Other hypertrophic osteoarthropathy, multiple sites: Secondary | ICD-10-CM | POA: Diagnosis not present

## 2019-02-03 DIAGNOSIS — F0390 Unspecified dementia without behavioral disturbance: Secondary | ICD-10-CM | POA: Diagnosis not present

## 2019-02-03 DIAGNOSIS — M159 Polyosteoarthritis, unspecified: Secondary | ICD-10-CM

## 2019-02-03 NOTE — Progress Notes (Signed)
Subjective:    Patient ID: [name], male    DOB: 12/10/1926, 86 y.o.   MRN: [id number]  DOS:  02/03/2019 Type of visit - description: Virtual Visit via Video Note  I connected with the above patient  by a video enabled telemedicine application and verified that I am speaking with the correct person using two identifiers.   THIS ENCOUNTER IS A VIRTUAL VISIT DUE TO COVID-19 - PATIENT WAS NOT SEEN IN THE OFFICE. PATIENT HAS CONSENTED TO VIRTUAL VISIT / TELEMEDICINE VISIT   Location of patient: home  Location of provider: office  I discussed the limitations of evaluation and management by telemedicine and the availability of in person appointments. The patient expressed understanding and agreed to proceed.  Acute I communicated with the patient and his wife. The main concern is that for the last 2 days he had upper and lower back pain. Also bilateral shoulder pain. He has taking "few Advil and Tylenol" and today when he woke up he was feeling better. Pain has essentially subsided    Review of Systems no fever chills. No headache no chest pain or difficulty breathing. His appetite was decreased but he is better now. No injuries or fall no lower extremity or upper extremity paresthesias his mobility is at baseline.  Past Medical History:  Diagnosis Date  . Allergic rhinitis   . Anemia   . Anxiety   . BPH (benign prostatic hypertrophy)    with weak urinary stream  . Cholecystoduodenal fistula 2016   Dr. Georgette Dover  . Common bile duct stone   . Diabetes mellitus   . Elevated PSA   . Esophageal stricture   . GERD (gastroesophageal reflux disease)    w/ esoph stricture  . Hiatal hernia   . History of colonic polyps   . Hyperlipidemia   . Hypertension   . Inguinal hernia 2016   Dr. Georgette Dover, Pt does not want to proceed with repair at this time  . Internal hemorrhoids   . Osteoarthritis   . Polyneuropathy    NCS by neurology per pt 5/07  . Stroke [facility])    CVA, per CT  "old stroke"    Past Surgical History:  Procedure Laterality Date  . CARDIOVASCULAR STRESS TEST  02-2010   done in [place], had CP, +EKG changes, neg nuclear  imagins  . CATARACT EXTRACTION Bilateral 01/2009  . CATARACT EXTRACTION, BILATERAL    . CHOLECYSTECTOMY N/A 08/16/2013   Procedure: LAPAROSCOPIC CONVERTED TO OPEN  CHOLECYSTECTOMY WITH INTRAOPERATIVE CHOLANGIOGRAM;  Surgeon: Imogene Burn. Georgette Dover, MD;  Location: [place];  Service: General;  Laterality: N/A;  . ENT  surgery for snoring remotely (Dr Lucia Gaskins)    . ERCP W/ SPHICTEROTOMY     ????? Dr Deatra Ina  . HERNIA REPAIR     bil inguinal- per pt L in 90s, R in the 80s  . TOTAL KNEE ARTHROPLASTY Right 05/2006  . TOTAL KNEE ARTHROPLASTY Left 08/2006        Objective:   Physical Exam There were no vitals taken for this visit. This is a virtual video visit, he is alert oriented x3, he is a slightly hard of hearing but using hearing aids. Communication was reasonably good. He does not seem to be in distress or in pain. No vital signs done today.    Assessment     Assessment  Prediabetes Polyneuropathy by NCS, saw neurology 2007  HTN Hyperlipidemia Anxiety DJD: See surgeries GERD with esophageal stricture, HH H/o anemia  BPH,  increase PSA, sees urology H/o Stroke ("old stroke" per CT) H/o  hernia, 2016, declined repair Admitted 04/2015: UTI, sepsis, Escherichia coli HOH tried 3 different hearing aids before OSA ?  PLAN Back pain, shoulder pain: As described above, symptoms self resolved, no recent injury. No paresthesias or difficulty with his gait. The fact that the pain resolve is reassuring, related to DJD?Marland Kitchen Plan: Observation for now, avoid Advil, if needed take Tylenol. Planning to see him next week but he will call anytime his symptoms resurface more severe. See next Dementia: Wife reports that dementia is about the same as before. Labs ordered at the last visit were not done. Will call and schedule an office visit for next  week.     I discussed the assessment and treatment plan with the patient. The patient was provided an opportunity to ask questions and all were answered. The patient agreed with the plan and demonstrated an understanding of the instructions.   The patient was advised to call back or seek an in-person evaluation if the symptoms worsen or if the condition fails to improve as anticipated.

## 2019-02-05 NOTE — Assessment & Plan Note (Signed)
      I discussed the assessment and treatment plan with the patient. The patient was provided an opportunity to ask questions and all were answered. The patient agreed with the plan and demonstrated an understanding of the instructions.   The patient was advised to call back or seek an in-person evaluation if the symptoms worsen or if the condition fails to improve as anticipated.   NO NEED FOR TIME ID DOCUMENTATION SUPPORTS A LEVEL III OR IV OK TO USE TEMPLATE AS BELOW IF NEEDED   Today, I spent more than    min with the patient: >50% of the time counseling regards  Also: -reviewing the chart and labs ordered by other providers  -coordinating his care

## 2019-02-10 ENCOUNTER — Ambulatory Visit (HOSPITAL_BASED_OUTPATIENT_CLINIC_OR_DEPARTMENT_OTHER)
Admission: RE | Admit: 2019-02-10 | Discharge: 2019-02-10 | Disposition: A | Discharge status: Home / Self Care | Payer: Medicare Other | Source: Ambulatory Visit | Attending: Internal Medicine | Admitting: Internal Medicine

## 2019-02-10 ENCOUNTER — Encounter: Payer: Self-pay | Admitting: Internal Medicine

## 2019-02-10 ENCOUNTER — Ambulatory Visit (INDEPENDENT_AMBULATORY_CARE_PROVIDER_SITE_OTHER): Payer: Medicare Other | Admitting: Internal Medicine

## 2019-02-10 ENCOUNTER — Other Ambulatory Visit: Payer: Self-pay

## 2019-02-10 VITALS — BP 142/48 | HR 59 | Temp 95.7°F | Resp 18 | Ht 65.0 in | Wt 132.2 lb

## 2019-02-10 DIAGNOSIS — I10 Essential (primary) hypertension: Secondary | ICD-10-CM | POA: Diagnosis not present

## 2019-02-10 DIAGNOSIS — M546 Pain in thoracic spine: Secondary | ICD-10-CM | POA: Insufficient documentation

## 2019-02-10 DIAGNOSIS — E038 Other specified hypothyroidism: Secondary | ICD-10-CM

## 2019-02-10 DIAGNOSIS — F0390 Unspecified dementia without behavioral disturbance: Secondary | ICD-10-CM | POA: Diagnosis not present

## 2019-02-10 DIAGNOSIS — M4804 Spinal stenosis, thoracic region: Secondary | ICD-10-CM | POA: Diagnosis not present

## 2019-02-10 DIAGNOSIS — E039 Hypothyroidism, unspecified: Secondary | ICD-10-CM | POA: Diagnosis not present

## 2019-02-10 MED ORDER — DONEPEZIL HCL 5 MG PO TABS: 5.0000 mg | ORAL_TABLET | Freq: Every day | ORAL | 1 refills | Status: DC

## 2019-02-10 NOTE — Patient Instructions (Addendum)
   GO TO THE FRONT DESK Come back for blood work next week, please make an appointment  Come back for a checkup in 3 months, please make an appointment   Start Aricept 5 mg daily    [place]   The 36-Hour Day a book by Eda Keys and Collier Salina Rabins is a detailed self-help guide for people caring for loved ones with Alzheimer's disease, dementia, and other memory impairments.  Web Resources : [employer]  http://rojas.com/

## 2019-02-10 NOTE — Progress Notes (Signed)
Subjective:    Patient ID: [name], male    DOB: Jun 06, 1926, 86 y.o.   MRN: [id number]  DOS:  02/10/2019 Type of visit - description: Follow-up, here with his wife  Main concern is dementia, this started a while back and is gradual. Wife reports that he is constantly asking the same questions, he has been less careful, not taking a shower frequently. He also sleep well at night but take frequent naps during the daytime. Denies wandering, no behavioral issues. He still pay the bills. Still the same very limited driving (his family is against that and I agreed).  Also, long history of pain between the shoulder blades, no headache, fever or chills.  This is going on for months to years. Increased with moving or walking.   Wt Readings from Last 3 Encounters:  02/10/19 132 lb 4 oz (60 kg)  11/23/18 131 lb (59.4 kg)  11/15/18 130 lb (59 kg)     Review of Systems Currently he denies feeling sad or depressed No nausea, vomiting, diarrhea  Past Medical History:  Diagnosis Date  . Allergic rhinitis   . Anemia   . Anxiety   . BPH (benign prostatic hypertrophy)    with weak urinary stream  . Cholecystoduodenal fistula 2016   Dr. Georgette Dover  . Common bile duct stone   . Diabetes mellitus   . Elevated PSA   . Esophageal stricture   . GERD (gastroesophageal reflux disease)    w/ esoph stricture  . Hiatal hernia   . History of colonic polyps   . Hyperlipidemia   . Hypertension   . Inguinal hernia 2016   Dr. Georgette Dover, Pt does not want to proceed with repair at this time  . Internal hemorrhoids   . Osteoarthritis   . Polyneuropathy    NCS by neurology per pt 5/07  . Stroke [facility])    CVA, per CT "old stroke"    Past Surgical History:  Procedure Laterality Date  . CARDIOVASCULAR STRESS TEST  02-2010   done in [place], had CP, +EKG changes, neg nuclear  imagins  . CATARACT EXTRACTION Bilateral 01/2009  . CATARACT EXTRACTION, BILATERAL    . CHOLECYSTECTOMY N/A 08/16/2013   Procedure: LAPAROSCOPIC CONVERTED TO OPEN  CHOLECYSTECTOMY WITH INTRAOPERATIVE CHOLANGIOGRAM;  Surgeon: Imogene Burn. Georgette Dover, MD;  Location: [place];  Service: General;  Laterality: N/A;  . ENT  surgery for snoring remotely (Dr Lucia Gaskins)    . ERCP W/ SPHICTEROTOMY     ????? Dr Deatra Ina  . HERNIA REPAIR     bil inguinal- per pt L in 90s, R in the 80s  . TOTAL KNEE ARTHROPLASTY Right 05/2006  . TOTAL KNEE ARTHROPLASTY Left 08/2006        Objective:   Physical Exam BP (!) 142/48 (BP Location: Left Arm, Patient Position: Sitting, Cuff Size: Small)   Pulse (!) 59   Temp (!) 95.7 F (35.4 C) (Temporal)   Resp 18   Ht 5\' 5"  (1.651 m)   Wt 132 lb 4 oz (60 kg)   SpO2 100%   BMI 22.01 kg/m  General:   Well developed, NAD, BMI noted. HEENT:  Normocephalic . Face symmetric, atraumatic  Neurologic:  alert & MMSE: 23 Speech normal, gait appropriate for age and unassisted Psych--  Behavior appropriate. No anxious or depressed appearing.      Assessment     Assessment  Prediabetes Polyneuropathy by NCS, saw neurology 2007  HTN Hyperlipidemia Anxiety DJD: See surgeries GERD with esophageal  stricture, HH H/o anemia  BPH, increase PSA, sees urology H/o Stroke ("old stroke" per CT) H/o  hernia, 2016, declined repair Admitted 04/2015: UTI, sepsis, Escherichia coli HOH tried 3 different hearing aids before OSA ?  PLAN Dementia: Today we talk about dementia, he does have some symptoms, MMSE 23 which is consistent with mild dementia. We had a long conversation about dementia, what that is, what to expect, how to treat it. We will start Aricept 5 mg daily and reassess in 3 months. Do not see immediate need to do any brain imaging Labs: 123456, folic acid.  TFTs, RPR, vitamin D HTN: Seems controlled, continueLosartan, Tenormin, check a BMP Thoracic pain: At the end of the visit today reported chronic thoracic pain, "pain between the shoulder blades" triggered by walking, no headache, fever or  chills.  No weight loss. We will do x-ray and reassess on RTC RTC next week for labs and 3 months for a checkup  This visit occurred during the SARS-CoV-2 public health emergency.  Safety protocols were in place, including screening questions prior to the visit, additional usage of staff PPE, and extensive cleaning of exam room while observing appropriate contact time as indicated for disinfecting solutions.

## 2019-02-10 NOTE — Progress Notes (Signed)
Pre visit review using our clinic review tool, if applicable. No additional management support is needed unless otherwise documented below in the visit note. 

## 2019-02-10 NOTE — Addendum Note (Signed)
Addended byDamita Dunnings D on: 02/10/2019 04:01 PM   Modules accepted: Orders

## 2019-02-11 NOTE — Assessment & Plan Note (Signed)
Dementia: Today we talk about dementia, he does have some symptoms, MMSE 23 which is consistent with mild dementia. We had a long conversation about dementia, what that is, what to expect, how to treat it. We will start Aricept 5 mg daily and reassess in 3 months. Do not see immediate need to do any brain imaging Labs: 123456, folic acid.  TFTs, RPR, vitamin D HTN: Seems controlled, continueLosartan, Tenormin, check a BMP Thoracic pain: At the end of the visit today reported chronic thoracic pain, "pain between the shoulder blades" triggered by walking, no headache, fever or chills.  No weight loss. We will do x-ray and reassess on RTC RTC next week for labs and 3 months for a checkup

## 2019-02-13 ENCOUNTER — Other Ambulatory Visit: Payer: Self-pay

## 2019-02-13 ENCOUNTER — Other Ambulatory Visit (INDEPENDENT_AMBULATORY_CARE_PROVIDER_SITE_OTHER): Payer: Medicare Other

## 2019-02-13 DIAGNOSIS — E039 Hypothyroidism, unspecified: Secondary | ICD-10-CM

## 2019-02-13 DIAGNOSIS — I10 Essential (primary) hypertension: Secondary | ICD-10-CM

## 2019-02-13 DIAGNOSIS — E038 Other specified hypothyroidism: Secondary | ICD-10-CM

## 2019-02-13 DIAGNOSIS — F0390 Unspecified dementia without behavioral disturbance: Secondary | ICD-10-CM | POA: Diagnosis not present

## 2019-02-14 LAB — BASIC METABOLIC PANEL
BUN: 12 mg/dL (ref 6–23)
CO2: 26 mEq/L (ref 19–32)
Calcium: 8.7 mg/dL (ref 8.4–10.5)
Chloride: 98 mEq/L (ref 96–112)
Creatinine, Ser: 1.07 mg/dL (ref 0.40–1.50)
GFR: 64.84 mL/min (ref >60.00)
Glucose, Bld: 152 mg/dL — ABNORMAL HIGH (ref 70–99)
Potassium: 3.8 mEq/L (ref 3.5–5.1)
Sodium: 132 mEq/L — ABNORMAL LOW (ref 135–145)

## 2019-02-14 LAB — RPR: RPR Ser Ql: NONREACTIVE

## 2019-02-14 LAB — VITAMIN D 25 HYDROXY (VIT D DEFICIENCY, FRACTURES): VITD: 20.04 ng/mL — ABNORMAL LOW (ref 30.00–100.00)

## 2019-02-14 LAB — B12 AND FOLATE PANEL
Folate: >24.1 ng/mL (ref >5.9)
Vitamin B-12: 439 pg/mL (ref 211–911)

## 2019-02-14 LAB — T4, FREE: Free T4: 0.93 ng/dL (ref 0.60–1.60)

## 2019-02-14 LAB — T3, FREE: T3, Free: 2.9 pg/mL (ref 2.3–4.2)

## 2019-02-14 LAB — TSH: TSH: 2.44 u[IU]/mL (ref 0.35–4.50)

## 2019-02-15 LAB — PSA: PSA: 29.9

## 2019-02-16 MED ORDER — VITAMIN D (ERGOCALCIFEROL) 1.25 MG (50000 UNIT) PO CAPS: 50000.0000 [IU] | ORAL_CAPSULE | ORAL | 0 refills | Status: DC

## 2019-02-16 NOTE — Addendum Note (Signed)
Addended byDamita Dunnings D on: 02/16/2019 01:51 PM   Modules accepted: Orders

## 2019-02-22 DIAGNOSIS — R3912 Poor urinary stream: Secondary | ICD-10-CM | POA: Diagnosis not present

## 2019-03-08 DIAGNOSIS — K4090 Unilateral inguinal hernia, without obstruction or gangrene, not specified as recurrent: Secondary | ICD-10-CM | POA: Diagnosis not present

## 2019-03-08 DIAGNOSIS — R3912 Poor urinary stream: Secondary | ICD-10-CM | POA: Diagnosis not present

## 2019-03-13 ENCOUNTER — Encounter: Payer: Self-pay | Admitting: Internal Medicine

## 2019-03-16 ENCOUNTER — Other Ambulatory Visit: Payer: Self-pay | Admitting: Internal Medicine

## 2019-03-31 MED FILL — ALFUZOSIN HCL ER 10 MG TAB: 10 | 30 days supply | Qty: 30 | Fill #0

## 2019-04-03 DIAGNOSIS — H26493 Other secondary cataract, bilateral: Secondary | ICD-10-CM | POA: Diagnosis not present

## 2019-04-03 DIAGNOSIS — H04123 Dry eye syndrome of bilateral lacrimal glands: Secondary | ICD-10-CM | POA: Diagnosis not present

## 2019-04-03 DIAGNOSIS — H10413 Chronic giant papillary conjunctivitis, bilateral: Secondary | ICD-10-CM | POA: Diagnosis not present

## 2019-04-03 DIAGNOSIS — Z961 Presence of intraocular lens: Secondary | ICD-10-CM | POA: Diagnosis not present

## 2019-04-08 ENCOUNTER — Other Ambulatory Visit: Payer: Self-pay | Admitting: Internal Medicine

## 2019-04-18 ENCOUNTER — Telehealth: Payer: Self-pay | Admitting: Internal Medicine

## 2019-04-18 MED ORDER — FLUOXETINE HCL 20 MG PO CAPS: 20.0000 mg | ORAL_CAPSULE | Freq: Every day | ORAL | 1 refills | Status: DC

## 2019-04-18 MED ORDER — FAMOTIDINE 20 MG PO TABS: 20.0000 mg | ORAL_TABLET | Freq: Two times a day (BID) | ORAL | 3 refills | Status: DC

## 2019-04-18 NOTE — Telephone Encounter (Signed)
Rxs sent

## 2019-04-18 NOTE — Telephone Encounter (Signed)
Medication: FLUoxetine (PROZAC) 20 MG capsule ( Pt States he didn't get this one refill shows  04/10/19) )   famotidine (PEPCID) 20 MG tablet   Has the patient contacted their pharmacy? Yes.   (If no, request that the patient contact the pharmacy for the refill.) (If yes, when and what did the pharmacy advise?)  Preferred Pharmacy (with phone number or street name):   [place]  [address] [place] [address]  Phone:  [phone] Fax:  [phone]  Agent: Please be advised that RX refills may take up to 3 business days. We ask that you follow-up with your pharmacy.

## 2019-04-28 ENCOUNTER — Telehealth: Payer: Self-pay | Admitting: Internal Medicine

## 2019-04-28 NOTE — Chronic Care Management (AMB) (Signed)
  Chronic Care Management   Note  04/28/2019 Name: [name] MRN: [id number] DOB: August 31, 1926  [name] is a 86 y.o. year old male who is a primary care patient of [name], Alda Berthold, MD. I reached out to Winfred Leeds by phone today in response to a referral sent by [name] PCP, Colon Branch, MD.   [name] was given information about Chronic Care Management services today including:  1. CCM service includes personalized support from designated clinical staff supervised by his physician, including individualized plan of care and coordination with other care providers 2. 24/7 contact phone numbers for assistance for urgent and routine care needs. 3. Service will only be billed when office clinical staff spend 20 minutes or more in a month to coordinate care. 4. Only one practitioner may furnish and bill the service in a calendar month. 5. The patient may stop CCM services at any time (effective at the end of the month) by phone call to the office staff.   Patient agreed to services and verbal consent obtained.    This note is not being shared with the patient for the following reason: To respect privacy (The patient or proxy has requested that the information not be shared).  Follow up plan:   Raynicia Dukes [employer] Scheduler

## 2019-05-01 DIAGNOSIS — H903 Sensorineural hearing loss, bilateral: Secondary | ICD-10-CM | POA: Diagnosis not present

## 2019-05-04 ENCOUNTER — Encounter: Payer: Self-pay | Admitting: Podiatry

## 2019-05-04 ENCOUNTER — Ambulatory Visit (INDEPENDENT_AMBULATORY_CARE_PROVIDER_SITE_OTHER): Payer: Medicare Other | Admitting: Podiatry

## 2019-05-04 ENCOUNTER — Other Ambulatory Visit: Payer: Self-pay

## 2019-05-04 DIAGNOSIS — Q828 Other specified congenital malformations of skin: Secondary | ICD-10-CM | POA: Diagnosis not present

## 2019-05-04 DIAGNOSIS — M79676 Pain in unspecified toe(s): Secondary | ICD-10-CM

## 2019-05-04 DIAGNOSIS — B351 Tinea unguium: Secondary | ICD-10-CM | POA: Diagnosis not present

## 2019-05-04 DIAGNOSIS — E1142 Type 2 diabetes mellitus with diabetic polyneuropathy: Secondary | ICD-10-CM

## 2019-05-04 NOTE — Progress Notes (Signed)
He presents with his daughter from [place] today states that his toenails and his calluses are hurting.  He still has neuropathy and that they hurt occasionally.  She states that she is planning on taking him and his wife back to [place] for about 6 months.  Objective: Vital signs are stable alert and oriented x3.  Pulses are strongly palpable venous distention is noted neurologic sensorium is diminished per Semmes Weinstein monofilament muscle strength is 4 out of 5 toenails are thick yellow dystrophic clinically mycotic severe hammertoe deformities.  Multiple reactive hyperkeratoses bilateral.  Severe bunion deformities.  No open lesions or wounds are noted.  Assessment: Pain in limb secondary to onychomycosis and reactive hyperkeratotic tissue associated diabetic peripheral neuropathy and deformities.  Plan: Debrided all reactive hyperkeratotic tissue today debrided toenails 1 through 5 bilateral.

## 2019-05-05 ENCOUNTER — Ambulatory Visit (INDEPENDENT_AMBULATORY_CARE_PROVIDER_SITE_OTHER): Payer: Medicare Other | Admitting: Internal Medicine

## 2019-05-05 ENCOUNTER — Encounter: Payer: Self-pay | Admitting: Internal Medicine

## 2019-05-05 VITALS — BP 135/44 | HR 53 | Temp 96.2°F | Resp 18 | Ht 65.0 in | Wt 125.2 lb

## 2019-05-05 DIAGNOSIS — R739 Hyperglycemia, unspecified: Secondary | ICD-10-CM

## 2019-05-05 DIAGNOSIS — M5417 Radiculopathy, lumbosacral region: Secondary | ICD-10-CM | POA: Diagnosis not present

## 2019-05-05 MED ORDER — PREDNISONE 10 MG PO TABS: ORAL_TABLET | ORAL | 0 refills | Status: DC

## 2019-05-05 MED FILL — PREDNISONE 10 MG TABS: 10 | 9 days supply | Qty: 18 | Fill #0

## 2019-05-05 NOTE — Progress Notes (Signed)
Pre visit review using our clinic review tool, if applicable. No additional management support is needed unless otherwise documented below in the visit note. 

## 2019-05-05 NOTE — Patient Instructions (Addendum)
Stop Advil.  Avoid NSAIDs such as ibuprofen, naproxen.  Start prednisone for few days  Tylenol  500 mg OTC 2 tabs a day every 8 hours as needed for pain   Place a heating pad at the buttock twice a day  If possible, check the blood sugar with your wife's glucometer, if it goes over 200 let me know

## 2019-05-05 NOTE — Progress Notes (Signed)
Subjective:    Patient ID: [name], male    DOB: December 29, 1926, 86 y.o.   MRN: [id number]  DOS:  05/05/2019 Type of visit - description: Acute, here with his daughter who lives in [place] 2 to 3 weeks history of pain at the buttocks, L>> R with some radiation to the left leg, lateral aspect up to the knee and (less noticeable) discomfort  up to the ankle. The pain is described as shooting, changes with certain positions and walking and at times it has been intense    Review of Systems Denies any fall or injury No bladder or bowel incontinence No tingling per se or numbness of the lower extremities  Past Medical History:  Diagnosis Date  . Allergic rhinitis   . Anemia   . Anxiety   . BPH (benign prostatic hypertrophy)    with weak urinary stream  . Cholecystoduodenal fistula 2016   Dr. Georgette Dover  . Common bile duct stone   . Diabetes mellitus   . Elevated PSA   . Esophageal stricture   . GERD (gastroesophageal reflux disease)    w/ esoph stricture  . Hiatal hernia   . History of colonic polyps   . Hyperlipidemia   . Hypertension   . Inguinal hernia 2016   Dr. Georgette Dover, Pt does not want to proceed with repair at this time  . Internal hemorrhoids   . Osteoarthritis   . Polyneuropathy    NCS by neurology per pt 5/07  . Stroke [facility])    CVA, per CT "old stroke"    Past Surgical History:  Procedure Laterality Date  . CARDIOVASCULAR STRESS TEST  02-2010   done in [place], had CP, +EKG changes, neg nuclear  imagins  . CATARACT EXTRACTION Bilateral 01/2009  . CATARACT EXTRACTION, BILATERAL    . CHOLECYSTECTOMY N/A 08/16/2013   Procedure: LAPAROSCOPIC CONVERTED TO OPEN  CHOLECYSTECTOMY WITH INTRAOPERATIVE CHOLANGIOGRAM;  Surgeon: Imogene Burn. Georgette Dover, MD;  Location: [place];  Service: General;  Laterality: N/A;  . ENT  surgery for snoring remotely (Dr Lucia Gaskins)    . ERCP W/ SPHICTEROTOMY     ????? Dr Deatra Ina  . HERNIA REPAIR     bil inguinal- per pt L in 90s, R in the 80s  . TOTAL KNEE  ARTHROPLASTY Right 05/2006  . TOTAL KNEE ARTHROPLASTY Left 08/2006    Allergies as of 05/05/2019      Reactions   Amoxicillin Other (See Comments)   "loose motions" "giddiness" Has patient had a PCN reaction causing immediate rash, facial/tongue/throat swelling, SOB or lightheadedness with hypotension: No Has patient had a PCN reaction causing severe rash involving mucus membranes or skin necrosis: No Has patient had a PCN reaction that required hospitalization No Has patient had a PCN reaction occurring within the last 10 years: No If all of the above answers are "NO", then may proceed with Cephalosporin use.      Medication List       Accurate as of May 05, 2019 11:59 PM. If you have any questions, ask your nurse or doctor.        alfuzosin 10 MG 24 hr tablet Commonly known as: UROXATRAL Take 10 mg by mouth daily.   AMBULATORY NON FORMULARY MEDICATION Medication Name: Abdominal Binder Dx: Ventral Hernia (K43.9)   amLODipine 5 MG tablet Commonly known as: NORVASC Take 1 tablet (5 mg total) by mouth daily.   aspirin EC 81 MG tablet Take 81 mg by mouth daily.   atenolol 50  MG tablet Commonly known as: TENORMIN Take 0.5 tablets (25 mg total) by mouth daily.   Centrum Silver tablet Take 1 tablet by mouth daily.   donepezil 5 MG tablet Commonly known as: Aricept Take 1 tablet (5 mg total) by mouth at bedtime.   famotidine 20 MG tablet Commonly known as: Pepcid Take 1 tablet (20 mg total) by mouth 2 (two) times daily.   finasteride 5 MG tablet Commonly known as: PROSCAR Take 1 tablet (5 mg total) by mouth daily.   FLUoxetine 20 MG capsule Commonly known as: PROZAC Take 1 capsule (20 mg total) by mouth daily.   losartan 50 MG tablet Commonly known as: COZAAR Take 1 tablet (50 mg total) by mouth daily.   pantoprazole 40 MG tablet Commonly known as: PROTONIX TAKE 1 TABLET BY MOUTH  DAILY   predniSONE 10 MG tablet Commonly known as: DELTASONE 3 tabs x 3  days, 2 tabs x 3 days, 1 tab x 3 days Started by: Kathlene November, MD   simvastatin 10 MG tablet Commonly known as: ZOCOR Take 1 tablet (10 mg total) by mouth at bedtime.   tamsulosin 0.4 MG Caps capsule Commonly known as: FLOMAX   valsartan 160 MG tablet Commonly known as: DIOVAN   Vitamin D (Ergocalciferol) 1.25 MG (50000 UNIT) Caps capsule Commonly known as: DRISDOL Take 1 capsule (50,000 Units total) by mouth every 7 (seven) days.          Objective:   Physical Exam BP (!) 135/44 (BP Location: Right Arm, Patient Position: Sitting, Cuff Size: Small)   Pulse (!) 53   Temp (!) 96.2 F (35.7 C) (Temporal)   Resp 18   Ht 5\' 5"  (1.651 m)   Wt 125 lb 4 oz (56.8 kg)   SpO2 98%   BMI 20.84 kg/m  General:   Well developed, NAD, BMI noted. HEENT:  Normocephalic . Face symmetric, atraumatic MSK: Slightly TTP at the posterior aspect of the left hip. Lower extremities: Good pedal pulses, no edema Skin: Not pale. Not jaundice Neurologic:  alert & oriented X3.  Speech normal, gait appropriate for age and unassisted.  Gait is not not particularly antalgic, needs some help transferring to the table. DTRs symmetric Motor symmetric Straight leg test negative Psych--  Cognition and judgment appear intact.  Cooperative with normal attention span and concentration.  Behavior appropriate. No anxious or depressed appearing.      Assessment       Assessment  Prediabetes Polyneuropathy by NCS, saw neurology 2007  HTN Hyperlipidemia Anxiety DJD: See surgeries GERD with esophageal stricture, HH H/o anemia  BPH, increase PSA, sees urology H/o Stroke ("old stroke" per CT) H/o  hernia, 2016, declined repair Admitted 04/2015: UTI, sepsis, Escherichia coli HOH tried 3 different hearing aids before OSA ?  PLAN Radiculopathy: Suspect L5 radiculopathy based on symptoms, neuro exam negative. No red flags. Plan: Prednisone for few days, stop Advil which he is taking, Tylenol is  okay, heating pad.  Call if not better. hyperglycemia: Last A1c 5.7, does not have his own glucometer but his wife has, recommend to check CBGs if possible. Call if not gradually better   This visit occurred during the SARS-CoV-2 public health emergency.  Safety protocols were in place, including screening questions prior to the visit, additional usage of staff PPE, and extensive cleaning of exam room while observing appropriate contact time as indicated for disinfecting solutions.

## 2019-05-06 NOTE — Assessment & Plan Note (Signed)
Radiculopathy: Suspect L5 radiculopathy based on symptoms, neuro exam negative. No red flags. Plan: Prednisone for few days, stop Advil which he is taking, Tylenol is okay, heating pad.  Call if not better. hyperglycemia: Last A1c 5.7, does not have his own glucometer but his wife has, recommend to check CBGs if possible. Call if not gradually better

## 2019-05-08 DIAGNOSIS — H905 Unspecified sensorineural hearing loss: Secondary | ICD-10-CM | POA: Diagnosis not present

## 2019-05-24 ENCOUNTER — Telehealth: Payer: Self-pay | Admitting: Internal Medicine

## 2019-05-24 ENCOUNTER — Other Ambulatory Visit: Payer: Self-pay

## 2019-05-24 DIAGNOSIS — E785 Hyperlipidemia, unspecified: Secondary | ICD-10-CM

## 2019-05-24 DIAGNOSIS — I10 Essential (primary) hypertension: Secondary | ICD-10-CM

## 2019-05-24 NOTE — Progress Notes (Signed)
°  Chronic Care Management   Note  05/24/2019 Name: ABUBAKARR BROXTERMAN MRN: [id number] DOB: 1926/06/27  MORRIE SEBA is a 86 y.o. year old male who is a primary care patient of [name], Alda Berthold, MD. I reached out to Winfred Leeds by phone today in response to a referral sent by Mr. Blenda Peals PCP, Colon Branch, MD.   Mr. Steeley was given information about Chronic Care Management services today including:  1. CCM service includes personalized support from designated clinical staff supervised by his physician, including individualized plan of care and coordination with other care providers 2. 24/7 contact phone numbers for assistance for urgent and routine care needs. 3. Service will only be billed when office clinical staff spend 20 minutes or more in a month to coordinate care. 4. Only one practitioner may furnish and bill the service in a calendar month. 5. The patient may stop CCM services at any time (effective at the end of the month) by phone call to the office staff.   Patient agreed to services and verbal consent obtained.   This note is not being shared with the patient for the following reason: To respect privacy (The patient or proxy has requested that the information not be shared). Follow up plan:   Earney [facility] Upstream Scheduler

## 2019-05-25 ENCOUNTER — Encounter: Payer: Self-pay | Admitting: Internal Medicine

## 2019-05-25 ENCOUNTER — Ambulatory Visit (INDEPENDENT_AMBULATORY_CARE_PROVIDER_SITE_OTHER): Payer: Medicare Other | Admitting: Internal Medicine

## 2019-05-25 ENCOUNTER — Other Ambulatory Visit: Payer: Self-pay

## 2019-05-25 ENCOUNTER — Ambulatory Visit (HOSPITAL_BASED_OUTPATIENT_CLINIC_OR_DEPARTMENT_OTHER)
Admission: RE | Admit: 2019-05-25 | Discharge: 2019-05-25 | Disposition: A | Discharge status: Home / Self Care | Payer: Medicare Other | Source: Ambulatory Visit | Attending: Internal Medicine | Admitting: Internal Medicine

## 2019-05-25 VITALS — BP 151/59 | HR 61 | Temp 95.8°F | Resp 16 | Ht 65.0 in | Wt 124.2 lb

## 2019-05-25 DIAGNOSIS — M545 Low back pain, unspecified: Secondary | ICD-10-CM

## 2019-05-25 DIAGNOSIS — E559 Vitamin D deficiency, unspecified: Secondary | ICD-10-CM

## 2019-05-25 DIAGNOSIS — F0390 Unspecified dementia without behavioral disturbance: Secondary | ICD-10-CM

## 2019-05-25 NOTE — Assessment & Plan Note (Signed)
Radiculopathy: Was seen 05/05/2019, at the time I suspected back pain with radiculopathy, he improved with prednisone but symptoms have resurfaced. This time in addition to back pain and radiation to the left leg, he also has radiation to the right leg. He has a history of quite elevated PSA. Neurological exam benign. Plan:  X-ray, refer to Ortho, might benefit from MRI and local injections Continue Tylenol, also warm compresses and topical Voltaren. We talk about stronger pain medication but we agreed not to pursue that at this point. Dementia: The daughter has questions about the dx,, the issue was discussed, he was prescribed Aricept 5 mg few months ago, and daughter will make sure he is taking it, consider increase dose of 10 mg. Mental status he has been stable in the last few months. Vitamin D deficiency: Reportedly took ergocalciferol, rec OTC vitamin D. RTC 3 months

## 2019-05-25 NOTE — Progress Notes (Signed)
Pre visit review using our clinic review tool, if applicable. No additional management support is needed unless otherwise documented below in the visit note. 

## 2019-05-25 NOTE — Patient Instructions (Signed)
For back pain:  Tylenol  500 mg OTC 2 tabs a day every 8 hours as needed for pain  Apply a heating pad twice a day  Get over-the-counter Voltaren gel: Apply to 3 times a day where the pain is.  Be sure you take over-the-counter vitamin D 1000 units: 1 tablet daily  For memory, you are supposed to be taking Aricept 5 mg also know as donepezil.  If you are not let me know  GO TO THE FRONT DESK, [place] back for   a checkup in 3 months   STOP BY THE FIRST FLOOR:  get the XR

## 2019-05-25 NOTE — Progress Notes (Signed)
Subjective:    Patient ID: [name], male    DOB: [birth year], 86 y.o.   MRN: [id number]  DOS:  05/25/2019 Type of visit - description: Acute, here with his daughter Main concern is back pain, it is actually located near the gluteal area. Symptoms started approximately mid April, was seen virtually and prescribed prednisone which helped. However the pain has resurfaced, L>R, worse with certain movements like walking, also present at night. Tylenol does help. Some radiation to the left leg all the way to the ankle and also now occasional radiation to the right leg.  We also talk about memory issues, vitamin D.  Review of Systems Denies fever chills No other unusual aches pains or myalgias.  Past Medical History:  Diagnosis Date  . Allergic rhinitis   . Anemia   . Anxiety   . BPH (benign prostatic hypertrophy)    with weak urinary stream  . Cholecystoduodenal fistula 2016   Dr. Georgette Dover  . Common bile duct stone   . Diabetes mellitus   . Elevated PSA   . Esophageal stricture   . GERD (gastroesophageal reflux disease)    w/ esoph stricture  . Hiatal hernia   . History of colonic polyps   . Hyperlipidemia   . Hypertension   . Inguinal hernia 2016   Dr. Georgette Dover, Pt does not want to proceed with repair at this time  . Internal hemorrhoids   . Osteoarthritis   . Polyneuropathy    NCS by neurology per pt 5/07  . Stroke [facility])    CVA, per CT "old stroke"    Past Surgical History:  Procedure Laterality Date  . CARDIOVASCULAR STRESS TEST  02-2010   done in [place], had CP, +EKG changes, neg nuclear  imagins  . CATARACT EXTRACTION Bilateral 01/2009  . CATARACT EXTRACTION, BILATERAL    . CHOLECYSTECTOMY N/A 08/16/2013   Procedure: LAPAROSCOPIC CONVERTED TO OPEN  CHOLECYSTECTOMY WITH INTRAOPERATIVE CHOLANGIOGRAM;  Surgeon: Imogene Burn. Georgette Dover, MD;  Location: [place];  Service: General;  Laterality: N/A;  . ENT  surgery for snoring remotely (Dr Lucia Gaskins)    . ERCP W/ SPHICTEROTOMY     ????? Dr Deatra Ina  . HERNIA REPAIR     bil inguinal- per pt L in 90s, R in the 80s  . TOTAL KNEE ARTHROPLASTY Right 05/2006  . TOTAL KNEE ARTHROPLASTY Left 08/2006    Allergies as of 05/25/2019      Reactions   Amoxicillin Other (See Comments)   "loose motions" "giddiness" Has patient had a PCN reaction causing immediate rash, facial/tongue/throat swelling, SOB or lightheadedness with hypotension: No Has patient had a PCN reaction causing severe rash involving mucus membranes or skin necrosis: No Has patient had a PCN reaction that required hospitalization No Has patient had a PCN reaction occurring within the last 10 years: No If all of the above answers are "NO", then may proceed with Cephalosporin use.      Medication List       Accurate as of May 25, 2019  9:53 PM. If you have any questions, ask your nurse or doctor.        STOP taking these medications   predniSONE 10 MG tablet Commonly known as: DELTASONE Stopped by: Kathlene November, MD   valsartan 160 MG tablet Commonly known as: DIOVAN Stopped by: Kathlene November, MD   Vitamin D (Ergocalciferol) 1.25 MG (50000 UNIT) Caps capsule Commonly known as: DRISDOL Stopped by: Kathlene November, MD     TAKE these  medications   alfuzosin 10 MG 24 hr tablet Commonly known as: UROXATRAL Take 10 mg by mouth daily.   AMBULATORY NON FORMULARY MEDICATION Medication Name: Abdominal Binder Dx: Ventral Hernia (K43.9)   amLODipine 5 MG tablet Commonly known as: NORVASC Take 1 tablet (5 mg total) by mouth daily.   aspirin EC 81 MG tablet Take 81 mg by mouth daily.   atenolol 50 MG tablet Commonly known as: TENORMIN Take 0.5 tablets (25 mg total) by mouth daily.   Centrum Silver tablet Take 1 tablet by mouth daily.   cholecalciferol 25 MCG (1000 UNIT) tablet Commonly known as: VITAMIN D3 Take 1,000 Units by mouth daily.   donepezil 5 MG tablet Commonly known as: Aricept Take 1 tablet (5 mg total) by mouth at bedtime.   famotidine 20 MG  tablet Commonly known as: Pepcid Take 1 tablet (20 mg total) by mouth 2 (two) times daily.   finasteride 5 MG tablet Commonly known as: PROSCAR Take 1 tablet (5 mg total) by mouth daily.   FLUoxetine 20 MG capsule Commonly known as: PROZAC Take 1 capsule (20 mg total) by mouth daily.   losartan 50 MG tablet Commonly known as: COZAAR Take 1 tablet (50 mg total) by mouth daily.   pantoprazole 40 MG tablet Commonly known as: PROTONIX TAKE 1 TABLET BY MOUTH  DAILY   simvastatin 10 MG tablet Commonly known as: ZOCOR Take 1 tablet (10 mg total) by mouth at bedtime.   tamsulosin 0.4 MG Caps capsule Commonly known as: FLOMAX          Objective:   Physical Exam BP (!) 151/59 (BP Location: Left Arm, Patient Position: Sitting, Cuff Size: Small)   Pulse 61   Temp (!) 95.8 F (35.4 C) (Temporal)   Resp 16   Ht 5\' 5"  (1.651 m)   Wt 124 lb 4 oz (56.4 kg)   SpO2 97%   BMI 20.68 kg/m  General:   Well developed, NAD, BMI noted. HEENT:  Normocephalic . Face symmetric, atraumatic Lungs:  CTA B Normal respiratory effort, no intercostal retractions, no accessory muscle use. Heart: RRR,  no murmur.  Lower extremities: no pretibial edema bilaterally MSK: Slightly TTP at lower back bilaterally Skin: Not pale. Not jaundice Neurologic:  alert, cooperative.  No formal mental exam normal otherwise. Speech normal, gait appropriate for age, needs help transferring. DTRs symmetric. Straight leg test negative Psych--  Behavior appropriate. No anxious or depressed appearing.      Assessment    Assessment  Prediabetes Polyneuropathy by NCS, saw neurology 2007  HTN Hyperlipidemia Anxiety DJD: See surgeries GERD with esophageal stricture, HH H/o anemia  BPH, increase PSA, sees urology H/o Stroke ("old stroke" per CT) H/o  hernia, 2016, declined repair Admitted 04/2015: UTI, sepsis, Escherichia coli HOH tried 3 different hearing aids before OSA ?  PLAN Radiculopathy: Was  seen 05/05/2019, at the time I suspected back pain with radiculopathy, he improved with prednisone but symptoms have resurfaced. This time in addition to back pain and radiation to the left leg, he also has radiation to the right leg. He has a history of quite elevated PSA. Neurological exam benign. Plan:  X-ray, refer to Ortho, might benefit from MRI and local injections Continue Tylenol, also warm compresses and topical Voltaren. We talk about stronger pain medication but we agreed not to pursue that at this point. Dementia: The daughter has questions about the dx,, the issue was discussed, he was prescribed Aricept 5 mg few months ago, and  daughter will make sure he is taking it, consider increase dose of 10 mg. Mental status he has been stable in the last few months. Vitamin D deficiency: Reportedly took ergocalciferol, rec OTC vitamin D. RTC 3 months   This visit occurred during the SARS-CoV-2 public health emergency.  Safety protocols were in place, including screening questions prior to the visit, additional usage of staff PPE, and extensive cleaning of exam room while observing appropriate contact time as indicated for disinfecting solutions.

## 2019-05-26 ENCOUNTER — Telehealth: Payer: Medicare Other

## 2019-05-29 ENCOUNTER — Telehealth: Payer: Self-pay

## 2019-05-29 MED ORDER — SIMVASTATIN 10 MG PO TABS: 10.0000 mg | ORAL_TABLET | Freq: Every day | ORAL | 1 refills | Status: DC

## 2019-05-29 MED ORDER — ATENOLOL 50 MG PO TABS: 25.0000 mg | ORAL_TABLET | Freq: Every day | ORAL | 1 refills | Status: DC

## 2019-05-29 MED ORDER — FLUOXETINE HCL 20 MG PO CAPS: 20.0000 mg | ORAL_CAPSULE | Freq: Every day | ORAL | 1 refills | Status: DC

## 2019-05-29 MED ORDER — AMLODIPINE BESYLATE 5 MG PO TABS: 5.0000 mg | ORAL_TABLET | Freq: Every day | ORAL | 1 refills | Status: DC

## 2019-05-29 MED ORDER — LOSARTAN POTASSIUM 50 MG PO TABS: 50.0000 mg | ORAL_TABLET | Freq: Every day | ORAL | 1 refills | Status: DC

## 2019-05-29 NOTE — Telephone Encounter (Signed)
Pt called regarding x-ray results. Informed of x-ray results. He is also requesting meds to be refilled to [facility].

## 2019-05-30 ENCOUNTER — Telehealth: Payer: Self-pay | Admitting: Internal Medicine

## 2019-05-30 NOTE — Telephone Encounter (Signed)
Caller: [name] Call back phone number: [phone]  Patient would like to speak to you in regards to the orthopedic referral. He insists that you call him.  Please advise.

## 2019-05-30 NOTE — Telephone Encounter (Signed)
Spoke w/ [name]- she informed that Pt hasn't received call about ortho referral. Informed that referral was placed on 05/26/2019 it may take them several days to call them. [name] verbalized understanding.

## 2019-06-01 ENCOUNTER — Telehealth: Payer: Self-pay

## 2019-06-01 NOTE — Telephone Encounter (Signed)
Spoke w/ Pt- he received an email from [facility] about a cancellation on meds- he wanted to check because he then received another email stating that his medications have been shipped to him and were supposed to be at his home tomorrow. Informed I wasn't sure of any cancellations but that I did receive a refill request on 5/24 for several meds that I sent in. Pt verbalized understanding.

## 2019-06-01 NOTE — Telephone Encounter (Signed)
Patient called in needing to speak with the nurse or Dr. [name] about his medication Please call the patient back at [phone]

## 2019-06-06 ENCOUNTER — Telehealth: Payer: Self-pay

## 2019-06-06 NOTE — Telephone Encounter (Addendum)
Patient called in to see if he could speak to the nurse about his medication. Please give the patient a call as soon as possible at [phone] to advise about his medication.

## 2019-06-07 MED ORDER — DONEPEZIL HCL 10 MG PO TABS
10.0000 mg | ORAL_TABLET | Freq: Every day | ORAL | 1 refills | Status: DC
Start: 2019-06-07 — End: 2019-06-26

## 2019-06-07 NOTE — Telephone Encounter (Signed)
Rx sent 

## 2019-06-07 NOTE — Telephone Encounter (Signed)
Spoke w/ Pt- he is interested in increasing donepezil to 10mg  as discussed at last OV. He is requesting to be sent to [facility]. Please advise if okay to send.

## 2019-06-07 NOTE — Telephone Encounter (Signed)
Send a prescription for donepezil 10 mg 1 tablet daily, 17-month supply

## 2019-06-09 DIAGNOSIS — M545 Low back pain: Secondary | ICD-10-CM | POA: Diagnosis not present

## 2019-06-09 DIAGNOSIS — M48062 Spinal stenosis, lumbar region with neurogenic claudication: Secondary | ICD-10-CM | POA: Diagnosis not present

## 2019-06-09 MED FILL — predniSONE 5 MG TABS: 5 | 6 days supply | Qty: 21 | Fill #0

## 2019-06-26 ENCOUNTER — Other Ambulatory Visit: Payer: Self-pay | Admitting: *Deleted

## 2019-06-26 MED ORDER — FINASTERIDE 5 MG PO TABS: 5.0000 mg | ORAL_TABLET | Freq: Every day | ORAL | 0 refills | Status: DC

## 2019-06-26 MED ORDER — AMLODIPINE BESYLATE 5 MG PO TABS: 5.0000 mg | ORAL_TABLET | Freq: Every day | ORAL | 0 refills | Status: DC

## 2019-06-26 MED ORDER — SIMVASTATIN 10 MG PO TABS: 10.0000 mg | ORAL_TABLET | Freq: Every day | ORAL | 0 refills | Status: DC

## 2019-06-26 MED ORDER — FAMOTIDINE 20 MG PO TABS: 20.0000 mg | ORAL_TABLET | Freq: Two times a day (BID) | ORAL | 0 refills | Status: DC

## 2019-06-26 MED ORDER — ATENOLOL 50 MG PO TABS: 25.0000 mg | ORAL_TABLET | Freq: Every day | ORAL | 0 refills | Status: DC

## 2019-06-26 MED ORDER — DONEPEZIL HCL 10 MG PO TABS: 10.0000 mg | ORAL_TABLET | Freq: Every day | ORAL | 1 refills | Status: DC

## 2019-06-26 MED ORDER — PANTOPRAZOLE SODIUM 40 MG PO TBEC: 40.0000 mg | DELAYED_RELEASE_TABLET | Freq: Every day | ORAL | 0 refills | Status: DC

## 2019-06-26 MED ORDER — ASPIRIN EC 81 MG PO TBEC: 81.0000 mg | DELAYED_RELEASE_TABLET | Freq: Every day | ORAL | 0 refills | Status: AC

## 2019-06-26 MED ORDER — LOSARTAN POTASSIUM 50 MG PO TABS: 50.0000 mg | ORAL_TABLET | Freq: Every day | ORAL | 0 refills | Status: DC

## 2019-06-26 MED ORDER — FLUOXETINE HCL 20 MG PO CAPS: 20.0000 mg | ORAL_CAPSULE | Freq: Every day | ORAL | 0 refills | Status: DC

## 2019-06-26 NOTE — Telephone Encounter (Signed)
Patient per daughter they need refills for 6 months because [place] are [place].  Rx sent in for 6 months

## 2019-06-27 MED FILL — FAMOTIDINE 20 MG TABS: 20 | 90 days supply | Qty: 180 | Fill #0

## 2019-06-27 MED FILL — FLUoxetine HCL 20 MG CAPS: 20 | 180 days supply | Qty: 180 | Fill #0

## 2019-06-27 MED FILL — LOSARTAN POTASSIUM 50 MG TA: 50 | 180 days supply | Qty: 180 | Fill #0

## 2019-06-27 MED FILL — ATENOLOL 50 MG TABLET: 50 | 180 days supply | Qty: 90 | Fill #0

## 2019-06-27 MED FILL — AMLODIPINE BESYLATE 5 MG TA: 5 | 180 days supply | Qty: 180 | Fill #0

## 2019-06-27 MED FILL — SIMVASTATIN 10 MG TAB: 10 | 180 days supply | Qty: 180 | Fill #0

## 2019-06-27 MED FILL — PANTOPRAZOLE SOD DR 40 MG T: 40 | 180 days supply | Qty: 180 | Fill #0

## 2019-06-27 MED FILL — DONEPEZIL HCL 10 MG TABLET: 10 | 90 days supply | Qty: 90 | Fill #0

## 2019-06-27 MED FILL — FINASTERIDE 5 MG TABLET: 5 | 180 days supply | Qty: 180 | Fill #0

## 2019-06-28 ENCOUNTER — Ambulatory Visit: Payer: Medicare Other | Attending: Internal Medicine

## 2019-06-28 DIAGNOSIS — Z20822 Contact with and (suspected) exposure to covid-19: Secondary | ICD-10-CM

## 2019-06-29 LAB — SARS-COV-2, NAA 2 DAY TAT

## 2019-06-29 LAB — NOVEL CORONAVIRUS, NAA: SARS-CoV-2, NAA: NOT DETECTED

## 2019-07-14 ENCOUNTER — Telehealth: Payer: Medicare Other

## 2019-07-14 NOTE — Chronic Care Management (AMB) (Deleted)
Chronic Care Management Pharmacy  Name: [name]  MRN: [id number] DOB: April 20, 1926  Chief Complaint/ HPI  [name],  86 y.o. , male presents for their Initial CCM visit with the clinical pharmacist via telephone due to COVID-19 Pandemic.  PCP : Colon Branch, MD  Their chronic conditions include: {CHL AMB CHRONIC MEDICAL CONDITIONS:[phone]}  Office Visits: 06/07/19: Request for increase in donepezil. Dose increased to 1m and sent to pharmacy.  05/25/19: Visit w/ Dr. PLarose Kells- Pt with daughter. Low back pain - Xray and referral to ortho; continue tylenol and warm compress, topical voltaren. memory concerns - consider increase to donepezil 146m OTC vitamin D recommended for vit. D deficiency. RTC 3 months.  05/05/19: Visit w/ Dr. PaLarose Kells  Pt with daughter. Low back pain - prednisone for few days. Stop advil while taking. Tylenol ok. Recommend checking BGs if possible.  02/10/19: Visit w/ Dr. PaLarose Kells Pt with wife. Dementia w/o behavioral disturbance. MMSE 23 (mild dementia). Start donepezil 94m54maily and reassess in 3 months. Pain between should blades - Xray. RTC in 3 months.   02/03/19: Visit w/ Dr. PazLarose KellsBack pain self resolving, observation for now. Office visit for dementia concern.   Consult Visit: 05/04/19: Podiatry visit w/ Dr. HyaMilinda PointerDebrided all reactive hyperkeratotic tissue.   04/03/19: Optho visit w/ Dr. GroKaty Fitchedications: Outpatient Encounter Medications as of 07/14/2019  Medication Sig  . alfuzosin (UROXATRAL) 10 MG 24 hr tablet Take 10 mg by mouth daily.  . AMBULATORY NON FORMULARY MEDICATION Medication Name: Abdominal Binder Dx: Ventral Hernia (K43.9)  . amLODipine (NORVASC) 5 MG tablet Take 1 tablet (5 mg total) by mouth daily.  . aMarland Kitchenpirin EC 81 MG tablet Take 1 tablet (81 mg total) by mouth daily.  . aMarland Kitchenenolol (TENORMIN) 50 MG tablet Take 0.5 tablets (25 mg total) by mouth daily.  . cholecalciferol (VITAMIN D3) 25 MCG (1000 UNIT) tablet Take 1,000 Units by mouth  daily.  . dMarland Kitchennepezil (ARICEPT) 10 MG tablet Take 1 tablet (10 mg total) by mouth at bedtime.  . famotidine (PEPCID) 20 MG tablet Take 1 tablet (20 mg total) by mouth 2 (two) times daily.  . finasteride (PROSCAR) 5 MG tablet Take 1 tablet (5 mg total) by mouth daily.  . FMarland KitchenUoxetine (PROZAC) 20 MG capsule Take 1 capsule (20 mg total) by mouth daily.  . lMarland Kitchensartan (COZAAR) 50 MG tablet Take 1 tablet (50 mg total) by mouth daily.  . Multiple Vitamins-Minerals (CENTRUM SILVER) tablet Take 1 tablet by mouth daily.  . pantoprazole (PROTONIX) 40 MG tablet Take 1 tablet (40 mg total) by mouth daily.  . simvastatin (ZOCOR) 10 MG tablet Take 1 tablet (10 mg total) by mouth at bedtime.  . tamsulosin (FLOMAX) 0.4 MG CAPS capsule    No facility-administered encounter medications on file as of 07/14/2019.     Current Diagnosis/Assessment:  Goals Addressed   None     Pre-Diabetes   Recent Relevant Labs: Lab Results  Component Value Date/Time   HGBA1C 5.7 08/03/2018 09:19 AM   HGBA1C 5.9 05/03/2017 08:16 AM     Checking BG: {CHL HP Blood Glucose Monitoring [phone]}  Recent FBG Readings: Recent pre-meal BG readings: *** Recent 2hr PP BG readings:  *** Recent HS BG readings: *** Patient has failed these meds in past: *** Patient is currently {CHL Controlled/[phone]} on the following medications: ***  Last diabetic Foot exam:  Lab Results  Component Value Date/Time   HMDIABEYEEXA No Retinopathy 11/06/2014  12:00 AM    Last diabetic Eye exam:  Lab Results  Component Value Date/Time   HMDIABFOOTEX yes 02/19/2009 12:00 AM     We discussed: {CHL HP Upstream Pharmacy discussion:[phone]}  Plan  Continue {CHL HP Upstream Pharmacy Plans:[phone]}   Hypertension   Blood Pressure Goal: <140/90  Office blood pressures are  BP Readings from Last 3 Encounters:  05/25/19 (!) 151/59  05/05/19 (!) 135/44  02/10/19 (!) 142/48   CMP Latest Ref Rng & Units  02/13/2019 08/03/2018 10/11/2017  Glucose 70 - 99 mg/dL 152(H) 111(H) 142(H)  BUN 6 - 23 mg/dL _0 Creatinine 0.40 - 1.50 mg/dL 1.07 1.19 1.16  Sodium 135 - 145 mEq/L 132(L) 132(L) 131(L)  Potassium 3.5 - 5.1 mEq/L 3.8 4.5 4.3  Chloride 96 - 112 mEq/L 98 98 99  CO2 19 - 32 mEq/L _1 Calcium 8.4 - 10.5 mg/dL 8.7 9.6 9.0  Total Protein 6.0 - 8.3 g/dL - 7.2 -  Total Bilirubin 0.2 - 1.2 mg/dL - 1.7(H) -  Alkaline Phos 39 - 117 U/L - 42 -  AST 0 - 37 U/L - 19 -  ALT 0 - 53 U/L - 11 -    Patient has failed these meds in the past: *** Patient is currently {CHL Controlled/Uncontrolled:[phone]} on the following medications: ***   Patient checks BP at home {CHL HP BP Monitoring Frequency:[id number]}  Patient home BP readings are ranging: ***  We discussed {CHL HP Upstream Pharmacy discussion:[phone]}  Plan  Continue {CHL HP Upstream Pharmacy Plans:[phone]}   Hyperlipidemia   LDL goal < ***  Lipid Panel     Component Value Date/Time   CHOL 104 08/03/2018 0919   TRIG 119.0 08/03/2018 0919   HDL 42.90 08/03/2018 0919   LDLCALC 37 08/03/2018 0919    Hepatic Function Latest Ref Rng & Units 08/03/2018 05/03/2017 06/11/2016  Total Protein 6.0 - 8.3 g/dL 7.2 - 7.7  Albumin 3.5 - 5.2 g/dL 4.5 - 4.1  AST 0 - 37 U/L _2 ALT 0 - 53 U/L _3 Alk Phosphatase 39 - 117 U/L 42 - 44  Total Bilirubin 0.2 - 1.2 mg/dL 1.7(H) - 1.1  Bilirubin, Direct 0.0 - 0.3 mg/dL - - -     The ASCVD Risk score (Starke., et al., 2013) failed to calculate for the following reasons:   The 2013 ASCVD risk score is only valid for ages 59 to 85   The patient has a prior MI or stroke diagnosis   Patient has failed these meds in past: *** Patient is currently {CHL Controlled/Uncontrolled:[phone]} on the following medications:  . ***  We discussed:  {CHL HP Upstream Pharmacy discussion:[phone]}  Plan  Continue {CHL HP Upstream Pharmacy Plans:[phone]}   Memory     Patient has failed these meds in past: *** Patient is currently {CHL Controlled/Uncontrolled:[phone]} on the following medications: ***  We discussed:  {CHL HP Upstream Pharmacy discussion:[phone]}  Plan  Continue {CHL HP Upstream Pharmacy Plans:[phone]}   BPH    Patient has failed these meds in past: *** Patient is currently {CHL Controlled/Uncontrolled:[phone]} on the following medications: ***  We discussed:  {CHL HP Upstream Pharmacy discussion:[phone]}  Plan  Continue {CHL HP Upstream Pharmacy ZOXWR:[id number]}  Depression    Patient has failed these meds in past: *** Patient is currently {CHL Controlled/Uncontrolled:[phone]} on the following medications: ***  We discussed:  {CHL HP Upstream Pharmacy discussion:[phone]}  Plan  Continue {CHL HP  Upstream Pharmacy MNOTR:[id number]}   GERD    Patient has failed these meds in past: *** Patient is currently {CHL Controlled/Uncontrolled:[phone]} on the following medications: ***  We discussed:  {CHL HP Upstream Pharmacy discussion:[phone]}  Plan  Continue {CHL HP Upstream Pharmacy BFXOV:[id number]}

## 2019-07-20 ENCOUNTER — Other Ambulatory Visit: Payer: Self-pay

## 2019-07-20 ENCOUNTER — Ambulatory Visit: Payer: Medicare Other | Admitting: Pharmacist

## 2019-07-20 NOTE — Chronic Care Management (AMB) (Signed)
Chronic Care Management Pharmacy  Name: [name]  MRN: [id number] DOB: May 19, 1926  Chief Complaint/ HPI  [name],  86 y.o. , male presents for their Initial CCM visit with the clinical pharmacist via telephone due to COVID-19 Pandemic.  PCP : Colon Branch, MD  Their chronic conditions include: Pre-Diabetes, Hypertension, Hyperlipidemia/Hx of CVA, Memory Concern, BPH, Depression, GERD  Office Visits: 06/07/19: Request for increase in donepezil. Dose increased to 58m and sent to pharmacy.  05/25/19: Visit w/ Dr. PLarose Kells- Pt with daughter. Low back pain - Xray and referral to ortho; continue tylenol and warm compress, topical voltaren. memory concerns - consider increase to donepezil 123m OTC vitamin D recommended for vit. D deficiency. RTC 3 months.  05/05/19: Visit w/ Dr. PaLarose Kells  Pt with daughter. Low back pain - prednisone for few days. Stop advil while taking. Tylenol ok. Recommend checking BGs if possible.  02/10/19: Visit w/ Dr. PaLarose Kells Pt with wife. Dementia w/o behavioral disturbance. MMSE 23 (mild dementia). Start donepezil 85m72maily and reassess in 3 months. Pain between should blades - Xray. RTC in 3 months.   02/03/19: Visit w/ Dr. PazLarose KellsBack pain self resolving, observation for now. Office visit for dementia concern.   Consult Visit: 05/04/19: Podiatry visit w/ Dr. HyaMilinda PointerDebrided all reactive hyperkeratotic tissue.   04/03/19: Optho visit w/ Dr. GroKaty Fitchedications: Outpatient Encounter Medications as of 07/20/2019  Medication Sig  . amLODipine (NORVASC) 5 MG tablet Take 1 tablet (5 mg total) by mouth daily.  . aMarland Kitchenpirin EC 81 MG tablet Take 1 tablet (81 mg total) by mouth daily.  . aMarland Kitchenenolol (TENORMIN) 50 MG tablet Take 0.5 tablets (25 mg total) by mouth daily.  . cholecalciferol (VITAMIN D3) 25 MCG (1000 UNIT) tablet Take 1,000 Units by mouth daily.  . dMarland Kitchennepezil (ARICEPT) 10 MG tablet Take 1 tablet (10 mg total) by mouth at bedtime.  . famotidine (PEPCID) 20 MG tablet  Take 1 tablet (20 mg total) by mouth 2 (two) times daily.  . finasteride (PROSCAR) 5 MG tablet Take 1 tablet (5 mg total) by mouth daily.  . FMarland KitchenUoxetine (PROZAC) 20 MG capsule Take 1 capsule (20 mg total) by mouth daily.  . lMarland Kitchensartan (COZAAR) 50 MG tablet Take 1 tablet (50 mg total) by mouth daily.  . Multiple Vitamins-Minerals (CENTRUM SILVER) tablet Take 1 tablet by mouth daily.  . pantoprazole (PROTONIX) 40 MG tablet Take 1 tablet (40 mg total) by mouth daily.  . simvastatin (ZOCOR) 10 MG tablet Take 1 tablet (10 mg total) by mouth at bedtime.  . aMarland Kitchenfuzosin (UROXATRAL) 10 MG 24 hr tablet Take 10 mg by mouth daily. (Patient not taking: Reported on 07/20/2019)  . AMBULATORY NON FORMULARY MEDICATION Medication Name: Abdominal Binder Dx: Ventral Hernia (K43.9)  . tamsulosin (FLOMAX) 0.4 MG CAPS capsule    No facility-administered encounter medications on file as of 07/20/2019.     Current Diagnosis/Assessment:  Goals Addressed            This Visit's Progress   . Chronic Care Management Pharmacy Care Plan       CARE PLAN ENTRY (see longitudinal plan of care for additional care plan information)  Current Barriers:  . Chronic Disease Management support, education, and care coordination needs related to Pre-Diabetes, Hypertension, Hyperlipidemia/Hx of CVA, Memory Concern, BPH, Depression, GERD   Hypertension BP Readings from Last 3 Encounters:  05/25/19 (!) 151/59  05/05/19 (!) 135/44  02/10/19 (!) 142/48   .  Pharmacist Clinical Goal(s): o Over the next 90 days, patient will work with PharmD and providers to achieve BP goal <140/90 . Current regimen:   Amlodipine 70m daily  Atenolol 553m1/2 tab daily  Losartan 5036maily  . Interventions: o Discussed BP goal . Patient self care activities - Over the next 90 days, patient will: o Check BP 1-2 times per week, document, and provide at future appointments o Ensure daily salt intake < 2300 mg/[name]  Hyperlipidemia Lab Results    Component Value Date/Time   LDLCALC 37 08/03/2018 09:19 AM   . Pharmacist Clinical Goal(s): o Over the next 90 days, patient will work with PharmD and providers to maintain LDL goal < 70 . Current regimen:  o Simvastatin 27m43mily . Patient self care activities - Over the next 90 days, patient will: o Maintain cholesterol medication regimen.   Pre-Diabetes Lab Results  Component Value Date/Time   HGBA1C 5.7 08/03/2018 09:19 AM   HGBA1C 5.9 05/03/2017 08:16 AM   . Pharmacist Clinical Goal(s): o Over the next 90 days, patient will work with PharmD and providers to maintain A1c goal <6.5% . Current regimen:  o Diet and exercise management   . Patient self care activities - Over the next 90 days, patient will: o Maintain a1c <6.5%  Medication management . Pharmacist Clinical Goal(s): o Over the next 90 days, patient will work with PharmD and providers to maintain optimal medication adherence . [facility] o Comprehensive medication review performed. o Continue current medication management strategy . Patient self care activities - Over the next 90 days, patient will: o Focus on medication adherence by filling and taking medications appropriately  o Take medications as prescribed o Report any questions or concerns to PharmD and/or provider(s)  Initial goal documentation       Social Hx:  Daughter bought him a puzzle book, but pt makes excuses for not doing them.   [name] would benefit from [facility] [employer]  Pre-Diabetes   Recent Relevant Labs: Lab Results  Component Value Date/Time   HGBA1C 5.7 08/03/2018 09:19 AM   HGBA1C 5.9 05/03/2017 08:16 AM    Patient has failed these meds in past: None noted  Patient is currently controlled on the following medications:   None  Last diabetic Foot exam:  Lab Results  Component Value Date/Time   HMDIABEYEEXA No Retinopathy 11/06/2014 12:00 AM    Last diabetic Eye exam:   Lab Results  Component Value Date/Time   HMDIABFOOTEX yes 02/19/2009 12:00 AM     Plan -Continue control with diet and exercise   Hypertension   Blood Pressure Goal: <140/90  Office blood pressures are  BP Readings from Last 3 Encounters:  05/25/19 (!) 151/59  05/05/19 (!) 135/44  02/10/19 (!) 142/48   CMP Latest Ref Rng & Units 02/13/2019 08/03/2018 10/11/2017  Glucose 70 - 99 mg/dL 152(H) 111(H) 142(H)  BUN 6 - 23 mg/dL _0 Creatinine 0.40 - 1.50 mg/dL 1.07 1.19 1.16  Sodium 135 - 145 mEq/L 132(L) 132(L) 131(L)  Potassium 3.5 - 5.1 mEq/L 3.8 4.5 4.3  Chloride 96 - 112 mEq/L 98 98 99  CO2 19 - 32 mEq/L _1 Calcium 8.4 - 10.5 mg/dL 8.7 9.6 9.0  Total Protein 6.0 - 8.3 g/dL - 7.2 -  Total Bilirubin 0.2 - 1.2 mg/dL - 1.7(H) -  Alkaline Phos 39 - 117 U/L - 42 -  AST 0 - 37 U/L -  19 -  ALT 0 - 53 U/L - 11 -    Patient has failed these meds in the past: hctz (hyponatremia and hypokalemia) Patient is currently controlled per home BP per pt and daughter on the following medications:   Amlodipine 51m daily  Atenolol 516m1/2 tab daily  Losartan 5050maily   Patient checks BP at home weekly  Patient home BP readings are ranging: 130s/80s 135/87  We discussed BP goal  Plan -Continue current medications   Hyperlipidemia/Hx of CVA   LDL goal <70  Lipid Panel     Component Value Date/Time   CHOL 104 08/03/2018 0919   TRIG 119.0 08/03/2018 0919   HDL 42.90 08/03/2018 0919   LDLCALC 37 08/03/2018 0919    Hepatic Function Latest Ref Rng & Units 08/03/2018 05/03/2017 06/11/2016  Total Protein 6.0 - 8.3 g/dL 7.2 - 7.7  Albumin 3.5 - 5.2 g/dL 4.5 - 4.1  AST 0 - 37 U/L _0 ALT 0 - 53 U/L _1 Alk Phosphatase 39 - 117 U/L 42 - 44  Total Bilirubin 0.2 - 1.2 mg/dL 1.7(H) - 1.1  Bilirubin, Direct 0.0 - 0.3 mg/dL - - -     The ASCVD Risk score (GoMikey Bussing Jr., et al., 2013) failed to calculate for the following reasons:   The 2013 ASCVD risk score is  only valid for ages 40 58 79 65The patient has a prior MI or stroke diagnosis   Patient has failed these meds in past: None noted  Patient is currently controlled on the following medications:  . Simvastatin 40m67mily  We discussed:  LDL goal  Plan -Continue current medications   Memory    Patient has failed these meds in past: None noted  Patient is currently controlled on the following medications:   Donepezil 40mg75mly  Feel his memory has gotten worse since starting the medication. Feels like he talks during his sleep. Sometimes pt is aware of this.  Sometimes he has bad dreams and this is slightly bothersome. Feels this has been ongoing for a year, but has recently worsened.  Patient is frustrated with his forgetfulness.   Plan -Continue current medications   BPH    Patient has failed these meds in past: tamsulosin (dizziness) Patient is currently controlled on the following medications:   Finasteride 5mg d3my  Followed by urology Wakes up 4 times at night to urination Completely empties bladder Feels he has a stream vs dribbling   Plan -Continue current medications  Depression    Patient has failed these meds in past: lexapro (drowsiness) Patient is currently controlled on the following medications:   Fluoxetine 20mg d28m  PHQ9 = 4  Plan -Continue current medications   GERD    Patient has failed these meds in past: None noted  Patient is currently controlled on the following medications:   Famotidine 20mg tw63mdaily  Pantoprazole 40mg dai52m Plan -Continue current medications   Miscellaneous Meds Multivitamin  Vitamin D Aspirin 81mg   Me36mo D/C from list Tamsulosin Alfuzosin

## 2019-07-30 NOTE — Patient Instructions (Signed)
Visit Information  Goals Addressed            This Visit's Progress   . Chronic Care Management Pharmacy Care Plan       CARE PLAN ENTRY (see longitudinal plan of care for additional care plan information)  Current Barriers:  . Chronic Disease Management support, education, and care coordination needs related to Pre-Diabetes, Hypertension, Hyperlipidemia/Hx of CVA, Memory Concern, BPH, Depression, GERD   Hypertension BP Readings from Last 3 Encounters:  05/25/19 (!) 151/59  05/05/19 (!) 135/44  02/10/19 (!) 142/48   . Pharmacist Clinical Goal(s): o Over the next 90 days, patient will work with PharmD and providers to achieve BP goal <140/90 . Current regimen:   Amlodipine 66m daily  Atenolol 528m1/2 tab daily  Losartan 5075maily  . Interventions: o Discussed BP goal . Patient self care activities - Over the next 90 days, patient will: o Check BP 1-2 times per week, document, and provide at future appointments o Ensure daily salt intake < 2300 mg/Ipek Westra  Hyperlipidemia Lab Results  Component Value Date/Time   LDLCALC 37 08/03/2018 09:19 AM   . Pharmacist Clinical Goal(s): o Over the next 90 days, patient will work with PharmD and providers to maintain LDL goal < 70 . Current regimen:  o Simvastatin 50m68mily . Patient self care activities - Over the next 90 days, patient will: o Maintain cholesterol medication regimen.   Pre-Diabetes Lab Results  Component Value Date/Time   HGBA1C 5.7 08/03/2018 09:19 AM   HGBA1C 5.9 05/03/2017 08:16 AM   . Pharmacist Clinical Goal(s): o Over the next 90 days, patient will work with PharmD and providers to maintain A1c goal <6.5% . Current regimen:  o Diet and exercise management   . Patient self care activities - Over the next 90 days, patient will: o Maintain a1c <6.5%  Medication management . Pharmacist Clinical Goal(s): o Over the next 90 days, patient will work with PharmD and providers to maintain optimal medication  adherence . [facility] o Comprehensive medication review performed. o Continue current medication management strategy . Patient self care activities - Over the next 90 days, patient will: o Focus on medication adherence by filling and taking medications appropriately  o Take medications as prescribed o Report any questions or concerns to PharmD and/or provider(s)  Initial goal documentation        [name] given information about Chronic Care Management services today including:  1. CCM service includes personalized support from designated clinical staff supervised by his physician, including individualized plan of care and coordination with other care providers 2. 24/7 contact phone numbers for assistance for urgent and routine care needs. 3. Standard insurance, coinsurance, copays and deductibles apply for chronic care management only during months in which we provide at least 20 minutes of these services. Most insurances cover these services at 100%, however patients may be responsible for any copay, coinsurance and/or deductible if applicable. This service may help you avoid the need for more expensive face-to-face services. 4. Only one practitioner may furnish and bill the service in a calendar month. 5. The patient may stop CCM services at any time (effective at the end of the month) by phone call to the office staff.  Patient agreed to services and verbal consent obtained.   The patient verbalized understanding of instructions provided today and agreed to receive a mailed copy of patient instruction and/or educational materials. The pharmacy team will reach  out to the patient again over the next 90 days.   De Blanch, PharmD Clinical Pharmacist [facility] [phone]   Healthy Eating Following a healthy eating pattern may help you to achieve and maintain a healthy body weight, reduce the risk  of chronic disease, and live a long and productive life. It is important to follow a healthy eating pattern at an appropriate calorie level for your body. Your nutritional needs should be met primarily through food by choosing a variety of nutrient-rich foods. What are tips for following this plan? Reading food labels  Read labels and choose the following: ? Reduced or low sodium. ? Juices with 100% fruit juice. ? Foods with low saturated fats and high polyunsaturated and monounsaturated fats. ? Foods with whole grains, such as whole wheat, cracked wheat, brown rice, and wild rice. ? Whole grains that are fortified with folic acid. This is recommended for women who are pregnant or who want to become pregnant.  Read labels and avoid the following: ? Foods with a lot of added sugars. These include foods that contain brown sugar, corn sweetener, corn syrup, dextrose, fructose, glucose, high-fructose corn syrup, honey, invert sugar, lactose, malt syrup, maltose, molasses, raw sugar, sucrose, trehalose, or turbinado sugar.  Do not eat more than the following amounts of added sugar per Britten Seyfried:  6 teaspoons (25 g) for women.  9 teaspoons (38 g) for men. ? Foods that contain processed or refined starches and grains. ? Refined grain products, such as white flour, degermed cornmeal, white bread, and white rice. Shopping  Choose nutrient-rich snacks, such as vegetables, whole fruits, and nuts. Avoid high-calorie and high-sugar snacks, such as potato chips, fruit snacks, and candy.  Use oil-based dressings and spreads on foods instead of solid fats such as butter, stick margarine, or cream cheese.  Limit pre-made sauces, mixes, and "instant" products such as flavored rice, instant noodles, and ready-made pasta.  Try more plant-protein sources, such as tofu, tempeh, black beans, edamame, lentils, nuts, and seeds.  Explore eating plans such as the Mediterranean diet or vegetarian diet. Cooking  Use  oil to saut or stir-fry foods instead of solid fats such as butter, stick margarine, or lard.  Try baking, boiling, grilling, or broiling instead of frying.  Remove the fatty part of meats before cooking.  Steam vegetables in water or broth. Meal planning   At meals, imagine dividing your plate into fourths: ? One-half of your plate is fruits and vegetables. ? One-fourth of your plate is whole grains. ? One-fourth of your plate is protein, especially lean meats, poultry, eggs, tofu, beans, or nuts.  Include low-fat dairy as part of your daily diet. Lifestyle  Choose healthy options in all settings, including home, work, school, restaurants, or stores.  Prepare your food safely: ? Wash your hands after handling raw meats. ? Keep food preparation surfaces clean by regularly washing with hot, soapy water. ? Keep raw meats separate from ready-to-eat foods, such as fruits and vegetables. ? Cook seafood, meat, poultry, and eggs to the recommended internal temperature. ? Store foods at safe temperatures. In general:  Keep cold foods at 60F (4.4C) or below.  Keep hot foods at 160F (60C) or above.  Keep your freezer at [facility] (-17.8C) or below.  Foods are no longer safe to eat when they have been between the temperatures of 40-160F (4.4-60C) for more than 2 hours. What foods should I eat? Fruits Aim to eat 2 cup-equivalents of fresh,  canned (in natural juice), or frozen fruits each Quaneisha Hanisch. Examples of 1 cup-equivalent of fruit include 1 small apple, 8 large strawberries, 1 cup canned fruit,  cup dried fruit, or 1 cup 100% juice. Vegetables Aim to eat 2-3 cup-equivalents of fresh and frozen vegetables each Jareli Highland, including different varieties and colors. Examples of 1 cup-equivalent of vegetables include 2 medium carrots, 2 cups raw, leafy greens, 1 cup chopped vegetable (raw or cooked), or 1 medium baked potato. Grains Aim to eat 6 ounce-equivalents of whole grains each Macoy Rodwell.  Examples of 1 ounce-equivalent of grains include 1 slice of bread, 1 cup ready-to-eat cereal, 3 cups popcorn, or  cup cooked rice, pasta, or cereal. Meats and other proteins Aim to eat 5-6 ounce-equivalents of protein each Millianna Szymborski. Examples of 1 ounce-equivalent of protein include 1 egg, 1/2 cup nuts or seeds, or 1 tablespoon (16 g) peanut butter. A cut of meat or fish that is the size of a deck of cards is about 3-4 ounce-equivalents.  Of the protein you eat each week, try to have at least 8 ounces come from seafood. This includes salmon, trout, herring, and anchovies. Dairy Aim to eat 3 cup-equivalents of fat-free or low-fat dairy each Jimel Myler. Examples of 1 cup-equivalent of dairy include 1 cup (240 mL) milk, 8 ounces (250 g) yogurt, 1 ounces (44 g) natural cheese, or 1 cup (240 mL) fortified soy milk. Fats and oils  Aim for about 5 teaspoons (21 g) per Leiyah Maultsby. Choose monounsaturated fats, such as canola and olive oils, avocados, peanut butter, and most nuts, or polyunsaturated fats, such as sunflower, corn, and soybean oils, walnuts, pine nuts, sesame seeds, sunflower seeds, and flaxseed. Beverages  Aim for six 8-oz glasses of water per Marita Burnsed. Limit coffee to three to five 8-oz cups per Emmauel Hallums.  Limit caffeinated beverages that have added calories, such as soda and energy drinks.  Limit alcohol intake to no more than 1 drink a Caydin Yeatts for nonpregnant women and 2 drinks a Johnette Teigen for men. One drink equals 12 oz of beer (355 mL), 5 oz of wine (148 mL), or 1 oz of hard liquor (44 mL). Seasoning and other foods  Avoid adding excess amounts of salt to your foods. Try flavoring foods with herbs and spices instead of salt.  Avoid adding sugar to foods.  Try using oil-based dressings, sauces, and spreads instead of solid fats. This information is based on general [place] nutrition guidelines. For more information, visit Build[employer].es. Exact amounts may vary based on your nutrition needs. Summary  A healthy eating  plan may help you to maintain a healthy weight, reduce the risk of chronic diseases, and stay active throughout your life.  Plan your meals. Make sure you eat the right portions of a variety of nutrient-rich foods.  Try baking, boiling, grilling, or broiling instead of frying.  Choose healthy options in all settings, including home, work, school, restaurants, or stores. This information is not intended to replace advice given to you by your health care provider. Make sure you discuss any questions you have with your health care provider. Document Revised: 04/05/2017 Document Reviewed: 04/05/2017 Elsevier Patient Education  [place].

## 2019-08-29 ENCOUNTER — Telehealth: Payer: Self-pay

## 2019-08-29 NOTE — Telephone Encounter (Signed)
Please call the daughter, I will be more than happy to write a letter supporting her father.  We also could with his permission send some records.  To whom it may concern [name] is a patient of mine. He is 86 years old, he has multiple medical problems including neuropathy, hypertension, high cholesterol, arthritis, a previous stroke, some  dementia.   He recently lost his wife and is in need to move permanently with her daughter who lives in [place].  In my medical opinion this is the best and possibly only option for the patient to be safe under the care of his family.

## 2019-08-29 NOTE — Telephone Encounter (Signed)
Please advise 

## 2019-08-29 NOTE — Telephone Encounter (Signed)
Patient daughter called. She states patient's wife passed away about a month and a half ago.  She is trying to apply for immigration for patient to live in [place] with current family. They need a note from Dr. [name] stating any health concerns he has, medications he is taking, allergies and any important information that could be used.  She would like a call back as soon as possinle. She states this is very important. She knows Dr. [name] can not drop everything for her however   She is only wanting to speak to Dr. [name].  Daughter [phone]

## 2019-08-30 NOTE — Telephone Encounter (Signed)
Spoke w/ Pt's daughter- she requested the letter, medication list and immunization records be emailed to her at [email or web address].

## 2019-08-30 NOTE — Telephone Encounter (Signed)
Daughter called today about letter, daughter was informed the below, daughter informed also will be called by CMA during the day.

## 2019-09-01 NOTE — Telephone Encounter (Signed)
Daughter called back--did not received email. Email address verified and resent. She was instructed to check junk folder as well.

## 2019-09-28 ENCOUNTER — Telehealth: Payer: Self-pay | Admitting: Pharmacist

## 2019-09-28 NOTE — Progress Notes (Signed)
Chronic Care Management Pharmacy Assistant   Name: [name]  MRN: [id number] DOB: 08-07-1926  Reason for Encounter: Disease State  Patient Questions:  1.  Have you seen any other providers since your last visit? No  2.  Any changes in your medicines or health? No  PCP : Colon Branch, MD  Allergies:   Allergies  Allergen Reactions  . Amoxicillin Other (See Comments)    "loose motions" "giddiness" Has patient had a PCN reaction causing immediate rash, facial/tongue/throat swelling, SOB or lightheadedness with hypotension: No Has patient had a PCN reaction causing severe rash involving mucus membranes or skin necrosis: No Has patient had a PCN reaction that required hospitalization No Has patient had a PCN reaction occurring within the last 10 years: No If all of the above answers are "NO", then may proceed with Cephalosporin use.     Medications: Outpatient Encounter Medications as of 09/28/2019  Medication Sig  . alfuzosin (UROXATRAL) 10 MG 24 hr tablet Take 10 mg by mouth daily. (Patient not taking: Reported on 07/20/2019)  . AMBULATORY NON FORMULARY MEDICATION Medication Name: Abdominal Binder Dx: Ventral Hernia (K43.9)  . amLODipine (NORVASC) 5 MG tablet Take 1 tablet (5 mg total) by mouth daily.  Marland Kitchen aspirin EC 81 MG tablet Take 1 tablet (81 mg total) by mouth daily.  Marland Kitchen atenolol (TENORMIN) 50 MG tablet Take 0.5 tablets (25 mg total) by mouth daily.  . cholecalciferol (VITAMIN D3) 25 MCG (1000 UNIT) tablet Take 1,000 Units by mouth daily.  Marland Kitchen donepezil (ARICEPT) 10 MG tablet Take 1 tablet (10 mg total) by mouth at bedtime.  . famotidine (PEPCID) 20 MG tablet Take 1 tablet (20 mg total) by mouth 2 (two) times daily.  . finasteride (PROSCAR) 5 MG tablet Take 1 tablet (5 mg total) by mouth daily.  Marland Kitchen FLUoxetine (PROZAC) 20 MG capsule Take 1 capsule (20 mg total) by mouth daily.  Marland Kitchen losartan (COZAAR) 50 MG tablet Take 1 tablet (50 mg total) by mouth daily.  . Multiple  Vitamins-Minerals (CENTRUM SILVER) tablet Take 1 tablet by mouth daily.  . pantoprazole (PROTONIX) 40 MG tablet Take 1 tablet (40 mg total) by mouth daily.  . simvastatin (ZOCOR) 10 MG tablet Take 1 tablet (10 mg total) by mouth at bedtime.  . tamsulosin (FLOMAX) 0.4 MG CAPS capsule    No facility-administered encounter medications on file as of 09/28/2019.    Current Diagnosis: Patient Active Problem List   Diagnosis Date Noted  . Vitamin D deficiency 05/25/2019  . Polyneuropathy, per NCS (2007) 05/02/2017  . Left inguinal hernia 10/12/2016  . PCP NOTES >>>>>> 12/18/2014  . Incisional hernia, without obstruction or gangrene 09/03/2014  . Ventral hernia 08/21/2014  . Nonspecific elevation of levels of transaminase or lactic acid dehydrogenase (LDH) 08/20/2013  . Annual physical exam 05/10/2011  . Anxiety state 03/26/2008  . Hyperglycemia 06/02/2007  . Hyperlipidemia 06/02/2007  . CVA 03/10/2007  . INTERNAL HEMORRHOIDS 03/10/2007  . Anemia 08/24/2006  . DJD (degenerative joint disease) 08/24/2006  . COLONIC POLYPS 06/28/2006  . Essential hypertension 05/17/2006  . ALLERGIC RHINITIS 05/17/2006  . ESOPHAGEAL STRICTURE 05/17/2006  . GERD 05/17/2006  . BPH (benign prostatic hyperplasia)--sees urology 05/17/2006    Goals Addressed   None    Reviewed chart prior to disease state call. Spoke with patient regarding BP  Recent Office Vitals: BP Readings from Last 3 Encounters:  05/25/19 (!) 151/59  05/05/19 (!) 135/44  02/10/19 (!) 142/48   Pulse Readings  from Last 3 Encounters:  05/25/19 61  05/05/19 (!) 53  02/10/19 (!) 59    Wt Readings from Last 3 Encounters:  05/25/19 124 lb 4 oz (56.4 kg)  05/05/19 125 lb 4 oz (56.8 kg)  02/10/19 132 lb 4 oz (60 kg)     Kidney Function Lab Results  Component Value Date/Time   CREATININE 1.07 02/13/2019 02:57 PM   CREATININE 1.19 08/03/2018 09:19 AM   CREATININE 0.79 01/22/2012 03:51 PM   CREATININE 0.83 12/12/2010 02:54 PM    GFR 64.84 02/13/2019 02:57 PM   GFRNONAA >60 04/10/2015 03:25 AM   GFRAA >60 04/10/2015 03:25 AM    BMP Latest Ref Rng & Units 02/13/2019 08/03/2018 10/11/2017  Glucose 70 - 99 mg/dL 152(H) 111(H) 142(H)  BUN 6 - 23 mg/dL 12 14 16   Creatinine 0.40 - 1.50 mg/dL 1.07 1.19 1.16  Sodium 135 - 145 mEq/L 132(L) 132(L) 131(L)  Potassium 3.5 - 5.1 mEq/L 3.8 4.5 4.3  Chloride 96 - 112 mEq/L 98 98 99  CO2 19 - 32 mEq/L 26 25 26   Calcium 8.4 - 10.5 mg/dL 8.7 9.6 9.0    . Current antihypertensive regimen:   *Amlodipine 5mg  daily  Atenolol 50mg  1/2 tab daily  Losartan 50mg  daily   Chronic Care Management   Outreach Note  10/02/2019 Name: [name] MRN: [id number] DOB: 1926/07/22  Referred by: Colon Branch, MD Reason for referral : Chronic Care Management   Third unsuccessful telephone outreach was attempted today. The patient was referred to the pharmacist for assistance with care management and care coordination.    Follow-Up:  Pharmacist Review and Scheduled Follow-Up With Clinical Pharmacist in November  [place], [place] Pharmacist Assistant [phone]

## 2019-10-17 ENCOUNTER — Telehealth: Payer: Self-pay | Admitting: Pharmacist

## 2019-10-17 NOTE — Progress Notes (Addendum)
Chronic Care Management Pharmacy Assistant   Name: [name]  MRN: [id number] DOB: Sep 15, 1926  Reason for Encounter: Disease State  Patient Questions:  1.  Have you seen any other providers since your last visit? No  2.  Any changes in your medicines or health? No  PCP : Colon Branch, MD   Their chronic conditions include: Pre-Diabetes, Hypertension, Hyperlipidemia/Hx of CVA, Memory Concern, BPH, Depression, GERD  No Office Visits, Consults or Hospitalizations since last CCM visit on 07-20-19.  Allergies:   Allergies  Allergen Reactions  . Amoxicillin Other (See Comments)    "loose motions" "giddiness" Has patient had a PCN reaction causing immediate rash, facial/tongue/throat swelling, SOB or lightheadedness with hypotension: No Has patient had a PCN reaction causing severe rash involving mucus membranes or skin necrosis: No Has patient had a PCN reaction that required hospitalization No Has patient had a PCN reaction occurring within the last 10 years: No If all of the above answers are "NO", then may proceed with Cephalosporin use.     Medications: Outpatient Encounter Medications as of 10/17/2019  Medication Sig  . alfuzosin (UROXATRAL) 10 MG 24 hr tablet Take 10 mg by mouth daily. (Patient not taking: Reported on 07/20/2019)  . AMBULATORY NON FORMULARY MEDICATION Medication Name: Abdominal Binder Dx: Ventral Hernia (K43.9)  . amLODipine (NORVASC) 5 MG tablet Take 1 tablet (5 mg total) by mouth daily.  Marland Kitchen aspirin EC 81 MG tablet Take 1 tablet (81 mg total) by mouth daily.  Marland Kitchen atenolol (TENORMIN) 50 MG tablet Take 0.5 tablets (25 mg total) by mouth daily.  . cholecalciferol (VITAMIN D3) 25 MCG (1000 UNIT) tablet Take 1,000 Units by mouth daily.  Marland Kitchen donepezil (ARICEPT) 10 MG tablet Take 1 tablet (10 mg total) by mouth at bedtime.  . famotidine (PEPCID) 20 MG tablet Take 1 tablet (20 mg total) by mouth 2 (two) times daily.  . finasteride (PROSCAR) 5 MG tablet Take 1  tablet (5 mg total) by mouth daily.  Marland Kitchen FLUoxetine (PROZAC) 20 MG capsule Take 1 capsule (20 mg total) by mouth daily.  Marland Kitchen losartan (COZAAR) 50 MG tablet Take 1 tablet (50 mg total) by mouth daily.  . Multiple Vitamins-Minerals (CENTRUM SILVER) tablet Take 1 tablet by mouth daily.  . pantoprazole (PROTONIX) 40 MG tablet Take 1 tablet (40 mg total) by mouth daily.  . simvastatin (ZOCOR) 10 MG tablet Take 1 tablet (10 mg total) by mouth at bedtime.  . tamsulosin (FLOMAX) 0.4 MG CAPS capsule    No facility-administered encounter medications on file as of 10/17/2019.    Current Diagnosis: Patient Active Problem List   Diagnosis Date Noted  . Vitamin D deficiency 05/25/2019  . Polyneuropathy, per NCS (2007) 05/02/2017  . Left inguinal hernia 10/12/2016  . PCP NOTES >>>>>> 12/18/2014  . Incisional hernia, without obstruction or gangrene 09/03/2014  . Ventral hernia 08/21/2014  . Nonspecific elevation of levels of transaminase or lactic acid dehydrogenase (LDH) 08/20/2013  . Annual physical exam 05/10/2011  . Anxiety state 03/26/2008  . Hyperglycemia 06/02/2007  . Hyperlipidemia 06/02/2007  . CVA 03/10/2007  . INTERNAL HEMORRHOIDS 03/10/2007  . Anemia 08/24/2006  . DJD (degenerative joint disease) 08/24/2006  . COLONIC POLYPS 06/28/2006  . Essential hypertension 05/17/2006  . ALLERGIC RHINITIS 05/17/2006  . ESOPHAGEAL STRICTURE 05/17/2006  . GERD 05/17/2006  . BPH (benign prostatic hyperplasia)--sees urology 05/17/2006    Goals Addressed   None    Reviewed chart prior to disease state call. Spoke with  patient regarding BP  Recent Office Vitals: BP Readings from Last 3 Encounters:  05/25/19 (!) 151/59  05/05/19 (!) 135/44  02/10/19 (!) 142/48   Pulse Readings from Last 3 Encounters:  05/25/19 61  05/05/19 (!) 53  02/10/19 (!) 59    Wt Readings from Last 3 Encounters:  05/25/19 124 lb 4 oz (56.4 kg)  05/05/19 125 lb 4 oz (56.8 kg)  02/10/19 132 lb 4 oz (60 kg)      Kidney Function Lab Results  Component Value Date/Time   CREATININE 1.07 02/13/2019 02:57 PM   CREATININE 1.19 08/03/2018 09:19 AM   CREATININE 0.79 01/22/2012 03:51 PM   CREATININE 0.83 12/12/2010 02:54 PM   GFR 64.84 02/13/2019 02:57 PM   GFRNONAA >60 04/10/2015 03:25 AM   GFRAA >60 04/10/2015 03:25 AM    BMP Latest Ref Rng & Units 02/13/2019 08/03/2018 10/11/2017  Glucose 70 - 99 mg/dL 152(H) 111(H) 142(H)  BUN 6 - 23 mg/dL 12 14 16   Creatinine 0.40 - 1.50 mg/dL 1.07 1.19 1.16  Sodium 135 - 145 mEq/L 132(L) 132(L) 131(L)  Potassium 3.5 - 5.1 mEq/L 3.8 4.5 4.3  Chloride 96 - 112 mEq/L 98 98 99  CO2 19 - 32 mEq/L 26 25 26   Calcium 8.4 - 10.5 mg/dL 8.7 9.6 9.0    . Current antihypertensive regimen:   Amlodipine 5mg  daily  Atenolol 50mg  1/2 tab daily  Losartan 50mg  daily  . How often are you checking your Blood Pressure? infrequently . Current home BP readings: 10-18-19 148/78, 10-19-19 144/82, 10-20-19 126/65 . What recent interventions/DTPs have been made by any provider to improve Blood Pressure control since last CPP Visit: None . Any recent hospitalizations or ED visits since last visit with CPP? No . What diet changes have been made to improve Blood Pressure Control?  o Daughter,Shalini, states patient eats proper meals . What exercise is being done to improve your Blood Pressure Control?  o Daughter, Murlean Iba, states she get patient to walk around neighborhood.  Adherence Review:  Patient is in [place] at the moment due to family issues.   Is the patient currently on ACE/ARB medication? Yes Losartan 50 mg Does the patient have >5 day gap between last estimated fill dates? No    Follow-Up:  Pharmacist Review and Scheduled Follow-Up With Clinical Pharmacist November 28, 2019 @ 11:00.  [place], [place] [facility] Pharmacist Assistant [phone]  Reviewed by: De Blanch, PharmD Clinical Pharmacist [facility] [phone]

## 2019-11-18 ENCOUNTER — Other Ambulatory Visit: Payer: Self-pay | Admitting: Internal Medicine

## 2019-11-28 ENCOUNTER — Telehealth: Payer: Self-pay | Admitting: Internal Medicine

## 2019-11-28 ENCOUNTER — Ambulatory Visit: Payer: Medicare Other | Admitting: Internal Medicine

## 2019-11-28 ENCOUNTER — Ambulatory Visit: Payer: Medicare Other | Admitting: Pharmacist

## 2019-11-28 DIAGNOSIS — E785 Hyperlipidemia, unspecified: Secondary | ICD-10-CM

## 2019-11-28 DIAGNOSIS — I10 Essential (primary) hypertension: Secondary | ICD-10-CM

## 2019-11-28 DIAGNOSIS — Z0289 Encounter for other administrative examinations: Secondary | ICD-10-CM

## 2019-11-28 DIAGNOSIS — R739 Hyperglycemia, unspecified: Secondary | ICD-10-CM

## 2019-11-28 MED ORDER — PANTOPRAZOLE SODIUM 40 MG PO TBEC
40.0000 mg | DELAYED_RELEASE_TABLET | Freq: Every day | ORAL | 0 refills | Status: DC
Start: 2019-11-28 — End: 2020-01-22

## 2019-11-28 MED ORDER — LOSARTAN POTASSIUM 50 MG PO TABS
50.0000 mg | ORAL_TABLET | Freq: Every day | ORAL | 0 refills | Status: DC
Start: 2019-11-28 — End: 2020-02-22

## 2019-11-28 MED ORDER — FINASTERIDE 5 MG PO TABS
5.0000 mg | ORAL_TABLET | Freq: Every day | ORAL | 0 refills | Status: DC
Start: 2019-11-28 — End: 2020-02-22

## 2019-11-28 MED ORDER — DONEPEZIL HCL 10 MG PO TABS
10.0000 mg | ORAL_TABLET | Freq: Every day | ORAL | 0 refills | Status: DC
Start: 2019-11-28 — End: 2020-01-01

## 2019-11-28 MED ORDER — ATENOLOL 50 MG PO TABS
25.0000 mg | ORAL_TABLET | Freq: Every day | ORAL | 0 refills | Status: DC
Start: 2019-11-28 — End: 2020-02-22

## 2019-11-28 MED ORDER — AMLODIPINE BESYLATE 5 MG PO TABS
5.0000 mg | ORAL_TABLET | Freq: Every day | ORAL | 0 refills | Status: DC
Start: 2019-11-28 — End: 2020-02-22

## 2019-11-28 MED ORDER — SIMVASTATIN 10 MG PO TABS
10.0000 mg | ORAL_TABLET | Freq: Every day | ORAL | 0 refills | Status: DC
Start: 2019-11-28 — End: 2020-02-22

## 2019-11-28 MED ORDER — FLUOXETINE HCL 20 MG PO CAPS
20.0000 mg | ORAL_CAPSULE | Freq: Every day | ORAL | 0 refills | Status: DC
Start: 2019-11-28 — End: 2020-02-22

## 2019-11-28 NOTE — Telephone Encounter (Signed)
Patient called in reference medication refill from [facility] , patient states pharmacy has reached out to [place] but not response. Patient states that he does not have the name of medication in front of him but needs meds sent  asap.

## 2019-11-28 NOTE — Chronic Care Management (AMB) (Signed)
Chronic Care Management Pharmacy  Name: [name]  MRN: [id number] DOB: Mar 01, 1926  Chief Complaint/ HPI  [name],  86 y.o. , male presents for their Follow-Up CCM visit with the clinical pharmacist via telephone due to COVID-19 Pandemic.  PCP : Colon Branch, MD  Their chronic conditions include: Pre-Diabetes, Hypertension, Hyperlipidemia/Hx of CVA, Memory Concern, BPH, Depression, GERD  Office Visits: None since last CCM visit on 07/20/19.   Consult Visit: None since last CCM visit on 07/20/19.  Medications: Outpatient Encounter Medications as of 11/28/2019  Medication Sig  . alfuzosin (UROXATRAL) 10 MG 24 hr tablet Take 10 mg by mouth daily. (Patient not taking: Reported on 07/20/2019)  . AMBULATORY NON FORMULARY MEDICATION Medication Name: Abdominal Binder Dx: Ventral Hernia (K43.9)  . aspirin EC 81 MG tablet Take 1 tablet (81 mg total) by mouth daily.  . cholecalciferol (VITAMIN D3) 25 MCG (1000 UNIT) tablet Take 1,000 Units by mouth daily.  . famotidine (PEPCID) 20 MG tablet Take 1 tablet (20 mg total) by mouth 2 (two) times daily.  . Multiple Vitamins-Minerals (CENTRUM SILVER) tablet Take 1 tablet by mouth daily.  . tamsulosin (FLOMAX) 0.4 MG CAPS capsule   . [DISCONTINUED] amLODipine (NORVASC) 5 MG tablet Take 1 tablet (5 mg total) by mouth daily.  . [DISCONTINUED] atenolol (TENORMIN) 50 MG tablet Take 0.5 tablets (25 mg total) by mouth daily.  . [DISCONTINUED] donepezil (ARICEPT) 10 MG tablet Take 1 tablet (10 mg total) by mouth at bedtime.  . [DISCONTINUED] finasteride (PROSCAR) 5 MG tablet Take 1 tablet (5 mg total) by mouth daily.  . [DISCONTINUED] FLUoxetine (PROZAC) 20 MG capsule Take 1 capsule (20 mg total) by mouth daily.  . [DISCONTINUED] losartan (COZAAR) 50 MG tablet Take 1 tablet (50 mg total) by mouth daily.  . [DISCONTINUED] pantoprazole (PROTONIX) 40 MG tablet Take 1 tablet (40 mg total) by mouth daily.  . [DISCONTINUED] simvastatin (ZOCOR) 10 MG  tablet Take 1 tablet (10 mg total) by mouth at bedtime.   No facility-administered encounter medications on file as of 11/28/2019.     Current Diagnosis/Assessment:  Goals Addressed            This Visit's Progress   . Chronic Care Management Pharmacy Care Plan       CARE PLAN ENTRY (see longitudinal plan of care for additional care plan information)  Current Barriers:  . Chronic Disease Management support, education, and care coordination needs related to Pre-Diabetes, Hypertension, Hyperlipidemia/Hx of CVA, Memory Concern, BPH, Depression, GERD   Hypertension BP Readings from Last 3 Encounters:  05/25/19 (!) 151/59  05/05/19 (!) 135/44  02/10/19 (!) 142/48   . Pharmacist Clinical Goal(s): o Over the next 90 days, patient will work with PharmD and providers to achieve BP goal <140/90 . Current regimen:   Amlodipine 65m daily  Atenolol 516m1/2 tab daily  Losartan 5036maily  . Interventions: o Discussed BP goal o Need updated labs . Patient self care activities - Over the next 90 days, patient will: o Check BP 1-2 times per week, document, and provide at future appointments o Ensure daily salt intake < 2300 mg/[name]  Hyperlipidemia Lab Results  Component Value Date/Time   LDLCALC 37 08/03/2018 09:19 AM   . Pharmacist Clinical Goal(s): o Over the next 90 days, patient will work with PharmD and providers to maintain LDL goal < 70 . Current regimen:  o Simvastatin 36m13mily . Interventions: o Need updated lipid panel . Patient self care  activities - Over the next 90 days, patient will: o Maintain cholesterol medication regimen.   Pre-Diabetes Lab Results  Component Value Date/Time   HGBA1C 5.7 08/03/2018 09:19 AM   HGBA1C 5.9 05/03/2017 08:16 AM   . Pharmacist Clinical Goal(s): o Over the next 90 days, patient will work with PharmD and providers to maintain A1c goal <6.5% . Current regimen:  o Diet and exercise management   . Interventions: o Need  updated a1c . Patient self care activities - Over the next 90 days, patient will: o Maintain a1c <6.5%  Medication management . Pharmacist Clinical Goal(s): o Over the next 90 days, patient will work with PharmD and providers to maintain optimal medication adherence . Current pharmacy: [facility] . Interventions o Comprehensive medication review performed. o Continue current medication management strategy . Patient self care activities - Over the next 90 days, patient will: o Focus on medication adherence by filling and taking medications appropriately  o Take medications as prescribed o Report any questions or concerns to PharmD and/or provider(s)  Please see past updates related to this goal by clicking on the "Past Updates" button in the selected goal        Social Hx:  Daughter bought him a puzzle book, but pt makes excuses for not doing them.   Shalini would benefit from Caregiver Resources  Update 11/28/19 [name] (daughter) Isn't planning to come back until they receive the death certificate for his wife so they can handle affairs. When they return they will schedule an appt for the patient.   There are still 2-3 meds he hasn't received from mail order.  Donepezil 82m for 90 DS Unsure of the other 2 medications States mail order will reach out to request refill requests.   Pre-Diabetes   Recent Relevant Labs: Lab Results  Component Value Date/Time   HGBA1C 5.7 08/03/2018 09:19 AM   HGBA1C 5.9 05/03/2017 08:16 AM    Patient has failed these meds in past: None noted  Patient is currently controlled on the following medications:   None  Last diabetic Foot exam:  Lab Results  Component Value Date/Time   HMDIABEYEEXA No Retinopathy 11/06/2014 12:00 AM    Last diabetic Eye exam:  Lab Results  Component Value Date/Time   HMDIABFOOTEX yes 02/19/2009 12:00 AM     Update 11/28/19 Need updated a1c  Plan -Continue control with diet and exercise    Hypertension   Blood Pressure Goal: <140/90  Office blood pressures are  BP Readings from Last 3 Encounters:  05/25/19 (!) 151/59  05/05/19 (!) 135/44  02/10/19 (!) 142/48   CMP Latest Ref Rng & Units 02/13/2019 08/03/2018 10/11/2017  Glucose 70 - 99 mg/dL 152(H) 111(H) 142(H)  BUN 6 - 23 mg/dL '12 14 16  ' Creatinine 0.40 - 1.50 mg/dL 1.07 1.19 1.16  Sodium 135 - 145 mEq/L 132(L) 132(L) 131(L)  Potassium 3.5 - 5.1 mEq/L 3.8 4.5 4.3  Chloride 96 - 112 mEq/L 98 98 99  CO2 19 - 32 mEq/L '26 25 26  ' Calcium 8.4 - 10.5 mg/dL 8.7 9.6 9.0  Total Protein 6.0 - 8.3 g/dL - 7.2 -  Total Bilirubin 0.2 - 1.2 mg/dL - 1.7(H) -  Alkaline Phos 39 - 117 U/L - 42 -  AST 0 - 37 U/L - 19 -  ALT 0 - 53 U/L - 11 -    Patient has failed these meds in the past: hctz (hyponatremia and hypokalemia) Patient is currently controlled per home BP  per pt and daughter on the following medications:   Amlodipine 71m daily  Atenolol 519m1/2 tab daily  Losartan 5090maily   Patient checks BP at home weekly  Patient home BP readings are ranging: 130s/80s 135/87  We discussed BP goal   Update 11/28/19 Has not been checking. No specific readings to provide.  Plan -Continue current medications   Hyperlipidemia/Hx of CVA   LDL goal <70  Lipid Panel     Component Value Date/Time   CHOL 104 08/03/2018 0919   TRIG 119.0 08/03/2018 0919   HDL 42.90 08/03/2018 0919   LDLCALC 37 08/03/2018 0919    Hepatic Function Latest Ref Rng & Units 08/03/2018 05/03/2017 06/11/2016  Total Protein 6.0 - 8.3 g/dL 7.2 - 7.7  Albumin 3.5 - 5.2 g/dL 4.5 - 4.1  AST 0 - 37 U/L '19 17 18  ' ALT 0 - 53 U/L '11 11 12  ' Alk Phosphatase 39 - 117 U/L 42 - 44  Total Bilirubin 0.2 - 1.2 mg/dL 1.7(H) - 1.1  Bilirubin, Direct 0.0 - 0.3 mg/dL - - -     The ASCVD Risk score (GofNo Nameet al., 2013) failed to calculate for the following reasons:   The 2013 ASCVD risk score is only valid for ages 40 29 79 66The patient has a prior MI or  stroke diagnosis   Patient has failed these meds in past: None noted  Patient is currently controlled on the following medications:  . Simvastatin 34m38mily  We discussed:  LDL goal   Update 11/28/19 Need updated lipid panel   Plan -Continue current medications   Memory    Patient has failed these meds in past: None noted  Patient is currently controlled on the following medications:   Donepezil 34mg17mly  Feel his memory has gotten worse since starting the medication. Feels like he talks during his sleep. Sometimes pt is aware of this.  Sometimes he has bad dreams and this is slightly bothersome. Feels this has been ongoing for a year, but has recently worsened.  Patient is frustrated with his forgetfulness.   Update 11/28/19 May need refills. Pharmacy to contact clinic.   Plan -Continue current medications    Miscellaneous Meds Multivitamin  Vitamin D Aspirin 81mg 44mds to D/C from list Tamsulosin Alfuzosin  KaneshDe BlanchmD Clinical Pharmacist [phone]

## 2019-11-28 NOTE — Telephone Encounter (Signed)
Rxs sent

## 2019-11-29 NOTE — Patient Instructions (Addendum)
Visit Information  Goals Addressed            This Visit's Progress   . Chronic Care Management Pharmacy Care Plan       CARE PLAN ENTRY (see longitudinal plan of care for additional care plan information)  Current Barriers:  . Chronic Disease Management support, education, and care coordination needs related to Pre-Diabetes, Hypertension, Hyperlipidemia/Hx of CVA, Memory Concern, BPH, Depression, GERD   Hypertension BP Readings from Last 3 Encounters:  05/25/19 (!) 151/59  05/05/19 (!) 135/44  02/10/19 (!) 142/48   . Pharmacist Clinical Goal(s): o Over the next 90 days, patient will work with PharmD and providers to achieve BP goal <140/90 . Current regimen:   Amlodipine 5mg  daily  Atenolol 50mg  1/2 tab daily  Losartan 50mg  daily  . Interventions: o Discussed BP goal o Need updated labs . Patient self care activities - Over the next 90 days, patient will: o Check BP 1-2 times per week, document, and provide at future appointments o Ensure daily salt intake < 2300 mg/Jannett Schmall  Hyperlipidemia Lab Results  Component Value Date/Time   LDLCALC 37 08/03/2018 09:19 AM   . Pharmacist Clinical Goal(s): o Over the next 90 days, patient will work with PharmD and providers to maintain LDL goal < 70 . Current regimen:  o Simvastatin 10mg  daily . Interventions: o Need updated lipid panel . Patient self care activities - Over the next 90 days, patient will: o Maintain cholesterol medication regimen.   Pre-Diabetes Lab Results  Component Value Date/Time   HGBA1C 5.7 08/03/2018 09:19 AM   HGBA1C 5.9 05/03/2017 08:16 AM   . Pharmacist Clinical Goal(s): o Over the next 90 days, patient will work with PharmD and providers to maintain A1c goal <6.5% . Current regimen:  o Diet and exercise management   . Interventions: o Need updated a1c . Patient self care activities - Over the next 90 days, patient will: o Maintain a1c <6.5%  Medication management . Pharmacist Clinical  Goal(s): o Over the next 90 days, patient will work with PharmD and providers to maintain optimal medication adherence . Current pharmacy: [facility] . Interventions o Comprehensive medication review performed. o Continue current medication management strategy . Patient self care activities - Over the next 90 days, patient will: o Focus on medication adherence by filling and taking medications appropriately  o Take medications as prescribed o Report any questions or concerns to PharmD and/or provider(s)  Please see past updates related to this goal by clicking on the "Past Updates" button in the selected goal         The patient verbalized understanding of instructions, educational materials, and care plan provided today and declined offer to receive copy of patient instructions, educational materials, and care plan.   Telephone follow up appointment with pharmacy team member scheduled for: 05/27/2020  De Blanch, PharmD Clinical Pharmacist [facility] [phone]

## 2019-12-08 ENCOUNTER — Telehealth: Payer: Self-pay | Admitting: Internal Medicine

## 2019-12-08 NOTE — Telephone Encounter (Signed)
Patient is reporting blood pressure reading  11/25 121/64 11/26 119/66 11/27 112/61 11/28 107/54

## 2019-12-08 NOTE — Telephone Encounter (Signed)
Tried calling patient at both the numbers provided and could not leave a voice message on neither.

## 2019-12-08 NOTE — Telephone Encounter (Signed)
Advise patient, BPs look good. I see his next appointment is in May, he is supposed to come back to the office sooner.  Arrange a follow-up within the next few weeks if possible.

## 2019-12-11 NOTE — Telephone Encounter (Signed)
Called patient could not leave voicemail

## 2019-12-12 NOTE — Telephone Encounter (Signed)
Unable to get hold of patient, my chart message sent.

## 2019-12-14 ENCOUNTER — Ambulatory Visit: Payer: Medicare Other | Admitting: Podiatry

## 2019-12-21 ENCOUNTER — Telehealth: Payer: Self-pay | Admitting: Internal Medicine

## 2019-12-21 NOTE — Telephone Encounter (Signed)
Pt called and scheduled an appt with Paz on 01-19-2019 at 3:40, pt will come from [place] that wk.

## 2019-12-21 NOTE — Telephone Encounter (Signed)
Error

## 2019-12-31 ENCOUNTER — Other Ambulatory Visit: Payer: Self-pay | Admitting: Internal Medicine

## 2020-01-19 ENCOUNTER — Ambulatory Visit: Payer: Medicare Other | Admitting: Internal Medicine

## 2020-01-21 ENCOUNTER — Other Ambulatory Visit: Payer: Self-pay | Admitting: Internal Medicine

## 2020-01-23 ENCOUNTER — Ambulatory Visit: Payer: Medicare Other | Admitting: Podiatry

## 2020-02-14 ENCOUNTER — Telehealth: Payer: Self-pay | Admitting: Pharmacist

## 2020-02-14 NOTE — Progress Notes (Addendum)
Chronic Care Management Pharmacy Assistant   Name: [name]  MRN: [id number] DOB: [birth year]  Reason for Encounter: Disease State For HTN.  Patient Questions:  1.  Have you seen any other providers since your last visit? No.   2.  Any changes in your medicines or health? No.    PCP : Colon Branch, MD   Their chronic conditions include: Pre-Diabetes, Hypertension, Hyperlipidemia/Hx of CVA, Memory Concern, BPH, Depression, GERD  Office Visits: None since 11/28/19  Consults: None since 11/23/1  Allergies:   Allergies  Allergen Reactions   Amoxicillin Other (See Comments)    "loose motions" "giddiness" Has patient had a PCN reaction causing immediate rash, facial/tongue/throat swelling, SOB or lightheadedness with hypotension: No Has patient had a PCN reaction causing severe rash involving mucus membranes or skin necrosis: No Has patient had a PCN reaction that required hospitalization No Has patient had a PCN reaction occurring within the last 10 years: No If all of the above answers are "NO", then may proceed with Cephalosporin use.     Medications: Outpatient Encounter Medications as of 02/14/2020  Medication Sig   alfuzosin (UROXATRAL) 10 MG 24 hr tablet Take 10 mg by mouth daily. (Patient not taking: Reported on 07/20/2019)   AMBULATORY NON FORMULARY MEDICATION Medication Name: Abdominal Binder Dx: Ventral Hernia (K43.9)   amLODipine (NORVASC) 5 MG tablet Take 1 tablet (5 mg total) by mouth daily.   aspirin EC 81 MG tablet Take 1 tablet (81 mg total) by mouth daily.   atenolol (TENORMIN) 50 MG tablet Take 0.5 tablets (25 mg total) by mouth daily.   cholecalciferol (VITAMIN D3) 25 MCG (1000 UNIT) tablet Take 1,000 Units by mouth daily.   donepezil (ARICEPT) 10 MG tablet Take 1 tablet (10 mg total) by mouth at bedtime.   famotidine (PEPCID) 20 MG tablet Take 1 tablet (20 mg total) by mouth 2 (two) times daily.   finasteride (PROSCAR) 5 MG tablet Take 1 tablet (5 mg  total) by mouth daily.   FLUoxetine (PROZAC) 20 MG capsule Take 1 capsule (20 mg total) by mouth daily.   losartan (COZAAR) 50 MG tablet Take 1 tablet (50 mg total) by mouth daily.   Multiple Vitamins-Minerals (CENTRUM SILVER) tablet Take 1 tablet by mouth daily.   pantoprazole (PROTONIX) 40 MG tablet Take 1 tablet (40 mg total) by mouth daily.   simvastatin (ZOCOR) 10 MG tablet Take 1 tablet (10 mg total) by mouth at bedtime.   tamsulosin (FLOMAX) 0.4 MG CAPS capsule    No facility-administered encounter medications on file as of 02/14/2020.    Current Diagnosis: Patient Active Problem List   Diagnosis Date Noted   Vitamin D deficiency 05/25/2019   Polyneuropathy, per NCS (2007) 05/02/2017   Left inguinal hernia 10/12/2016   PCP NOTES >>>>>> 12/18/2014   Incisional hernia, without obstruction or gangrene 09/03/2014   Ventral hernia 08/21/2014   Nonspecific elevation of levels of transaminase or lactic acid dehydrogenase (LDH) 08/20/2013   Annual physical exam 05/10/2011   Anxiety state 03/26/2008   Hyperglycemia 06/02/2007   Hyperlipidemia 06/02/2007   CVA 03/10/2007   INTERNAL HEMORRHOIDS 03/10/2007   Anemia 08/24/2006   DJD (degenerative joint disease) 08/24/2006   COLONIC POLYPS 06/28/2006   Essential hypertension 05/17/2006   ALLERGIC RHINITIS 05/17/2006   ESOPHAGEAL STRICTURE 05/17/2006   GERD 05/17/2006   BPH (benign prostatic hyperplasia)--sees urology 05/17/2006    Goals Addressed   None    Reviewed chart prior to disease state  call. Spoke with patient regarding BP  Recent Office Vitals: BP Readings from Last 3 Encounters:  05/25/19 (!) 151/59  05/05/19 (!) 135/44  02/10/19 (!) 142/48   Pulse Readings from Last 3 Encounters:  05/25/19 61  05/05/19 (!) 53  02/10/19 (!) 59    Wt Readings from Last 3 Encounters:  05/25/19 124 lb 4 oz (56.4 kg)  05/05/19 125 lb 4 oz (56.8 kg)  02/10/19 132 lb 4 oz (60 kg)     Kidney Function Lab Results  Component  Value Date/Time   CREATININE 1.07 02/13/2019 02:57 PM   CREATININE 1.19 08/03/2018 09:19 AM   CREATININE 0.79 01/22/2012 03:51 PM   CREATININE 0.83 12/12/2010 02:54 PM   GFR 64.84 02/13/2019 02:57 PM   GFRNONAA >60 04/10/2015 03:25 AM   GFRAA >60 04/10/2015 03:25 AM    BMP Latest Ref Rng & Units 02/13/2019 08/03/2018 10/11/2017  Glucose 70 - 99 mg/dL 152(H) 111(H) 142(H)  BUN 6 - 23 mg/dL 12 14 16   Creatinine 0.40 - 1.50 mg/dL 1.07 1.19 1.16  Sodium 135 - 145 mEq/L 132(L) 132(L) 131(L)  Potassium 3.5 - 5.1 mEq/L 3.8 4.5 4.3  Chloride 96 - 112 mEq/L 98 98 99  CO2 19 - 32 mEq/L 26 25 26   Calcium 8.4 - 10.5 mg/dL 8.7 9.6 9.0    Current antihypertensive regimen:  Amlodipine 5 mg daily Atenolol 50 mg 1/2 tab daily Losartan 50 mg daily   How often are you checking your Blood Pressure? several times per month   Current home BP readings: Patient daughter stated they don't write his blood pressure down but she remembers its been 154/80, 159/82 recently.   What recent interventions/DTPs have been made by any provider to improve Blood Pressure control since last CPP Visit: None.  Any recent hospitalizations or ED visits since last visit with CPP? Patient daughter stated no.   What diet changes have been made to improve Blood Pressure Control?  Patient daughter stated he drinks about 2-3 cups of coffee ant not much water. She stated he eats a good breakfast, no lunch and a early dinner around 4 pm. She sated he drinks a 1/2 cup of milk at night She stated the patient does not get any fruits but does eat a lot of vegetables, She stated he's basically a vegetarian, the only meat he will eat is fish and that's very, very rare.   What exercise is being done to improve your Blood Pressure Control?  Patient stated his daughter stated he mostly lays around but after eating he walks around the house a few times.   Adherence Review: Is the patient currently on ACE/ARB medication? No.  Does the  patient have >5 day gap between last estimated fill dates? No  Patient has been dizzy and when they check his blood pressure it has been a little on the high side.His daughter has been informed to check his blood pressure more often to better monitor it. She stated at this time she doesn't have any problems getting his medication they are all within her budget.    Follow-Up:  Pharmacist Review   Charlann Lange, RMA Clinical Pharmacist Assistant [phone]  11 minutes spent in review, coordination, and documentation. Patient has upcoming appointment with Dr. [name].  Recommend bringing in BP logs to address.  Reviewed by: Beverly Milch, PharmD Clinical Pharmacist [facility] [phone]

## 2020-02-19 ENCOUNTER — Telehealth: Payer: Self-pay

## 2020-02-19 NOTE — Telephone Encounter (Signed)
Appt changed 02/22/20 at 10:20am.

## 2020-02-19 NOTE — Telephone Encounter (Signed)
She is needing to change her father's appointment. Additional Comment The appointment is for 2/22 and they want to change for the 17th or 18th.  Telephone: [phone]

## 2020-02-22 ENCOUNTER — Other Ambulatory Visit: Payer: Self-pay

## 2020-02-22 ENCOUNTER — Ambulatory Visit (INDEPENDENT_AMBULATORY_CARE_PROVIDER_SITE_OTHER): Payer: Medicare Other | Admitting: Internal Medicine

## 2020-02-22 VITALS — BP 144/63 | HR 72 | Temp 98.1°F | Ht 65.0 in | Wt 132.8 lb

## 2020-02-22 DIAGNOSIS — I10 Essential (primary) hypertension: Secondary | ICD-10-CM | POA: Diagnosis not present

## 2020-02-22 DIAGNOSIS — R739 Hyperglycemia, unspecified: Secondary | ICD-10-CM

## 2020-02-22 DIAGNOSIS — E0590 Thyrotoxicosis, unspecified without thyrotoxic crisis or storm: Secondary | ICD-10-CM | POA: Diagnosis not present

## 2020-02-22 DIAGNOSIS — F518 Other sleep disorders not due to a substance or known physiological condition: Secondary | ICD-10-CM

## 2020-02-22 DIAGNOSIS — E079 Disorder of thyroid, unspecified: Secondary | ICD-10-CM

## 2020-02-22 DIAGNOSIS — F32A Depression, unspecified: Secondary | ICD-10-CM

## 2020-02-22 DIAGNOSIS — F419 Anxiety disorder, unspecified: Secondary | ICD-10-CM

## 2020-02-22 DIAGNOSIS — Z23 Encounter for immunization: Secondary | ICD-10-CM | POA: Diagnosis not present

## 2020-02-22 DIAGNOSIS — E785 Hyperlipidemia, unspecified: Secondary | ICD-10-CM

## 2020-02-22 LAB — CBC WITH DIFFERENTIAL/PLATELET
Basophils Absolute: 0.1 10*3/uL (ref 0.0–0.1)
Basophils Relative: 0.6 % (ref 0.0–3.0)
Eosinophils Absolute: 0.1 10*3/uL (ref 0.0–0.7)
Eosinophils Relative: 0.9 % (ref 0.0–5.0)
HCT: 34.1 % — ABNORMAL LOW (ref 39.0–52.0)
Hemoglobin: 11.7 g/dL — ABNORMAL LOW (ref 13.0–17.0)
Lymphocytes Relative: 12.3 % (ref 12.0–46.0)
Lymphs Abs: 1.7 10*3/uL (ref 0.7–4.0)
MCHC: 34.2 g/dL (ref 30.0–36.0)
MCV: 90.8 fl (ref 78.0–100.0)
Monocytes Absolute: 1.2 10*3/uL — ABNORMAL HIGH (ref 0.1–1.0)
Monocytes Relative: 9 % (ref 3.0–12.0)
Neutro Abs: 10.4 10*3/uL — ABNORMAL HIGH (ref 1.4–7.7)
Neutrophils Relative %: 77.2 % — ABNORMAL HIGH (ref 43.0–77.0)
Platelets: 198 10*3/uL (ref 150.0–400.0)
RBC: 3.75 Mil/uL — ABNORMAL LOW (ref 4.22–5.81)
RDW: 14 % (ref 11.5–15.5)
WBC: 13.5 10*3/uL — ABNORMAL HIGH (ref 4.0–10.5)

## 2020-02-22 LAB — TSH: TSH: 3.75 u[IU]/mL (ref 0.35–4.50)

## 2020-02-22 LAB — COMPREHENSIVE METABOLIC PANEL
ALT: 12 U/L (ref 0–53)
AST: 18 U/L (ref 0–37)
Albumin: 4 g/dL (ref 3.5–5.2)
Alkaline Phosphatase: 49 U/L (ref 39–117)
BUN: 27 mg/dL — ABNORMAL HIGH (ref 6–23)
CO2: 28 mEq/L (ref 19–32)
Calcium: 9.1 mg/dL (ref 8.4–10.5)
Chloride: 101 mEq/L (ref 96–112)
Creatinine, Ser: 1.27 mg/dL (ref 0.40–1.50)
GFR: 49.34 mL/min — ABNORMAL LOW (ref >60.00)
Glucose, Bld: 60 mg/dL — ABNORMAL LOW (ref 70–99)
Potassium: 4.4 mEq/L (ref 3.5–5.1)
Sodium: 134 mEq/L — ABNORMAL LOW (ref 135–145)
Total Bilirubin: 0.9 mg/dL (ref 0.2–1.2)
Total Protein: 6.7 g/dL (ref 6.0–8.3)

## 2020-02-22 LAB — LIPID PANEL
Cholesterol: 144 mg/dL (ref 0–200)
HDL: 45.5 mg/dL (ref >39.00)
NonHDL: 98.29
Total CHOL/HDL Ratio: 3
Triglycerides: 204 mg/dL — ABNORMAL HIGH (ref 0.0–149.0)
VLDL: 40.8 mg/dL — ABNORMAL HIGH (ref 0.0–40.0)

## 2020-02-22 LAB — LDL CHOLESTEROL, DIRECT: Direct LDL: 78 mg/dL

## 2020-02-22 LAB — HEMOGLOBIN A1C: Hgb A1c MFr Bld: 5.8 % (ref 4.6–6.5)

## 2020-02-22 LAB — T4, FREE: Free T4: 0.79 ng/dL (ref 0.60–1.60)

## 2020-02-22 MED ORDER — FAMOTIDINE 20 MG PO TABS: 20.0000 mg | ORAL_TABLET | Freq: Two times a day (BID) | ORAL | 1 refills | Status: AC

## 2020-02-22 MED ORDER — AMLODIPINE BESYLATE 5 MG PO TABS: 5.0000 mg | ORAL_TABLET | Freq: Every day | ORAL | 1 refills | Status: AC

## 2020-02-22 MED ORDER — PANTOPRAZOLE SODIUM 40 MG PO TBEC: 40.0000 mg | DELAYED_RELEASE_TABLET | Freq: Every day | ORAL | 1 refills | Status: AC

## 2020-02-22 MED ORDER — MEMANTINE HCL 5 MG PO TABS: 5.0000 mg | ORAL_TABLET | Freq: Two times a day (BID) | ORAL | 1 refills | Status: AC

## 2020-02-22 MED ORDER — SIMVASTATIN 10 MG PO TABS: 10.0000 mg | ORAL_TABLET | Freq: Every day | ORAL | 1 refills | Status: AC

## 2020-02-22 MED ORDER — DONEPEZIL HCL 10 MG PO TABS: 10.0000 mg | ORAL_TABLET | Freq: Every day | ORAL | 1 refills | Status: AC

## 2020-02-22 MED ORDER — ATENOLOL 50 MG PO TABS: 25.0000 mg | ORAL_TABLET | Freq: Every day | ORAL | 1 refills | Status: AC

## 2020-02-22 MED ORDER — FINASTERIDE 5 MG PO TABS: 5.0000 mg | ORAL_TABLET | Freq: Every day | ORAL | 1 refills | Status: AC

## 2020-02-22 MED ORDER — LOSARTAN POTASSIUM 50 MG PO TABS: 50.0000 mg | ORAL_TABLET | Freq: Every day | ORAL | 1 refills | Status: AC

## 2020-02-22 MED ORDER — FLUOXETINE HCL 20 MG PO CAPS: 20.0000 mg | ORAL_CAPSULE | Freq: Every day | ORAL | 0 refills | Status: DC

## 2020-02-22 NOTE — Progress Notes (Signed)
Subjective:    Patient ID: [name], male    DOB: 1926-02-12, 87 y.o.   MRN: [id number]  DOS:  02/22/2020 Type of visit - description: Follow-up.  Here with his daughter  Multiple issues discussed  The main concern for the family is that the patient has weird dreams: Out of 8 hours of his sleep he talks for 6 hours, sometimes laughing, sometimes arguing. They do not know if he kicks or moves uncontrollably.  Dreams are sometimes scary  Ambulatory BPs in the 120s  Depression, dementia: Patient lost his wife-2021, currently living with his family in [place], he has times when he gets somewhat depressed, angry and upset but he pushes on trying to feel better. He is more forgetful than before, no apparent behavioral issues such as wandering or violence.  He is very [employer]. Overall the patient's daughter think is doing well  Review of Systems Occasionally/urinary urgency but no dysuria, gross hematuria or difficulty urinating  Past Medical History:  Diagnosis Date  . Allergic rhinitis   . Anemia   . Anxiety   . BPH (benign prostatic hypertrophy)    with weak urinary stream  . Cholecystoduodenal fistula 2016   Dr. Georgette Dover  . Common bile duct stone   . Diabetes mellitus   . Elevated PSA   . Esophageal stricture   . GERD (gastroesophageal reflux disease)    w/ esoph stricture  . Hiatal hernia   . History of colonic polyps   . Hyperlipidemia   . Hypertension   . Inguinal hernia 2016   Dr. Georgette Dover, Pt does not want to proceed with repair at this time  . Internal hemorrhoids   . Osteoarthritis   . Polyneuropathy    NCS by neurology per pt 5/07  . Stroke [facility])    CVA, per CT "old stroke"    Past Surgical History:  Procedure Laterality Date  . CARDIOVASCULAR STRESS TEST  02-2010   done in [place], had CP, +EKG changes, neg nuclear  imagins  . CATARACT EXTRACTION Bilateral 01/2009  . CATARACT EXTRACTION, BILATERAL    . CHOLECYSTECTOMY N/A 08/16/2013   Procedure:  LAPAROSCOPIC CONVERTED TO OPEN  CHOLECYSTECTOMY WITH INTRAOPERATIVE CHOLANGIOGRAM;  Surgeon: Imogene Burn. Georgette Dover, MD;  Location: [place];  Service: General;  Laterality: N/A;  . ENT  surgery for snoring remotely (Dr Lucia Gaskins)    . ERCP W/ SPHICTEROTOMY     ????? Dr Deatra Ina  . HERNIA REPAIR     bil inguinal- per pt L in 90s, R in the 80s  . TOTAL KNEE ARTHROPLASTY Right 05/2006  . TOTAL KNEE ARTHROPLASTY Left 08/2006    Allergies as of 02/22/2020      Reactions   Amoxicillin Other (See Comments)   "loose motions" "giddiness" Has patient had a PCN reaction causing immediate rash, facial/tongue/throat swelling, SOB or lightheadedness with hypotension: No Has patient had a PCN reaction causing severe rash involving mucus membranes or skin necrosis: No Has patient had a PCN reaction that required hospitalization No Has patient had a PCN reaction occurring within the last 10 years: No If all of the above answers are "NO", then may proceed with Cephalosporin use.      Medication List       Accurate as of February 22, 2020 11:59 PM. If you have any questions, ask your nurse or doctor.        alfuzosin 10 MG 24 hr tablet Commonly known as: UROXATRAL Take 10 mg by mouth daily.  AMBULATORY NON FORMULARY MEDICATION Medication Name: Abdominal Binder Dx: Ventral Hernia (K43.9)   amLODipine 5 MG tablet Commonly known as: NORVASC Take 1 tablet (5 mg total) by mouth daily.   aspirin EC 81 MG tablet Take 1 tablet (81 mg total) by mouth daily.   atenolol 50 MG tablet Commonly known as: TENORMIN Take 0.5 tablets (25 mg total) by mouth daily.   Centrum Silver tablet Take 1 tablet by mouth daily.   cholecalciferol 25 MCG (1000 UNIT) tablet Commonly known as: VITAMIN D3 Take 1,000 Units by mouth daily.   donepezil 10 MG tablet Commonly known as: ARICEPT Take 1 tablet (10 mg total) by mouth at bedtime.   famotidine 20 MG tablet Commonly known as: PEPCID Take 1 tablet (20 mg total) by  mouth 2 (two) times daily.   finasteride 5 MG tablet Commonly known as: PROSCAR Take 1 tablet (5 mg total) by mouth daily.   FLUoxetine 20 MG capsule Commonly known as: PROZAC Take 1 capsule (20 mg total) by mouth daily.   Glucosamine 750 MG Tabs Take by mouth.   losartan 50 MG tablet Commonly known as: COZAAR Take 1 tablet (50 mg total) by mouth daily.   memantine 5 MG tablet Commonly known as: Namenda Take 1 tablet (5 mg total) by mouth 2 (two) times daily. Started by: Kathlene November, MD   pantoprazole 40 MG tablet Commonly known as: PROTONIX Take 1 tablet (40 mg total) by mouth daily.   simvastatin 10 MG tablet Commonly known as: ZOCOR Take 1 tablet (10 mg total) by mouth at bedtime.   tamsulosin 0.4 MG Caps capsule Commonly known as: FLOMAX          Objective:   Physical Exam BP (!) 144/63 (BP Location: Right Arm, Patient Position: Sitting, Cuff Size: Large)   Pulse 72   Temp 98.1 F (36.7 C) (Oral)   Ht 5\' 5"  (1.651 m)   Wt 132 lb 12.8 oz (60.2 kg)   SpO2 96%   BMI 22.10 kg/m  General:   Well developed, NAD, BMI noted. HEENT:  Normocephalic . Face symmetric, atraumatic Lungs:  CTA B Normal respiratory effort, no intercostal retractions, no accessory muscle use. Heart: RRR,  no murmur.  Lower extremities: no pretibial edema bilaterally  Skin: Not pale. Not jaundice Neurologic:  alert & pleasent, cooperative, follow all commands Speech normal, gait appropriate for age and unassisted Psych--   Behavior appropriate.  He seems to be in very good spirits today No anxious or depressed appearing.      Assessment     Assessment  Prediabetes Polyneuropathy by NCS, saw neurology 2007  HTN Hyperlipidemia Anxiety DJD: See surgeries GERD with esophageal stricture, HH H/o anemia  BPH, increase PSA, sees urology H/o Stroke ("old stroke" per CT) H/o  hernia, 2016, declined repair Admitted 04/2015: UTI, sepsis, Escherichia coli HOH tried 3 different  hearing aids before OSA ?  PLAN Last visit 05/2019, lost his wife 07-2019.  Here with his daughter Unusual/weird dreams: See HPI, this is one of the main concerns for the patient and the family, refer to neurology, sleep medicine. Prediabetes: Check A1c HTN: On amlodipine, Tenormin, losartan, ambulatory BPs in the 120s, check a CMP and CBC. Hyperlipidemia: On simvastatin, FLP. Dementia: Currently on Aricept 10 mg, he is more forgetful, I have not been able to do a formal mental exam today due to a number of issues I'm  Addressing today , the family reports he is more forgetful, no behavioral issues.  At times he get somewhat depressed and angry because he lost his wife and because he is very limited on what he can do. We discussed possibly adding Namenda and the patient's daughter is in favor of that: Start with 5 mg daily, if no side effects increase to 5 mg twice daily Anxiety, depression: On fluoxetine, no change. Increase TSH: TFTs Social: He is actually lives in [place] w./ his family and is coming to [place] from time to time.  Recommend to come back to this office in 3 months. Preventive care: Flu shot today RTC 3 months   Time spent 40 minutes due to the number of issues addressed, we had a long conversation about his sleep behavior.  This visit occurred during the SARS-CoV-2 public health emergency.  Safety protocols were in place, including screening questions prior to the visit, additional usage of staff PPE, and extensive cleaning of exam room while observing appropriate contact time as indicated for disinfecting solutions.

## 2020-02-22 NOTE — Patient Instructions (Addendum)
Continue checking your blood pressure BP GOAL is between 110/65 and  145/85. If it is consistently higher or lower, let me know  Start the new medication called Namenda 5 mg: 1 tablet a day for the first month, if there is no side effect increase to 1 tablet twice a day  GO TO THE LAB : Get the blood work     [place], [place] back for a checkup in 3 months

## 2020-02-23 NOTE — Assessment & Plan Note (Signed)
Last visit 05/2019, lost his wife 07-2019.  Here with his daughter Unusual/weird dreams: See HPI, this is one of the main concerns for the patient and the family, refer to neurology, sleep medicine. Prediabetes: Check A1c HTN: On amlodipine, Tenormin, losartan, ambulatory BPs in the 120s, check a CMP and CBC. Hyperlipidemia: On simvastatin, FLP. Dementia: Currently on Aricept 10 mg, he is more forgetful, I have not been able to do a formal mental exam today due to a number of issues I'm  Addressing today , the family reports he is more forgetful, no behavioral issues.  At times he get somewhat depressed and angry because he lost his wife and because he is very limited on what he can do. We discussed possibly adding Namenda and the patient's daughter is in favor of that: Start with 5 mg daily, if no side effects increase to 5 mg twice daily Anxiety, depression: On fluoxetine, no change. Increase TSH: TFTs Social: He is actually lives in [place] w./ his family and is coming to [place] from time to time.  Recommend to come back to this office in 3 months. Preventive care: Flu shot today RTC 3 months

## 2020-02-27 ENCOUNTER — Ambulatory Visit: Payer: Medicare Other | Admitting: Internal Medicine

## 2020-02-28 ENCOUNTER — Telehealth: Payer: Self-pay | Admitting: Internal Medicine

## 2020-02-28 NOTE — Telephone Encounter (Signed)
Patient referred to [facility], patient called by their office today and patient's daughter stated that patient will be leaving [place] go out [place] and will call their office back to schedule sleep study when they return to [place]

## 2020-02-28 NOTE — Telephone Encounter (Signed)
Noted, thank you

## 2020-02-28 NOTE — Telephone Encounter (Signed)
Just an FYI

## 2020-03-05 ENCOUNTER — Other Ambulatory Visit: Payer: Self-pay

## 2020-03-05 ENCOUNTER — Ambulatory Visit: Payer: Medicare Other | Admitting: Podiatry

## 2020-03-05 ENCOUNTER — Encounter: Payer: Self-pay | Admitting: Podiatry

## 2020-03-05 DIAGNOSIS — E1142 Type 2 diabetes mellitus with diabetic polyneuropathy: Secondary | ICD-10-CM | POA: Diagnosis not present

## 2020-03-05 DIAGNOSIS — D2371 Other benign neoplasm of skin of right lower limb, including hip: Secondary | ICD-10-CM | POA: Diagnosis not present

## 2020-03-05 DIAGNOSIS — B351 Tinea unguium: Secondary | ICD-10-CM | POA: Diagnosis not present

## 2020-03-05 DIAGNOSIS — D2372 Other benign neoplasm of skin of left lower limb, including hip: Secondary | ICD-10-CM

## 2020-03-05 DIAGNOSIS — M79676 Pain in unspecified toe(s): Secondary | ICD-10-CM

## 2020-03-05 DIAGNOSIS — M778 Other enthesopathies, not elsewhere classified: Secondary | ICD-10-CM

## 2020-03-05 MED ORDER — DEXAMETHASONE SODIUM PHOSPHATE 120 MG/30ML IJ SOLN
2.0000 mg | Freq: Once | INTRAMUSCULAR | Status: AC
  Administered 2020-03-05: 2 mg via INTRA_ARTICULAR

## 2020-03-05 NOTE — Progress Notes (Signed)
[name] presents today with his daughter having just lost his wife about 7 months ago.  He states that his nails are long and his big toe joint hurts.  Objective: Vital signs are stable alert oriented x3.  Pulses are palpable.  Neurologic sensorium is intact hallux valgus deformity with pain on range of motion of the first metatarsophalangeal joint of the right foot.  Toenails are long thick yellow dystrophic-like mycotic with large reactive hyperkeratotic lesion medial aspect of the first metatarsophalangeal joint distal aspect of the second toe and plantar foot right.  Left foot is relatively rectus and in good condition and in a neutral position.  Assessment: Hallux abductovalgus deformity pain in limb secondary to onychomycosis capsulitis of the first metatarsophalangeal joint.  Benign skin lesions.  Plan: Debridement of benign skin lesions debridement of nails 1 through 5 bilateral injected the first metatarsophalangeal joint of the right foot with dexamethasone local anesthetic after sterile Betadine skin prep.  Tolerated procedure well.  I will follow-up with him on an as-needed basis.

## 2020-03-06 DIAGNOSIS — R350 Frequency of micturition: Secondary | ICD-10-CM | POA: Diagnosis not present

## 2020-03-07 DIAGNOSIS — H04123 Dry eye syndrome of bilateral lacrimal glands: Secondary | ICD-10-CM | POA: Diagnosis not present

## 2020-03-07 DIAGNOSIS — H11823 Conjunctivochalasis, bilateral: Secondary | ICD-10-CM | POA: Diagnosis not present

## 2020-03-07 DIAGNOSIS — H26493 Other secondary cataract, bilateral: Secondary | ICD-10-CM | POA: Diagnosis not present

## 2020-03-07 DIAGNOSIS — H10413 Chronic giant papillary conjunctivitis, bilateral: Secondary | ICD-10-CM | POA: Diagnosis not present

## 2020-03-07 DIAGNOSIS — Z961 Presence of intraocular lens: Secondary | ICD-10-CM | POA: Diagnosis not present

## 2020-03-12 ENCOUNTER — Ambulatory Visit: Payer: Medicare Other | Admitting: Podiatry

## 2020-03-12 DIAGNOSIS — Z20822 Contact with and (suspected) exposure to covid-19: Secondary | ICD-10-CM | POA: Diagnosis not present

## 2020-03-12 DIAGNOSIS — Z03818 Encounter for observation for suspected exposure to other biological agents ruled out: Secondary | ICD-10-CM | POA: Diagnosis not present

## 2020-03-14 ENCOUNTER — Ambulatory Visit: Payer: Medicare Other | Admitting: Internal Medicine

## 2020-03-14 ENCOUNTER — Encounter: Payer: Self-pay | Admitting: Internal Medicine

## 2020-03-19 ENCOUNTER — Telehealth: Payer: Self-pay | Admitting: Internal Medicine

## 2020-03-19 NOTE — Telephone Encounter (Signed)
Patient's daughter would like to go over patient medication list

## 2020-03-20 NOTE — Telephone Encounter (Signed)
Spoke w/ Shalini- answered questions. She will call back if further questions.

## 2020-05-06 ENCOUNTER — Other Ambulatory Visit: Payer: Self-pay | Admitting: Internal Medicine

## 2020-05-27 ENCOUNTER — Ambulatory Visit (INDEPENDENT_AMBULATORY_CARE_PROVIDER_SITE_OTHER): Payer: Medicare Other | Admitting: Pharmacist

## 2020-05-27 DIAGNOSIS — E785 Hyperlipidemia, unspecified: Secondary | ICD-10-CM

## 2020-05-27 DIAGNOSIS — I10 Essential (primary) hypertension: Secondary | ICD-10-CM | POA: Diagnosis not present

## 2020-05-27 DIAGNOSIS — R739 Hyperglycemia, unspecified: Secondary | ICD-10-CM

## 2020-05-29 NOTE — Patient Instructions (Signed)
[name]  It was a pleasure speaking with you today. Please feel free to contact me if you have any questions or concerns. Below is information regarding you health goals.   Cherre Robins, PharmD Clinical Pharmacist [facility] [address] [place] [facility] [phone]  Visit Information  PATIENT GOALS: Goals Addressed            This Visit's Progress   . Chronic Care Management Pharmacy Care Plan   Not on track    [place] (see longitudinal plan of care for additional care plan information)  Current Barriers:  . Chronic Disease Management support, education, and care coordination needs related to Pre-Diabetes, Hypertension, Hyperlipidemia/Hx of CVA, Memory Concern, BPH, Depression, GERD   Hypertension BP Readings from Last 3 Encounters:  02/22/20 (!) 144/63  05/25/19 (!) 151/59  05/05/19 (!) 135/44   . Pharmacist Clinical Goal(s): o Over the next 90 days, patient will work with PharmD and providers to achieve BP goal <140/90 . Current regimen:   Amlodipine 5mg  daily  Atenolol 50mg  1/2 tab daily  Losartan 50mg  daily  . Interventions: o Discussed blood pressure goal . Patient self care activities - Over the next 90 days, patient will: o Purchase new blood pressure cuff, check blood pressure 1 to 2 times per week, document, and provide at future appointments o Ensure daily salt intake < 2300 mg/day o Continue current therapy for blood pressure  Hyperlipidemia Lab Results  Component Value Date/Time   LDLDIRECT 78.0 02/22/2020 11:39 AM   . Pharmacist Clinical Goal(s): o Over the next 90 days, patient will work with PharmD and providers to achieve LDL goal < 70 . Current regimen:  o Simvastatin 10mg  daily at bedtime . Interventions: . Continue with dietary changes over last few months - limiting sweets and saturated fat . Patient self care activities - Over the next 90 days, patient will: o Maintain cholesterol medication regimen.   Pre-Diabetes Lab  Results  Component Value Date/Time   HGBA1C 5.8 02/22/2020 11:39 AM   HGBA1C 5.7 08/03/2018 09:19 AM   . Pharmacist Clinical Goal(s): o Over the next 90 days, patient will work with PharmD and providers to maintain A1c goal <6.5% . Current regimen:  o Diet and exercise management   . Interventions: . Continue current diet - limiting intake of sugar and satruated fat . Patient self care activities - Over the next 90 days, patient will: o Maintain a1c <6.5% o Continue current diet  BPH / Prostate: . Pharmacist Clinical Goal(s): o Over the next 90 days, patient will work with PharmD and providers to decrease night times urination and urinary hesitancy . Current regimen:  o Alfuzosin 10mg  daily o Finasteride 5mg  daily  . Interventions: . Coordinated with urology office to verify above regimen for BPH Patient self care activities - Over the next 90 days, patient will: . Restart Alfuzosin 10mg  - take 1 tablet daily  . Monitor blood pressure and for dizziness.   Medication management . Pharmacist Clinical Goal(s): o Over the next 90 days, patient will work with PharmD and providers to maintain optimal medication adherence . Current pharmacy: [facility] . Interventions o Comprehensive medication review performed. o Continue current medication management strategy . Patient self care activities - Over the next 90 days, patient will: o Focus on medication adherence by filling and taking medications appropriately  o Take medications as prescribed o Report any questions or concerns to PharmD and/or provider(s) o Use over the counter benefits available  thru [employer].  Please see past updates related to this goal by clicking on the "Past Updates" button in the selected goal         Patient verbalizes understanding of instructions provided today and agrees to view in [place].   The care management team will reach out to the patient again over the next 90 days.

## 2020-05-29 NOTE — Chronic Care Management (AMB) (Signed)
Chronic Care Management Pharmacy Note  05/29/2020 Name:  [name] MRN:  [id number] DOB:  02/15/1926  Subjective: [name] is an 87 y.o. year old male who is a primary patient of Paz, Alda Berthold, MD.  The CCM team was consulted for assistance with disease management and care coordination needs.    Engaged with patient's primary caregiver, his daughter by telephone for follow up visit in response to provider referral for pharmacy case management and/or care coordination services.   Consent to Services:  The patient was given information about Chronic Care Management services, agreed to services, and gave verbal consent prior to initiation of services.  Please see initial visit note for detailed documentation.   Patient Care Team: Colon Branch, MD as PCP - General Donnie Mesa, MD as Consulting Physician (General Surgery) Festus Aloe, MD as Consulting Physician (Urology) Vicie Mutters, MD as Consulting Physician (Otolaryngology) Cherre Robins, PharmD (Pharmacist)  Recent office visits:  02/22/2020 - PCP (Dr [name]) - Dreams / talking in sleep; depression / dementia. Referred to neurology for dreams / sleep; Added namenda $RemoveBefor'5mg'fkEJxuNzBWil$  qd, if no side effects increased to $RemoveBefo'5mg'WSmDmJfuIBh$  bid. Received flu vaccine  Recent consult visits: 03/05/2020 - Podiatry (Dr Milinda Pointer) Neuropathy and Nail Debridement. No medications changed.  Hospital visits: None in previous 6 months  Objective:  Lab Results  Component Value Date   CREATININE 1.27 02/22/2020   CREATININE 1.07 02/13/2019   CREATININE 1.19 08/03/2018    Lab Results  Component Value Date   HGBA1C 5.8 02/22/2020   Last diabetic Eye exam:  Lab Results  Component Value Date/Time   HMDIABEYEEXA No Retinopathy 11/06/2014 12:00 AM    Last diabetic Foot exam:  Lab Results  Component Value Date/Time   HMDIABFOOTEX yes 02/19/2009 12:00 AM        Component Value Date/Time   CHOL 144 02/22/2020 1139   TRIG 204.0 (H) 02/22/2020 1139    HDL 45.50 02/22/2020 1139   CHOLHDL 3 02/22/2020 1139   VLDL 40.8 (H) 02/22/2020 1139   LDLCALC 37 08/03/2018 0919   LDLDIRECT 78.0 02/22/2020 1139    Hepatic Function Latest Ref Rng & Units 02/22/2020 08/03/2018 05/03/2017  Total Protein 6.0 - 8.3 g/dL 6.7 7.2 -  Albumin 3.5 - 5.2 g/dL 4.0 4.5 -  AST 0 - 37 U/L $Remo'18 19 17  'iuiDN$ ALT 0 - 53 U/L $Remo'12 11 11  'CMMWB$ Alk Phosphatase 39 - 117 U/L 49 42 -  Total Bilirubin 0.2 - 1.2 mg/dL 0.9 1.7(H) -  Bilirubin, Direct 0.0 - 0.3 mg/dL - - -    Lab Results  Component Value Date/Time   TSH 3.75 02/22/2020 11:39 AM   TSH 2.44 02/13/2019 02:57 PM   FREET4 0.79 02/22/2020 11:39 AM   FREET4 0.93 02/13/2019 02:57 PM    CBC Latest Ref Rng & Units 02/22/2020 08/03/2018 10/11/2017  WBC 4.0 - 10.5 K/uL 13.5(H) 6.8 6.5  Hemoglobin 13.0 - 17.0 g/dL 11.7(L) 13.6 12.5(L)  Hematocrit 39.0 - 52.0 % 34.1(L) 38.7(L) 35.7(L)  Platelets 150.0 - 400.0 K/uL 198.0 200.0 180.0    Lab Results  Component Value Date/Time   VD25OH 20.04 (L) 02/13/2019 02:57 PM    Clinical ASCVD: Yes  The ASCVD Risk score Mikey Bussing DC Jr., et al., 2013) failed to calculate for the following reasons:   The 2013 ASCVD risk score is only valid for ages 29 to 71   The patient has a prior MI or stroke diagnosis    Other: (CHADS2VASc if  Afib, PHQ9 if depression, MMRC or CAT for COPD, ACT, DEXA)  Social History   Tobacco Use  Smoking Status Former Smoker  Smokeless Tobacco Former Systems developer  . Quit date: 01/05/1961   BP Readings from Last 3 Encounters:  02/22/20 (!) 144/63  05/25/19 (!) 151/59  05/05/19 (!) 135/44   Pulse Readings from Last 3 Encounters:  02/22/20 72  05/25/19 61  05/05/19 (!) 53   Wt Readings from Last 3 Encounters:  02/22/20 132 lb 12.8 oz (60.2 kg)  05/25/19 124 lb 4 oz (56.4 kg)  05/05/19 125 lb 4 oz (56.8 kg)    Assessment: Review of patient past medical history, allergies, medications, health status, including review of consultants reports, laboratory and other test  data, was performed as part of comprehensive evaluation and provision of chronic care management services.   SDOH:  (Social Determinants of Health) assessments and interventions performed:  SDOH Interventions   Flowsheet Row Most Recent Value  SDOH Interventions   Transportation Interventions Intervention Not Indicated      CCM Care Plan  Allergies  Allergen Reactions  . Amoxicillin Other (See Comments)    "loose motions" "giddiness" Has patient had a PCN reaction causing immediate rash, facial/tongue/throat swelling, SOB or lightheadedness with hypotension: No Has patient had a PCN reaction causing severe rash involving mucus membranes or skin necrosis: No Has patient had a PCN reaction that required hospitalization No Has patient had a PCN reaction occurring within the last 10 years: No If all of the above answers are "NO", then may proceed with Cephalosporin use.     Medications Reviewed Today    Reviewed by Cherre Robins, PharmD (Pharmacist) on 05/27/20 at 1406  Med List Status: <None>  Medication Order Taking? Sig Documenting Provider Last Dose Status Informant  alfuzosin (UROXATRAL) 10 MG 24 hr tablet [id number] No Take 10 mg by mouth daily with breakfast.  Patient not taking: Reported on 05/27/2020   [provider] Not Taking Active   Camillo Flaming MEDICATION [id number]  Medication Name: Abdominal Binder Dx: Ventral Hernia (K43.9) Colon Branch, MD  Active Self  amLODipine (NORVASC) 5 MG tablet [id number] Yes Take 1 tablet (5 mg total) by mouth daily. Colon Branch, MD Taking Active   aspirin EC 81 MG tablet [id number] Yes Take 1 tablet (81 mg total) by mouth daily. Colon Branch, MD Taking Active   atenolol (TENORMIN) 50 MG tablet [id number] Yes Take 0.5 tablets (25 mg total) by mouth daily. Colon Branch, MD Taking Active   cholecalciferol (VITAMIN D3) 25 MCG (1000 UNIT) tablet [id number] Yes Take 1,000 Units by mouth daily. [provider] Taking Active    donepezil (ARICEPT) 10 MG tablet [id number] Yes Take 1 tablet (10 mg total) by mouth at bedtime. Colon Branch, MD Taking Active   famotidine (PEPCID) 20 MG tablet [id number] Yes Take 1 tablet (20 mg total) by mouth 2 (two) times daily. Colon Branch, MD Taking Active   finasteride (PROSCAR) 5 MG tablet [id number] Yes Take 1 tablet (5 mg total) by mouth daily. Colon Branch, MD Taking Active   FLUoxetine (PROZAC) 20 MG capsule [id number] Yes Take 1 capsule (20 mg total) by mouth daily. Colon Branch, MD Taking Active   Glucosamine 750 MG TABS [id number] Yes Take by mouth. [provider] Taking Active   losartan (COZAAR) 50 MG tablet [id number] Yes Take 1 tablet (50 mg total) by mouth daily. Colon Branch, MD Taking Active   memantine [facility])  5 MG tablet [id number] Yes Take 1 tablet (5 mg total) by mouth 2 (two) times daily. Colon Branch, MD Taking Active   Multiple Vitamins-Minerals (CENTRUM SILVER) tablet [id number] Yes Take 1 tablet by mouth daily. [provider] Taking Active Self  OVER THE COUNTER MEDICATION [id number] Yes CBD Oil - takes 3 drops twice a day. [provider] Taking Active   pantoprazole (PROTONIX) 40 MG tablet [id number] Yes Take 1 tablet (40 mg total) by mouth daily. Colon Branch, MD Taking Active   simvastatin (ZOCOR) 10 MG tablet [id number] Yes Take 1 tablet (10 mg total) by mouth at bedtime. Colon Branch, MD Taking Active           Patient Active Problem List   Diagnosis Date Noted  . Vitamin D deficiency 05/25/2019  . Polyneuropathy, per N[facility] (2007) 05/02/2017  . Left inguinal hernia 10/12/2016  . PCP NOTES >>>>>> 12/18/2014  . Incisional hernia, without obstruction or gangrene 09/03/2014  . Ventral hernia 08/21/2014  . Nonspecific elevation of levels of transaminase or lactic acid dehydrogenase (LDH) 08/20/2013  . Annual physical exam 05/10/2011  . Anxiety state 03/26/2008  . Hyperglycemia 06/02/2007  . Hyperlipidemia 06/02/2007  . CVA 03/10/2007  .  INTERNAL HEMORRHOIDS 03/10/2007  . Anemia 08/24/2006  . DJD (degenerative joint disease) 08/24/2006  . COLONIC POLYPS 06/28/2006  . Essential hypertension 05/17/2006  . ALLERGIC RHINITIS 05/17/2006  . ESOPHAGEAL STRICTURE 05/17/2006  . GERD 05/17/2006  . BPH (benign prostatic hyperplasia)--sees urology 05/17/2006    Immunization History  Administered Date(s) Administered  . Fluad Quad(high Dose 65+) 09/30/2018, 02/22/2020  . Influenza Whole 09/28/2007, 10/05/2009  . Influenza, High Dose Seasonal PF 10/17/2012, 09/29/2013, 09/30/2015, 10/09/2016, 10/11/2017  . Influenza,inj,Quad PF,6+ Mos 09/21/2014, 09/21/2014  . PFIZER(Purple Top)SARS-COV-2 Vaccination 02/18/2019, 03/15/2019, 12/21/2019  . Pneumococcal Conjugate-13 12/15/2013  . Pneumococcal Polysaccharide-23 10/05/2004, 10/11/2017  . Td 11/26/1998, 03/17/2010    Conditions to be addressed/monitored: HTN, HLD, Dementia and BPH, GERD; DJD  Care Plan : General Pharmacy (Adult)  Updates made by Cherre Robins, PHARMD since 05/29/2020 12:00 AM    Problem: Medication management and care management for HTN; HDL; pre DM; BPH; depression; DJD; GERD; low vitamin D   Priority: High  Onset Date: 05/27/2020  Note:   Current Barriers:  . Unable to maintain control of HTN and hyperlipidemia . Does not adhere to prescribed medication regimen . Does not maintain contact with provider office . Patient is currently living in [place] with his daughter. Has been challenging to get back for medical appointments and to get medications (they are using mailorder now)  Pharmacist Clinical Goal(s):  Marland Kitchen Over the next 90 days, patient will achieve adherence to monitoring guidelines and medication adherence to achieve therapeutic efficacy . achieve control of HTN and hyperlipidemia as evidenced by BP <140 / 90 and LDL <70 . contact provider office for questions/concerns as evidenced notation of same in electronic health record through collaboration with  PharmD and provider.   Interventions: . 1:1 collaboration with Colon Branch, MD regarding development and update of comprehensive plan of care as evidenced by provider attestation and co-signature . Inter-disciplinary care team collaboration (see longitudinal plan of care) . Comprehensive medication review performed; medication list updated in electronic medical record  Hypertension . Uncontrolled; BP goal <140/90 . Current regimen:   Amlodipine $RemoveBef'5mg'pngmRjNpEO$  daily  Atenolol $RemoveB'50mg'mXbpKXIg$  1/2 tab daily  Losartan $RemoveB'50mg'zWdlJKYN$  daily  . Interventions: o Discussed blood pressure goal o Recommended purchase new blood pressure cuff, check blood  pressure 1 to 2 times per week, document, and provide at future appointments o Ensure daily salt intake < 2300 mg/day o Continue current therapy for blood pressure  Hyperlipidemia Lab Results  Component Value Date/Time   LDLDIRECT 78.0 02/22/2020 11:39 AM   . Not at goal of LDL goal < 70 . Current regimen:  o Simvastatin $RemoveBefor'10mg'ZvxznmRXHkDM$  daily at bedtime . Interventions: . Continue with dietary changes over last few months - limiting sweets and saturated fat . Consider rechecking lipids at next office visit; If LDL still >70 could consider increasing simvastatin to $RemoveBefore'20mg'rAyoqXxZDCKAU$  daily     Pre-Diabetes Lab Results  Component Value Date/Time   HGBA1C 5.8 02/22/2020 11:39 AM   HGBA1C 5.7 08/03/2018 09:19 AM   . Current at goal of A1c <6.5% . Current regimen:  o Diet and exercise management   . Interventions: . Continue current diet - limiting intake of sugar and satruated fat o Maintain a1c <6.5%  BPH / Prostate: Goal is to decrease night time urination and urinary hesitancy . Daughter reports that patient gets up 1 or 2 times per night . Current regimen:  o Alfuzosin $RemoveBef'10mg'MlkhnJMaxm$  daily (on med list but daughter states patient has not been taking) o Finasteride $RemoveBefor'5mg'oVWBbzBYwrUb$  daily  . Past medications - tamsulosin stopped due to dizziness.  . Interventions: . Coordinated with urology office to  verify above regimen for BPH . Restart Alfuzosin $RemoveBeforeDEI'10mg'JxbnCJvFoSlPsLVO$  - take 1 tablet daily  . Monitor blood pressure and dizziness.   Depression:  . Patient's wife died about 9 months ago.  Marland Kitchen He is living with his family in [place] and hoping to gain citizenship.  . Current Therapy:  o Fluoxetine $RemoveBefo'20mg'xRutzXSIPNH$  daily  . Daughter mentions that patient's weight this morning was 122lbs (this is about 10 lbs lower than in Feb 2022). She states that patient is eating fewer sweets and more vegetables. She feels that his mood is good and not affecting his appetite.  . Interventions:  o Recommended daughter continue to monitor weight 2 to 3 times per week. If weight continues to decrease should have workup by physician.  o Could consider mirtazapine is appetite decreased and continue weight loss.   Medication management . Current pharmacy: [facility] . Interventions o Comprehensive medication review performed. o Continue current medication management strategy o Educated about and recommended using over the counter benefits available with [employer] benefits   Patient Goals/Self-Care Activities . Over the next 90 days, patient will:  take medications as prescribed and check blood pressure 1 to 2 times per week, document, and provide at future appointments  Follow Up Plan: The care management team will reach out to the patient again over the next 90 days.        Medication Assistance: None required.  Patient affirms current coverage meets needs.  Patient's preferred pharmacy is:  [facility], [address], [address], [place] [address] Phone: [phone] Fax: [facility] [address] [place] [address] Phone: [phone] Fax: [phone]  Daughter assists with medication administration. With the exception of alfuzosin $RemoveBefo'10mg'DoeTLBIalDf$  daily she endorses that patient is  adherent to current medications  Follow Up:  Patient agrees to Care Plan and Follow-up.  Plan: The care management team will reach out to the patient again over the next 90 days.  Cherre Robins, PharmD Clinical Pharmacist [facility] [phone]

## 2020-06-12 ENCOUNTER — Telehealth: Payer: Self-pay | Admitting: Internal Medicine

## 2020-06-12 NOTE — Telephone Encounter (Signed)
Pt, daughter called she would like a copy of all  pt, medicine list e mail to her  [email or web address]  Please advice

## 2020-06-12 NOTE — Telephone Encounter (Signed)
Med list emailed.

## 2020-07-10 ENCOUNTER — Telehealth: Payer: Self-pay | Admitting: Pharmacist

## 2020-07-10 NOTE — Chronic Care Management (AMB) (Signed)
    Chronic Care Management Pharmacy Assistant   Name: [name]  MRN: [id number] DOB: 06-15-1926    Reason for Encounter: Disease State - General    Recent office visits:  None noted.  Recent consult visits:  None noted.  Hospital visits:  None in previous 6 months  Medications: Outpatient Encounter Medications as of 07/10/2020  Medication Sig   alfuzosin (UROXATRAL) 10 MG 24 hr tablet Take 10 mg by mouth daily with breakfast. (Patient not taking: Reported on 05/27/2020)   AMBULATORY NON FORMULARY MEDICATION Medication Name: Abdominal Binder Dx: Ventral Hernia (K43.9)   amLODipine (NORVASC) 5 MG tablet Take 1 tablet (5 mg total) by mouth daily.   aspirin EC 81 MG tablet Take 1 tablet (81 mg total) by mouth daily.   atenolol (TENORMIN) 50 MG tablet Take 0.5 tablets (25 mg total) by mouth daily.   cholecalciferol (VITAMIN D3) 25 MCG (1000 UNIT) tablet Take 1,000 Units by mouth daily.   donepezil (ARICEPT) 10 MG tablet Take 1 tablet (10 mg total) by mouth at bedtime.   famotidine (PEPCID) 20 MG tablet Take 1 tablet (20 mg total) by mouth 2 (two) times daily.   finasteride (PROSCAR) 5 MG tablet Take 1 tablet (5 mg total) by mouth daily.   FLUoxetine (PROZAC) 20 MG capsule Take 1 capsule (20 mg total) by mouth daily.   Glucosamine 750 MG TABS Take by mouth.   losartan (COZAAR) 50 MG tablet Take 1 tablet (50 mg total) by mouth daily.   memantine (NAMENDA) 5 MG tablet Take 1 tablet (5 mg total) by mouth 2 (two) times daily.   Multiple Vitamins-Minerals (CENTRUM SILVER) tablet Take 1 tablet by mouth daily.   OVER THE COUNTER MEDICATION CBD Oil - takes 3 drops twice a day.   pantoprazole (PROTONIX) 40 MG tablet Take 1 tablet (40 mg total) by mouth daily.   simvastatin (ZOCOR) 10 MG tablet Take 1 tablet (10 mg total) by mouth at bedtime.   No facility-administered encounter medications on file as of 07/10/2020.   Have you had any problems recently with your health? Daughter  reports patient had some elevated blood pressures 2 weeks ago and has been checking daily. Average is running around 112/50 currently, although some days it has been up to 130s.  Have you had any problems with your pharmacy? Patient lives in [place] and has received a year worth of prescriptions per daughter.  What issues or side effects are you having with your medications? Patients daughter denies patient having any issues or current side effects from medications at this time.   What would you like me to pass along to [employer], CPP for  them to help you with?  Daughter reports patients urine output is slow. Patient reported weights are around 120-125 lb. Daughter also reports patient's appetite has improved. Patient's daughter states he is taking the Alfuzosin.  What can we do to take care of you better?  No concerns noted at this time. Daughter reports she appreciates [place] reaching out and following up today.     Star Rating Drugs:  Losartan 50 mg - last filled 03/05/20 - 90 days Simvastatin 10 mg - last filled  03/05/20 - 90 days  [place] Pharmacist Assistant [phone]

## 2020-08-16 ENCOUNTER — Telehealth: Payer: Self-pay | Admitting: Internal Medicine

## 2020-08-16 NOTE — Telephone Encounter (Signed)
Please advise- I will call her today.

## 2020-08-16 NOTE — Telephone Encounter (Signed)
Pt's daughter Murlean Iba Allegra Grana) called from [place] requesting a call back from provider or cma wanting to inform provider about new changes with pt's meds and pt's BP levels and other concerns ([name] mentioned about pt's demansia)  Please contact daughter at [phone] ASAP.

## 2020-08-16 NOTE — Telephone Encounter (Signed)
Needs office visit.  Unfortunately if he is out  [place] I cannot do a virtual. If he is in [place], recommend to see local MD

## 2020-08-16 NOTE — Telephone Encounter (Signed)
LMOM informing Shalini of PCP recommendations.

## 2020-08-16 NOTE — Telephone Encounter (Signed)
Unable to do a virtual visit

## 2020-08-16 NOTE — Telephone Encounter (Signed)
Spoke w/ Pt's daughter Murlean Iba- I informed her of PCP recommendation. She states that she is unsure if her father is going to stay in [place] or not. She wanted to set up a virtual visit for Pt's worsening dementia to discuss w/ Dr. [name]. I informed her that we are no longer allowed to do virtual visits if Pt is outside of the state, she is willing to pay out of pocket for 1 virtual visit if she is able to- I informed her I didn't know if this was possible- had to also be approved by Dr. [name] and wasn't sure if he would even agree to do this. I informed I'd speak w/ PCP and office manager and call back next week.

## 2020-09-17 ENCOUNTER — Telehealth: Payer: Self-pay

## 2020-09-17 NOTE — Progress Notes (Signed)
Chronic Care Management Pharmacy Assistant   Name: [name]  MRN: [id number] DOB: 10/17/1926  Reason for Encounter: Disease State Hypertension    Recent office visits:  None noted   Recent consult visits:  None noted   Hospital visits:  None in previous 6 months  Medications: Outpatient Encounter Medications as of 09/17/2020  Medication Sig   alfuzosin (UROXATRAL) 10 MG 24 hr tablet Take 10 mg by mouth daily with breakfast. (Patient not taking: Reported on 05/27/2020)   AMBULATORY NON FORMULARY MEDICATION Medication Name: Abdominal Binder Dx: Ventral Hernia (K43.9)   amLODipine (NORVASC) 5 MG tablet Take 1 tablet (5 mg total) by mouth daily.   aspirin EC 81 MG tablet Take 1 tablet (81 mg total) by mouth daily.   atenolol (TENORMIN) 50 MG tablet Take 0.5 tablets (25 mg total) by mouth daily.   cholecalciferol (VITAMIN D3) 25 MCG (1000 UNIT) tablet Take 1,000 Units by mouth daily.   donepezil (ARICEPT) 10 MG tablet Take 1 tablet (10 mg total) by mouth at bedtime.   famotidine (PEPCID) 20 MG tablet Take 1 tablet (20 mg total) by mouth 2 (two) times daily.   finasteride (PROSCAR) 5 MG tablet Take 1 tablet (5 mg total) by mouth daily.   FLUoxetine (PROZAC) 20 MG capsule Take 1 capsule (20 mg total) by mouth daily.   Glucosamine 750 MG TABS Take by mouth.   losartan (COZAAR) 50 MG tablet Take 1 tablet (50 mg total) by mouth daily.   memantine (NAMENDA) 5 MG tablet Take 1 tablet (5 mg total) by mouth 2 (two) times daily.   Multiple Vitamins-Minerals (CENTRUM SILVER) tablet Take 1 tablet by mouth daily.   OVER THE COUNTER MEDICATION CBD Oil - takes 3 drops twice a day.   pantoprazole (PROTONIX) 40 MG tablet Take 1 tablet (40 mg total) by mouth daily.   simvastatin (ZOCOR) 10 MG tablet Take 1 tablet (10 mg total) by mouth at bedtime.   No facility-administered encounter medications on file as of 09/17/2020.   Reviewed chart prior to disease state call. Spoke with patient  regarding BP  Recent Office Vitals: BP Readings from Last 3 Encounters:  02/22/20 (!) 144/63  05/25/19 (!) 151/59  05/05/19 (!) 135/44   Pulse Readings from Last 3 Encounters:  02/22/20 72  05/25/19 61  05/05/19 (!) 53    Wt Readings from Last 3 Encounters:  02/22/20 132 lb 12.8 oz (60.2 kg)  05/25/19 124 lb 4 oz (56.4 kg)  05/05/19 125 lb 4 oz (56.8 kg)     Kidney Function Lab Results  Component Value Date/Time   CREATININE 1.27 02/22/2020 11:39 AM   CREATININE 1.07 02/13/2019 02:57 PM   CREATININE 0.79 01/22/2012 03:51 PM   CREATININE 0.83 12/12/2010 02:54 PM   GFR 49.34 (L) 02/22/2020 11:39 AM   GFRNONAA >60 04/10/2015 03:25 AM   GFRAA >60 04/10/2015 03:25 AM    BMP Latest Ref Rng & Units 02/22/2020 02/13/2019 08/03/2018  Glucose 70 - 99 mg/dL 60(L) 152(H) 111(H)  BUN 6 - 23 mg/dL 27(H) 12 14  Creatinine 0.40 - 1.50 mg/dL 1.27 1.07 1.19  Sodium 135 - 145 mEq/L 134(L) 132(L) 132(L)  Potassium 3.5 - 5.1 mEq/L 4.4 3.8 4.5  Chloride 96 - 112 mEq/L 101 98 98  CO2 19 - 32 mEq/L '28 26 25  '$ Calcium 8.4 - 10.5 mg/dL 9.1 8.7 9.6    Current antihypertensive regimen:  Amlodipine '5mg'$  daily Atenolol '50mg'$  1/2 tab daily Losartan '50mg'$  daily  How often are you checking your Blood Pressure? daily Current home BP readings:  9/11 '@127'$ /63 8/28 '@130'$ /66 8/23 '@123'$ /65 8/22 '@134'$ /66 Patient's daughter states that she has not taken his b/p recently and these are the most recent readings.   What recent interventions/DTPs have been made by any provider to improve Blood Pressure control since last CPP Visit: N/A Any recent hospitalizations or ED visits since last visit with CPP? No What diet changes have been made to improve Blood Pressure Control?  Spoke with patient's daughter [name] and she states that he eats well, 3 meals a day, and variety of different food groups.  What exercise is being done to improve your Blood Pressure Control?  Patient's daughter states that he walks 50 laps  around the house in his walker after every meal.   Adherence Review: Is the patient currently on ACE/ARB medication? Yes Does the patient have >5 day gap between last estimated fill dates? No  Misc. Comment: Spoke with patient's daughter [name]. She states that her father's dementia has worsened over the past two months. He continues to have hallucinations, about 2 episodes a day. [name] stated that she took him to a physiatrist that prescribed Risperidone; Sig .05 2 times nightly, and that has helped him stay calmer during episodes. Risperidone was originally prescribed for .34 but patient's daughter states that it was not helping him so the prescriber increased his dosage. Patient's daughter also states that she stopped his Fluoxetine about a week ago after weaning him off of the medication, per instructions from a provider. Patient's daughter states that in addition to his medications, patient is taking 2-3 tylenol a day for arthritis. Overall [name] states that she appreciates the call and would like for me to send an e-mail to her with my name and contact information, Which has been sent.    Star Rating Drugs: simvastatin (ZOCOR) 10 MG tablet last fill date 07/31/20 90 DS losartan (COZAAR) 50 MG tablet last fill date 7/27 90 DS Confirmed these refill dates with [facility]  [name], [place]

## 2020-09-24 ENCOUNTER — Other Ambulatory Visit: Payer: Self-pay | Admitting: Internal Medicine

## 2020-11-18 ENCOUNTER — Ambulatory Visit: Payer: Medicare Other | Admitting: Pharmacist

## 2020-11-18 DIAGNOSIS — I10 Essential (primary) hypertension: Secondary | ICD-10-CM

## 2020-11-18 DIAGNOSIS — F32A Depression, unspecified: Secondary | ICD-10-CM

## 2020-11-18 NOTE — Chronic Care Management (AMB) (Signed)
Care Management   Pharmacy Note  11/18/2020 Name: [name] MRN: [id number] DOB: 1926/11/16  Subjective: [name] is a 87 y.o. year old male who is a primary care patient of Colon Branch, MD. The Care Management team was consulted for assistance with care management and care coordination needs.    Engaged with patient's daughter  for follow up visit in response to provider referral for pharmacy case management and/or care coordination services.   The patient was given information about Care Management services today including:  Care Management services includes personalized support from designated clinical staff supervised by the patient's primary care provider, including individualized plan of care and coordination with other care providers. 24/7 contact phone numbers for assistance for urgent and routine care needs. The patient may stop case management services at any time by phone call to the office staff.  Patient agreed to services and consent obtained.  Assessment:  Review of patient status, including review of consultants reports, laboratory and other test data, was performed as part of comprehensive evaluation and provision of chronic care management services.   SDOH (Social Determinants of Health) assessments and interventions performed:    Objective:  Lab Results  Component Value Date   CREATININE 1.27 02/22/2020   CREATININE 1.07 02/13/2019   CREATININE 1.19 08/03/2018    Lab Results  Component Value Date   HGBA1C 5.8 02/22/2020       Component Value Date/Time   CHOL 144 02/22/2020 1139   TRIG 204.0 (H) 02/22/2020 1139   HDL 45.50 02/22/2020 1139   CHOLHDL 3 02/22/2020 1139   VLDL 40.8 (H) 02/22/2020 1139   LDLCALC 37 08/03/2018 0919   LDLDIRECT 78.0 02/22/2020 1139    Other: (TSH, CBC, Vit D, etc.)  Clinical ASCVD: Yes  The ASCVD Risk score (Arnett DK, et al., 2019) failed to calculate for the following reasons:   The 2019 ASCVD risk score  is only valid for ages 78 to 53   The patient has a prior MI or stroke diagnosis    Other: (CHADS2VASc if Afib, PHQ9 if depression, MMRC or CAT for COPD, ACT, DEXA)  BP Readings from Last 3 Encounters:  02/22/20 (!) 144/63  05/25/19 (!) 151/59  05/05/19 (!) 135/44    Care Plan  Allergies  Allergen Reactions   Amoxicillin Other (See Comments)    "loose motions" "giddiness" Has patient had a PCN reaction causing immediate rash, facial/tongue/throat swelling, SOB or lightheadedness with hypotension: No Has patient had a PCN reaction causing severe rash involving mucus membranes or skin necrosis: No Has patient had a PCN reaction that required hospitalization No Has patient had a PCN reaction occurring within the last 10 years: No If all of the above answers are "NO", then may proceed with Cephalosporin use.     Medications Reviewed Today     Reviewed by Cherre Robins, PharmD (Pharmacist) on 05/27/20 at 1406  Med List Status: <None>   Medication Order Taking? Sig Documenting Provider Last Dose Status Informant  alfuzosin (UROXATRAL) 10 MG 24 hr tablet [id number] No Take 10 mg by mouth daily with breakfast.  Patient not taking: Reported on 05/27/2020   [provider] Not Taking Active   Camillo Flaming MEDICATION [id number]  Medication Name: Abdominal Binder Dx: Ventral Hernia (K43.9) Colon Branch, MD  Active Self  amLODipine (NORVASC) 5 MG tablet [id number] Yes Take 1 tablet (5 mg total) by mouth daily. Colon Branch, MD Taking Active   aspirin EC 81  MG tablet [id number] Yes Take 1 tablet (81 mg total) by mouth daily. Colon Branch, MD Taking Active   atenolol (TENORMIN) 50 MG tablet [id number] Yes Take 0.5 tablets (25 mg total) by mouth daily. Colon Branch, MD Taking Active   cholecalciferol (VITAMIN D3) 25 MCG (1000 UNIT) tablet [id number] Yes Take 1,000 Units by mouth daily. [provider] Taking Active   donepezil (ARICEPT) 10 MG tablet [id number] Yes Take 1  tablet (10 mg total) by mouth at bedtime. Colon Branch, MD Taking Active   famotidine (PEPCID) 20 MG tablet [id number] Yes Take 1 tablet (20 mg total) by mouth 2 (two) times daily. Colon Branch, MD Taking Active   finasteride (PROSCAR) 5 MG tablet [id number] Yes Take 1 tablet (5 mg total) by mouth daily. Colon Branch, MD Taking Active   FLUoxetine (PROZAC) 20 MG capsule [id number] Yes Take 1 capsule (20 mg total) by mouth daily. Colon Branch, MD Taking Active   Glucosamine 750 MG TABS [id number] Yes Take by mouth. [provider] Taking Active   losartan (COZAAR) 50 MG tablet [id number] Yes Take 1 tablet (50 mg total) by mouth daily. Colon Branch, MD Taking Active   memantine [facility]) 5 MG tablet [id number] Yes Take 1 tablet (5 mg total) by mouth 2 (two) times daily. Colon Branch, MD Taking Active   Multiple Vitamins-Minerals (CENTRUM SILVER) tablet [id number] Yes Take 1 tablet by mouth daily. [provider] Taking Active Self  OVER THE COUNTER MEDICATION [id number] Yes CBD Oil - takes 3 drops twice a day. [provider] Taking Active   pantoprazole (PROTONIX) 40 MG tablet [id number] Yes Take 1 tablet (40 mg total) by mouth daily. Colon Branch, MD Taking Active   simvastatin (ZOCOR) 10 MG tablet [id number] Yes Take 1 tablet (10 mg total) by mouth at bedtime. Colon Branch, MD Taking Active             Patient Active Problem List   Diagnosis Date Noted   Vitamin D deficiency 05/25/2019   Polyneuropathy, per NCS (2007) 05/02/2017   Left inguinal hernia 10/12/2016   PCP NOTES >>>>>> 12/18/2014   Incisional hernia, without obstruction or gangrene 09/03/2014   Ventral hernia 08/21/2014   Nonspecific elevation of levels of transaminase or lactic acid dehydrogenase (LDH) 08/20/2013   Annual physical exam 05/10/2011   Anxiety state 03/26/2008   Hyperglycemia 06/02/2007   Hyperlipidemia 06/02/2007   CVA 03/10/2007   INTERNAL HEMORRHOIDS 03/10/2007   Anemia 08/24/2006   DJD  (degenerative joint disease) 08/24/2006   COLONIC POLYPS 06/28/2006   Essential hypertension 05/17/2006   ALLERGIC RHINITIS 05/17/2006   ESOPHAGEAL STRICTURE 05/17/2006   GERD 05/17/2006   BPH (benign prostatic hyperplasia)--sees urology 05/17/2006    Conditions to be addressed/monitored: HTN, HLD, Dementia, and BPH  Care Plan : General Pharmacy (Adult)  Updates made by Cherre Robins, RPH-CPP since 11/18/2020 12:00 AM     Problem: Medication management and care management for HTN; HDL; pre DM; BPH; depression; DJD; GERD; low vitamin D   Priority: High  Onset Date: 05/27/2020  Note:   Current Barriers:  Unable to maintain control of HTN and hyperlipidemia Does not adhere to prescribed medication regimen Does not maintain contact with provider office Patient is currently living in [place] with his daughter. Has been challenging to get back for medical appointments and to get medications (they are using mailorder now)  Pharmacist Clinical Goal(s):  Over the next 90 days, patient will achieve  adherence to monitoring guidelines and medication adherence to achieve therapeutic efficacy achieve control of HTN and hyperlipidemia as evidenced by BP <140 / 90 and LDL <70 contact provider office for questions/concerns as evidenced notation of same in electronic health record through collaboration with PharmD and provider.   Interventions: 1:1 collaboration with Colon Branch, MD regarding development and update of comprehensive plan of care as evidenced by provider attestation and co-signature Inter-disciplinary care team collaboration (see longitudinal plan of care) Comprehensive medication review performed; medication list updated in electronic medical record  Hypertension Improved per home blood glucose readings; BP goal <140/90 Home blood pressure: 128/78; 132/80; 122/78 Current regimen:  Amlodipine 5mg  daily Atenolol 50mg  1/2 tab daily Losartan 50mg  daily  Interventions: Discussed  blood pressure goal Recommended purchase new blood pressure cuff, check blood pressure 1 to 2 times per week, document, and provide at future appointments Ensure daily salt intake < 2300 mg/day Continue current therapy for blood pressure  Hyperlipidemia Lab Results  Component Value Date/Time   LDLDIRECT 78.0 02/22/2020 11:39 AM  Not at goal of LDL goal < 70 Current regimen:  Simvastatin 10mg  daily at bedtime Interventions: Continue with dietary changes over last few months - limiting sweets and saturated fat Consider rechecking lipids at next office visit; If LDL still >70 could consider increasing simvastatin to 20mg  daily     Pre-Diabetes Lab Results  Component Value Date/Time   HGBA1C 5.8 02/22/2020 11:39 AM   HGBA1C 5.7 08/03/2018 09:19 AM  Current at goal of A1c <6.5% Current regimen:  Diet and exercise management   Interventions: Continue current diet - limiting intake of sugar and satruated fat Maintain a1c <6.5%  BPH / Prostate: Goal is to decrease night time urination and urinary hesitancy Daughter reports that patient gets up 1 or 2 times per night Current regimen:  Alfuzosin 10mg  daily (on med list but daughter states patient has not been taking) Finasteride 5mg  daily  Past medications - tamsulosin stopped due to dizziness.  Interventions: Coordinated with urology office to verify above regimen for BPH Restart Alfuzosin 10mg  - take 1 tablet daily  Monitor blood pressure and dizziness.   Depression:  Patient's wife died about 9 months ago.  He is living with his family in [place] and hoping to gain citizenship.  Current Therapy:  Fluoxetine 20mg  daily  Daughter mentions that patient's weight this morning was 122lbs (this is about 10 lbs lower than in Feb 2022). She states that patient is eating fewer sweets and more vegetables. She feels that his mood is good and not affecting his appetite.  Interventions:  Recommended daughter continue to monitor weight 2 to  3 times per week. If weight continues to decrease should have workup by physician.  Could consider mirtazapine is appetite decreased and continue weight loss.   Medication management Current pharmacy: [facility] Interventions Comprehensive medication review performed. Continue current medication management strategy Educated about and recommended using over the counter benefits available with [employer] benefits   Patient Goals/Self-Care Activities Over the next 90 days, patient will:  take medications as prescribed and check blood pressure 1 to 2 times per week, document, and provide at future appointments  Follow Up Plan: No further follow up required: patient has established with PCP in [place] where he lives with his daughter.          Medication Assistance:  None required.  Patient affirms current coverage meets needs.  Follow Up:  No follow up needed - has changed  PCP / lives with daughter in [place].   Plan: No further follow up required: lives with daughter in [place] now  Cherre Robins, PharmD Clinical Chartered certified accountant [facility]

## 2020-11-18 NOTE — Progress Notes (Signed)
I have personally reviewed this encounter including the documentation in this note and have collaborated with the care management provider regarding care management and care coordination activities to include development and update of the comprehensive care plan. I am certifying that I agree with the content of this note and encounter as supervising physician.  Kathlene November, MD

## 2020-12-07 ENCOUNTER — Other Ambulatory Visit: Payer: Self-pay | Admitting: Internal Medicine

## 2021-01-06 ENCOUNTER — Other Ambulatory Visit: Payer: Self-pay | Admitting: Internal Medicine

## 2021-09-21 IMAGING — DX DG THORACIC SPINE 3V
3 series · 3 of 3 positions shown · non-contrast
Comparison: None.

CLINICAL DATA: Back pain x2 weeks.

EXAM:
THORACIC SPINE - 3 VIEWS

[t-spine ap]
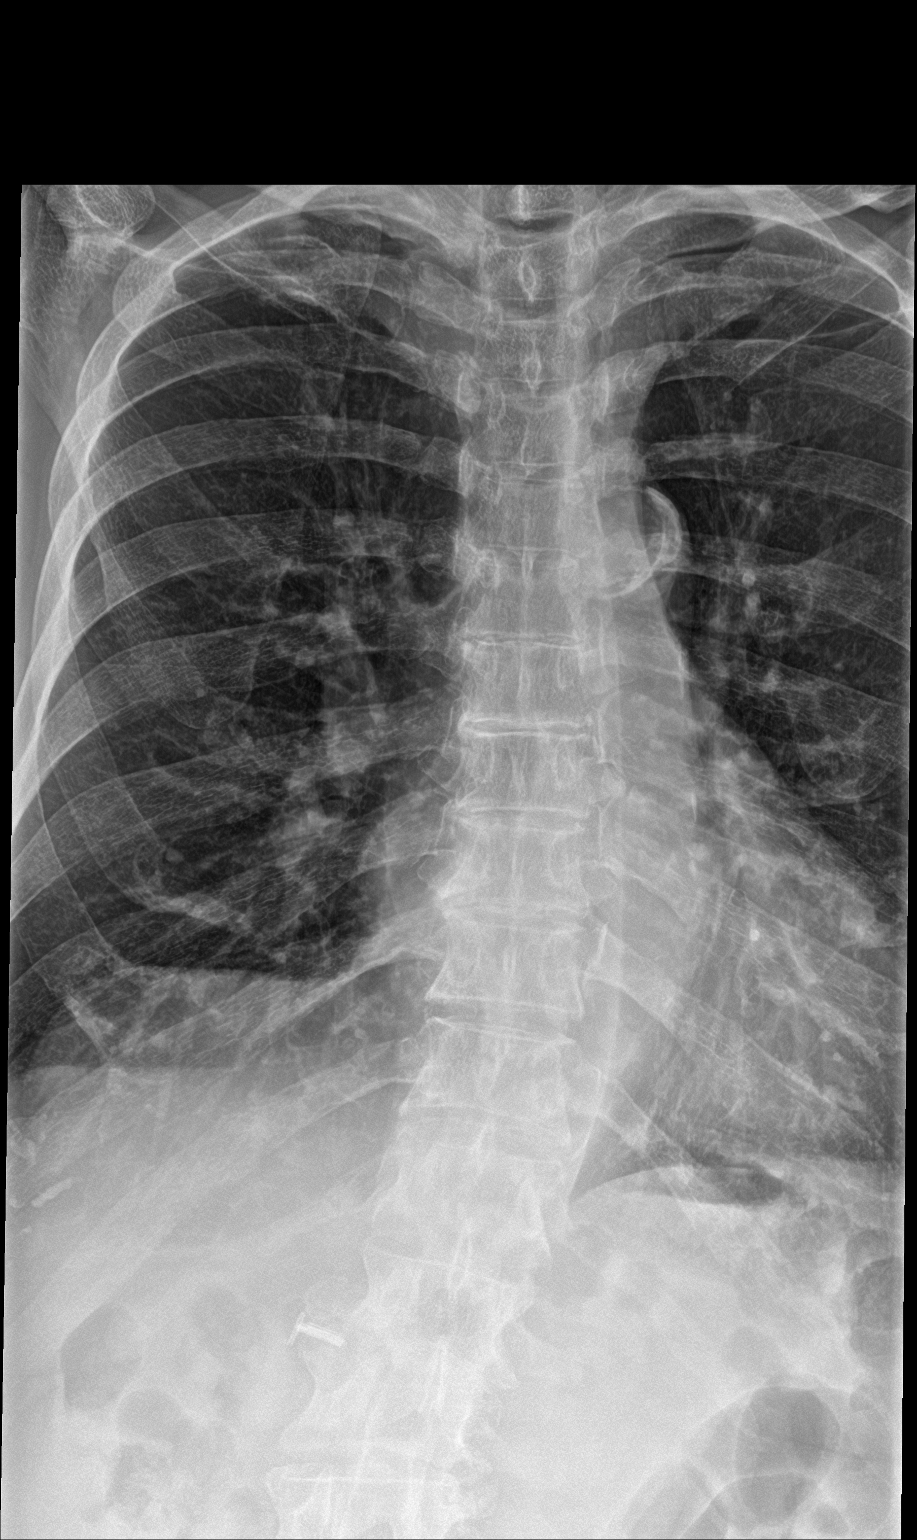

[t-spine lat]
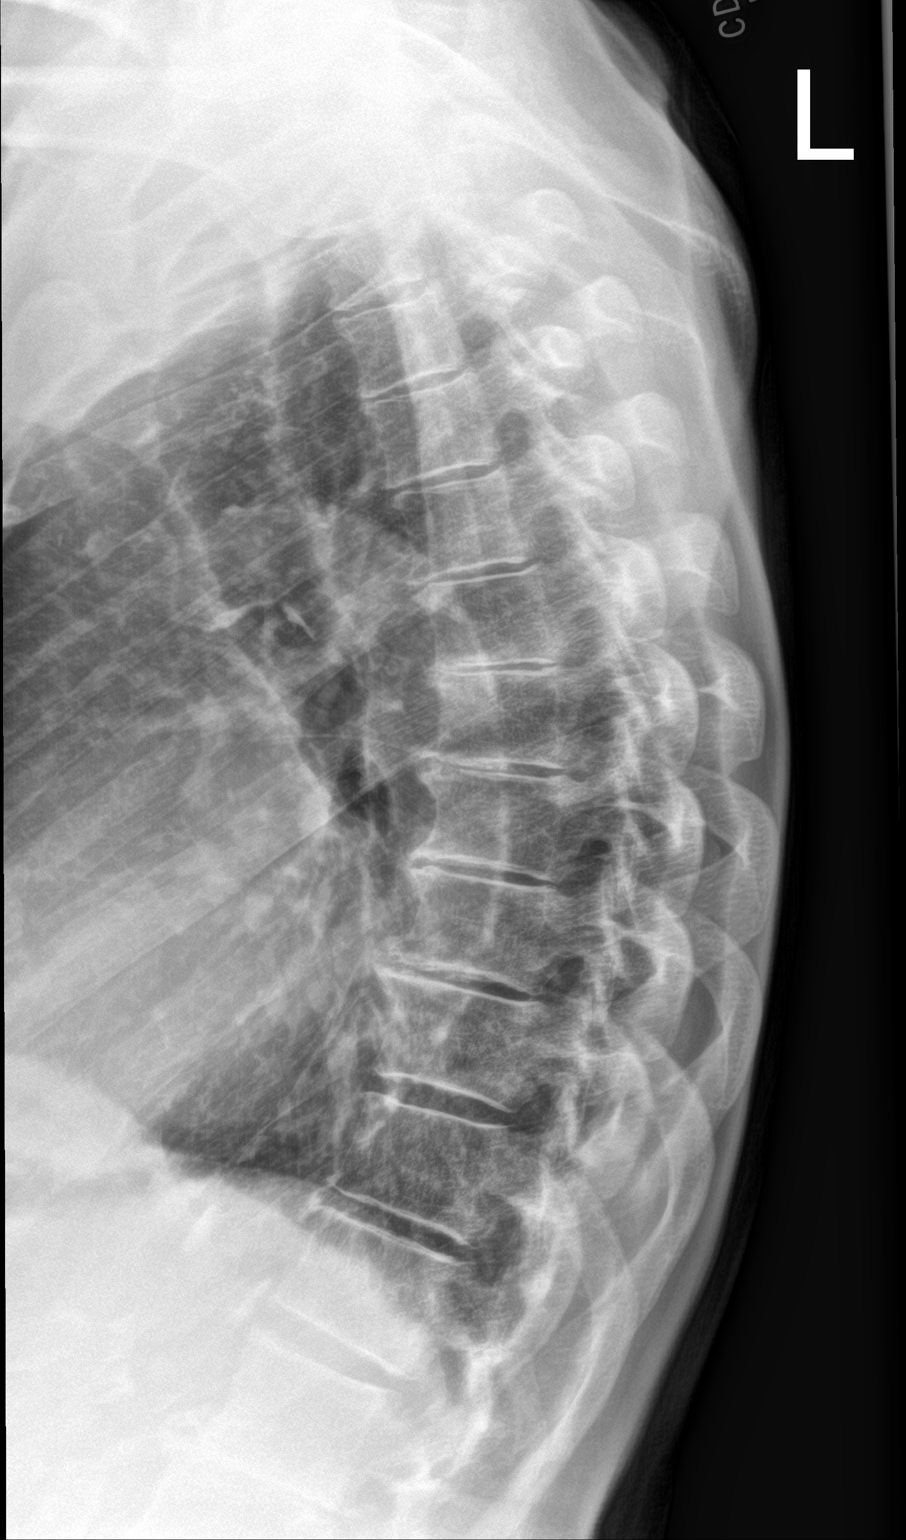

[t-spine swimmers]
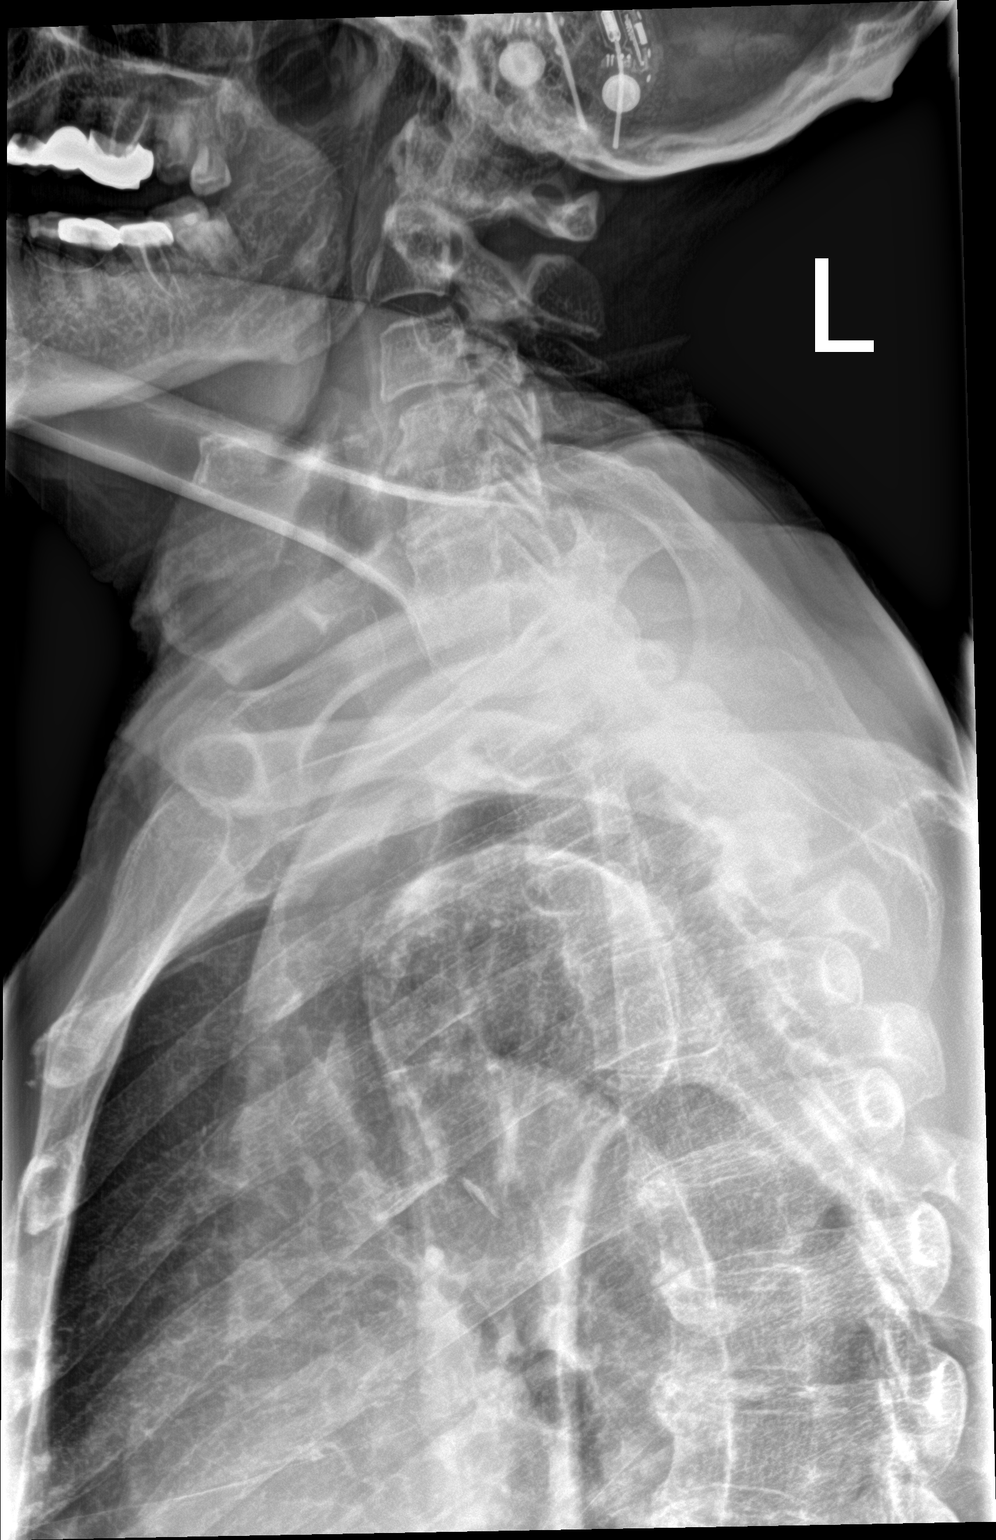

[3 of 3 positions shown; findings below may reference images not displayed]

FINDINGS: There is no evidence of thoracic spine fracture. Alignment is
normal. Mild to moderate severity dextroscoliosis of the visualized
portion of the upper lumbar spine is seen. Mild-to-moderate severity
multilevel endplate sclerosis is seen throughout the thoracic spine.
Mild-to-moderate severity multilevel intervertebral disc space
narrowing is also seen. There is marked severity calcification of
the thoracic aorta.
IMPRESSION: Mild-to-moderate severity multilevel degenerative changes as
described above.

## 2022-03-06 DEATH — deceased
